# Patient Record
Sex: Female | Born: 1955 | Race: Black or African American | Hispanic: No | Marital: Single | Smoking: Former smoker
Health system: Southern US, Community
[De-identification: ages and names within clinical notes are randomized; demographics above are authoritative.]

## PROBLEM LIST (undated history)

## (undated) DIAGNOSIS — H269 Unspecified cataract: Secondary | ICD-10-CM

## (undated) DIAGNOSIS — D649 Anemia, unspecified: Secondary | ICD-10-CM

## (undated) DIAGNOSIS — T7840XA Allergy, unspecified, initial encounter: Secondary | ICD-10-CM

## (undated) DIAGNOSIS — J302 Other seasonal allergic rhinitis: Secondary | ICD-10-CM

## (undated) DIAGNOSIS — E039 Hypothyroidism, unspecified: Secondary | ICD-10-CM

## (undated) DIAGNOSIS — G709 Myoneural disorder, unspecified: Secondary | ICD-10-CM

## (undated) DIAGNOSIS — R011 Cardiac murmur, unspecified: Secondary | ICD-10-CM

## (undated) HISTORY — DX: Hypothyroidism, unspecified: E03.9

## (undated) HISTORY — PX: BREAST BIOPSY: SHX20

## (undated) HISTORY — DX: Allergy, unspecified, initial encounter: T78.40XA

## (undated) HISTORY — PX: BREAST EXCISIONAL BIOPSY: SUR124

## (undated) HISTORY — DX: Anemia, unspecified: D64.9

## (undated) HISTORY — DX: Myoneural disorder, unspecified: G70.9

## (undated) HISTORY — PX: BREAST LUMPECTOMY: SHX2

## (undated) HISTORY — PX: TONSILLECTOMY: SUR1361

## (undated) HISTORY — DX: Unspecified cataract: H26.9

## (undated) HISTORY — DX: Cardiac murmur, unspecified: R01.1

---

## 1997-10-26 ENCOUNTER — Emergency Department (HOSPITAL_COMMUNITY): Admission: EM | Admit: 1997-10-26 | Discharge: 1997-10-26 | Payer: Self-pay | Admitting: Emergency Medicine

## 1998-10-22 ENCOUNTER — Other Ambulatory Visit: Admission: RE | Admit: 1998-10-22 | Discharge: 1998-10-22 | Payer: Self-pay | Admitting: *Deleted

## 1999-01-06 ENCOUNTER — Other Ambulatory Visit: Admission: RE | Admit: 1999-01-06 | Discharge: 1999-01-06 | Payer: Self-pay | Admitting: *Deleted

## 1999-03-05 ENCOUNTER — Encounter: Payer: Self-pay | Admitting: Family Medicine

## 1999-03-05 ENCOUNTER — Encounter: Admission: RE | Admit: 1999-03-05 | Discharge: 1999-03-05 | Payer: Self-pay | Admitting: Family Medicine

## 1999-03-19 ENCOUNTER — Encounter: Payer: Self-pay | Admitting: Internal Medicine

## 1999-03-19 ENCOUNTER — Encounter: Admission: RE | Admit: 1999-03-19 | Discharge: 1999-03-19 | Payer: Self-pay | Admitting: Internal Medicine

## 1999-04-13 ENCOUNTER — Other Ambulatory Visit: Admission: RE | Admit: 1999-04-13 | Discharge: 1999-04-13 | Payer: Self-pay | Admitting: *Deleted

## 1999-11-01 ENCOUNTER — Emergency Department (HOSPITAL_COMMUNITY): Admission: EM | Admit: 1999-11-01 | Discharge: 1999-11-01 | Payer: Self-pay | Admitting: Emergency Medicine

## 2000-03-09 ENCOUNTER — Encounter: Payer: Self-pay | Admitting: *Deleted

## 2000-03-09 ENCOUNTER — Encounter: Admission: RE | Admit: 2000-03-09 | Discharge: 2000-03-09 | Payer: Self-pay | Admitting: *Deleted

## 2000-03-31 ENCOUNTER — Other Ambulatory Visit: Admission: RE | Admit: 2000-03-31 | Discharge: 2000-03-31 | Payer: Self-pay | Admitting: *Deleted

## 2001-03-20 ENCOUNTER — Encounter: Admission: RE | Admit: 2001-03-20 | Discharge: 2001-03-20 | Payer: Self-pay | Admitting: Internal Medicine

## 2001-03-20 ENCOUNTER — Encounter: Payer: Self-pay | Admitting: Internal Medicine

## 2001-03-29 ENCOUNTER — Encounter: Admission: RE | Admit: 2001-03-29 | Discharge: 2001-03-29 | Payer: Self-pay | Admitting: Internal Medicine

## 2001-03-29 ENCOUNTER — Encounter: Payer: Self-pay | Admitting: Internal Medicine

## 2001-03-31 ENCOUNTER — Encounter: Payer: Self-pay | Admitting: Internal Medicine

## 2001-03-31 ENCOUNTER — Encounter: Admission: RE | Admit: 2001-03-31 | Discharge: 2001-03-31 | Payer: Self-pay | Admitting: Internal Medicine

## 2001-03-31 ENCOUNTER — Encounter (INDEPENDENT_AMBULATORY_CARE_PROVIDER_SITE_OTHER): Payer: Self-pay | Admitting: *Deleted

## 2001-04-06 ENCOUNTER — Other Ambulatory Visit: Admission: RE | Admit: 2001-04-06 | Discharge: 2001-04-06 | Payer: Self-pay | Admitting: *Deleted

## 2001-07-29 ENCOUNTER — Emergency Department (HOSPITAL_COMMUNITY): Admission: EM | Admit: 2001-07-29 | Discharge: 2001-07-29 | Payer: Self-pay | Admitting: Emergency Medicine

## 2002-03-21 ENCOUNTER — Encounter: Admission: RE | Admit: 2002-03-21 | Discharge: 2002-03-21 | Payer: Self-pay | Admitting: Internal Medicine

## 2002-03-21 ENCOUNTER — Encounter: Payer: Self-pay | Admitting: Internal Medicine

## 2002-03-22 ENCOUNTER — Encounter: Admission: RE | Admit: 2002-03-22 | Discharge: 2002-03-22 | Payer: Self-pay | Admitting: Internal Medicine

## 2002-03-22 ENCOUNTER — Encounter: Payer: Self-pay | Admitting: Internal Medicine

## 2003-01-08 ENCOUNTER — Emergency Department (HOSPITAL_COMMUNITY): Admission: EM | Admit: 2003-01-08 | Discharge: 2003-01-08 | Payer: Self-pay

## 2003-03-25 ENCOUNTER — Encounter: Admission: RE | Admit: 2003-03-25 | Discharge: 2003-03-25 | Payer: Self-pay | Admitting: Internal Medicine

## 2004-03-31 ENCOUNTER — Encounter: Admission: RE | Admit: 2004-03-31 | Discharge: 2004-03-31 | Payer: Self-pay | Admitting: Internal Medicine

## 2004-10-14 ENCOUNTER — Encounter: Admission: RE | Admit: 2004-10-14 | Discharge: 2004-10-14 | Payer: Self-pay | Admitting: Orthopedic Surgery

## 2005-04-02 ENCOUNTER — Encounter: Admission: RE | Admit: 2005-04-02 | Discharge: 2005-04-02 | Payer: Self-pay | Admitting: Internal Medicine

## 2005-04-21 ENCOUNTER — Encounter: Admission: RE | Admit: 2005-04-21 | Discharge: 2005-04-21 | Payer: Self-pay | Admitting: Internal Medicine

## 2005-06-19 ENCOUNTER — Emergency Department (HOSPITAL_COMMUNITY): Admission: EM | Admit: 2005-06-19 | Discharge: 2005-06-19 | Payer: Self-pay | Admitting: Family Medicine

## 2006-04-05 ENCOUNTER — Encounter: Admission: RE | Admit: 2006-04-05 | Discharge: 2006-04-05 | Payer: Self-pay | Admitting: Internal Medicine

## 2006-10-04 ENCOUNTER — Emergency Department (HOSPITAL_COMMUNITY): Admission: EM | Admit: 2006-10-04 | Discharge: 2006-10-04 | Payer: Self-pay | Admitting: Emergency Medicine

## 2007-04-27 ENCOUNTER — Encounter: Admission: RE | Admit: 2007-04-27 | Discharge: 2007-04-27 | Payer: Self-pay | Admitting: Internal Medicine

## 2008-04-30 ENCOUNTER — Encounter: Admission: RE | Admit: 2008-04-30 | Discharge: 2008-04-30 | Payer: Self-pay | Admitting: Obstetrics and Gynecology

## 2009-05-01 ENCOUNTER — Encounter: Admission: RE | Admit: 2009-05-01 | Discharge: 2009-05-01 | Payer: Self-pay | Admitting: Internal Medicine

## 2010-05-05 ENCOUNTER — Encounter
Admission: RE | Admit: 2010-05-05 | Discharge: 2010-05-05 | Payer: Self-pay | Source: Home / Self Care | Attending: Internal Medicine | Admitting: Internal Medicine

## 2010-06-06 ENCOUNTER — Encounter: Payer: Self-pay | Admitting: Internal Medicine

## 2011-01-12 ENCOUNTER — Emergency Department (HOSPITAL_COMMUNITY): Payer: Federal, State, Local not specified - PPO

## 2011-01-12 ENCOUNTER — Emergency Department (HOSPITAL_COMMUNITY)
Admission: EM | Admit: 2011-01-12 | Discharge: 2011-01-12 | Disposition: A | Discharge status: Home / Self Care | Payer: Federal, State, Local not specified - PPO | Attending: Emergency Medicine | Admitting: Emergency Medicine

## 2011-01-12 DIAGNOSIS — Z79899 Other long term (current) drug therapy: Secondary | ICD-10-CM | POA: Insufficient documentation

## 2011-01-12 DIAGNOSIS — E039 Hypothyroidism, unspecified: Secondary | ICD-10-CM | POA: Insufficient documentation

## 2011-01-12 DIAGNOSIS — E785 Hyperlipidemia, unspecified: Secondary | ICD-10-CM | POA: Insufficient documentation

## 2011-01-12 DIAGNOSIS — R51 Headache: Secondary | ICD-10-CM | POA: Insufficient documentation

## 2011-01-12 LAB — POCT I-STAT, CHEM 8
BUN: 17 mg/dL (ref 6–23)
Calcium, Ion: 1.15 mmol/L (ref 1.12–1.32)
Chloride: 108 mEq/L (ref 96–112)
Creatinine, Ser: 0.8 mg/dL (ref 0.50–1.10)
Glucose, Bld: 96 mg/dL (ref 70–99)
HCT: 36 % (ref 36.0–46.0)
Hemoglobin: 12.2 g/dL (ref 12.0–15.0)
Potassium: 4.2 mEq/L (ref 3.5–5.1)
Sodium: 142 mEq/L (ref 135–145)
TCO2: 25 mmol/L (ref 0–100)

## 2011-01-12 LAB — SEDIMENTATION RATE: Sed Rate: 22 mm/hr (ref 0–22)

## 2011-04-13 ENCOUNTER — Other Ambulatory Visit: Payer: Self-pay | Admitting: Obstetrics and Gynecology

## 2011-04-13 DIAGNOSIS — Z1231 Encounter for screening mammogram for malignant neoplasm of breast: Secondary | ICD-10-CM

## 2011-05-07 ENCOUNTER — Ambulatory Visit
Admission: RE | Admit: 2011-05-07 | Discharge: 2011-05-07 | Disposition: A | Discharge status: Home / Self Care | Payer: Federal, State, Local not specified - PPO | Source: Ambulatory Visit | Attending: Obstetrics and Gynecology | Admitting: Obstetrics and Gynecology

## 2011-05-07 DIAGNOSIS — Z1231 Encounter for screening mammogram for malignant neoplasm of breast: Secondary | ICD-10-CM

## 2011-12-29 ENCOUNTER — Encounter (HOSPITAL_COMMUNITY): Payer: Self-pay | Admitting: *Deleted

## 2011-12-29 ENCOUNTER — Emergency Department (HOSPITAL_COMMUNITY)
Admission: EM | Admit: 2011-12-29 | Discharge: 2011-12-29 | Disposition: A | Discharge status: Home / Self Care | Payer: Federal, State, Local not specified - PPO | Attending: Emergency Medicine | Admitting: Emergency Medicine

## 2011-12-29 ENCOUNTER — Emergency Department (HOSPITAL_COMMUNITY): Payer: Federal, State, Local not specified - PPO

## 2011-12-29 DIAGNOSIS — K6389 Other specified diseases of intestine: Secondary | ICD-10-CM | POA: Insufficient documentation

## 2011-12-29 DIAGNOSIS — F17200 Nicotine dependence, unspecified, uncomplicated: Secondary | ICD-10-CM | POA: Insufficient documentation

## 2011-12-29 DIAGNOSIS — E079 Disorder of thyroid, unspecified: Secondary | ICD-10-CM | POA: Insufficient documentation

## 2011-12-29 DIAGNOSIS — K639 Disease of intestine, unspecified: Secondary | ICD-10-CM

## 2011-12-29 LAB — URINALYSIS, ROUTINE W REFLEX MICROSCOPIC
Bilirubin Urine: NEGATIVE
Glucose, UA: NEGATIVE mg/dL
Hgb urine dipstick: NEGATIVE
Ketones, ur: NEGATIVE mg/dL
Leukocytes, UA: NEGATIVE
Nitrite: NEGATIVE
Protein, ur: NEGATIVE mg/dL
Specific Gravity, Urine: 1.008 (ref 1.005–1.030)
Urobilinogen, UA: 1 mg/dL (ref 0.0–1.0)
pH: 6 (ref 5.0–8.0)

## 2011-12-29 LAB — BASIC METABOLIC PANEL
BUN: 12 mg/dL (ref 6–23)
CO2: 28 mEq/L (ref 19–32)
Calcium: 9.2 mg/dL (ref 8.4–10.5)
Chloride: 107 mEq/L (ref 96–112)
Creatinine, Ser: 0.68 mg/dL (ref 0.50–1.10)
GFR calc Af Amer: >90 mL/min (ref >90)
GFR calc non Af Amer: >90 mL/min (ref >90)
Glucose, Bld: 99 mg/dL (ref 70–99)
Potassium: 4.1 mEq/L (ref 3.5–5.1)
Sodium: 143 mEq/L (ref 135–145)

## 2011-12-29 LAB — CBC WITH DIFFERENTIAL/PLATELET
Basophils Absolute: 0 10*3/uL (ref 0.0–0.1)
Basophils Relative: 1 % (ref 0–1)
Eosinophils Absolute: 0.3 10*3/uL (ref 0.0–0.7)
Eosinophils Relative: 5 % (ref 0–5)
HCT: 36.2 % (ref 36.0–46.0)
Hemoglobin: 11.9 g/dL — ABNORMAL LOW (ref 12.0–15.0)
Lymphocytes Relative: 25 % (ref 12–46)
Lymphs Abs: 1.4 10*3/uL (ref 0.7–4.0)
MCH: 29 pg (ref 26.0–34.0)
MCHC: 32.9 g/dL (ref 30.0–36.0)
MCV: 88.3 fL (ref 78.0–100.0)
Monocytes Absolute: 0.3 10*3/uL (ref 0.1–1.0)
Monocytes Relative: 6 % (ref 3–12)
Neutro Abs: 3.5 10*3/uL (ref 1.7–7.7)
Neutrophils Relative %: 64 % (ref 43–77)
Platelets: 207 10*3/uL (ref 150–400)
RBC: 4.1 MIL/uL (ref 3.87–5.11)
RDW: 13.2 % (ref 11.5–15.5)
WBC: 5.5 10*3/uL (ref 4.0–10.5)

## 2011-12-29 MED ORDER — MAGNESIUM CITRATE PO SOLN
1.0000 | Freq: Once | ORAL | Status: DC
  Filled 2011-12-29: qty 296

## 2011-12-29 MED ORDER — POLYETHYLENE GLYCOL 3350 17 GM/SCOOP PO POWD: 17.0000 g | Freq: Two times a day (BID) | ORAL | Status: DC

## 2011-12-29 NOTE — ED Notes (Signed)
OOB to restroom

## 2011-12-29 NOTE — ED Notes (Signed)
Patient has urine sample if needed.

## 2011-12-29 NOTE — ED Provider Notes (Signed)
56-year-old female had onset yesterday of episodic left flank pain. Pain is dull and crampy and only present for a few seconds at a time. There is no associated nausea, vomiting, diarrhea. She states that her bowel movements have been regular. She does notice some relief when she passes some flatus. She denies any urinary difficulty. On exam, she has no CVA tenderness, no abdominal tenderness. CBC and urinalysis are unremarkable. KUB will be obtained to assess gas and stool burden, but pain sounds like a splenic flexure syndrome.  Dione Booze, MD 12/29/11 1011

## 2011-12-29 NOTE — ED Notes (Signed)
Patient was shopping yesterday and felt pain in left flank radiating around to left abdomen.  States "passed gas and felt better".  Pain return this am in left flank and around to left abdomen.  Denies Nausea/Vomiting or other symptoms.

## 2011-12-29 NOTE — ED Notes (Signed)
Pt. Was prescribed Mg Citrate. Pt. Refused medication here and wanted to take it at home. PA was notified.

## 2011-12-29 NOTE — ED Notes (Signed)
Pharmacy called. Sending dose.

## 2011-12-29 NOTE — ED Provider Notes (Signed)
History     CSN: [id number]  Arrival date & time 12/29/11  6440   First MD Initiated Contact with Patient 12/29/11 0845      Chief Complaint  Patient presents with  . Flank Pain    (Consider location/radiation/quality/duration/timing/severity/associated sxs/prior treatment) HPI Comments: Anita Duncan 56 y.o. female   The chief complaint is: Patient presents with:   Flank Pain   The patient has medical history significant for:   Past Medical History:   Thyroid disease                                             Patient presents with left flank pain that began yesterday. Patient describes the pain as colicky and 8/10. She states that pain is better after passing gas. Denies fever or chills. Denies NVD. Denies dysuria, urgency, or frequency. Denies vaginal discharge or odor.      Patient is a 56 y.o. female presenting with flank pain. The history is provided by the patient.  Flank Pain Pertinent negatives include no abdominal pain, chest pain, chills, fever, nausea or vomiting.    Past Medical History  Diagnosis Date  . Thyroid disease     Past Surgical History  Procedure Date  . Cesarean section   . Breast lumpectomy     History reviewed. No pertinent family history.  History  Substance Use Topics  . Smoking status: Current Everyday Smoker -- 0.5 packs/day  . Smokeless tobacco: Not on file  . Alcohol Use: 0.6 oz/week    1 Glasses of wine per week     2 x a year    OB History    Grav Para Term Preterm Abortions TAB SAB Ect Mult Living                  Review of Systems  Constitutional: Negative for fever and chills.  Respiratory: Negative for shortness of breath.   Cardiovascular: Negative for chest pain and palpitations.  Gastrointestinal: Negative for nausea, vomiting, abdominal pain and diarrhea.  Genitourinary: Positive for flank pain. Negative for dysuria, urgency, vaginal discharge and difficulty urinating.  All other systems reviewed and  are negative.    Allergies  Codeine and Sulfa antibiotics  Home Medications   Current Outpatient Rx  Name Route Sig Dispense Refill  . LEVOTHYROXINE SODIUM 137 MCG PO TABS Oral Take 137 mcg by mouth daily.    Marland Kitchen NABUMETONE 750 MG PO TABS Oral Take 750 mg by mouth 2 (two) times daily as needed. For pain      BP 131/82  Pulse 80  Temp 97.1 F (36.2 C) (Oral)  Resp 18  SpO2 97%  Physical Exam  Nursing note and vitals reviewed. Constitutional: She appears well-developed and well-nourished. No distress.  HENT:  Head: Normocephalic and atraumatic.  Mouth/Throat: Oropharynx is clear and moist.  Eyes: EOM are normal. No scleral icterus.  Cardiovascular: Normal rate, regular rhythm and normal heart sounds.   Pulmonary/Chest: Effort normal and breath sounds normal.  Abdominal: Soft. Bowel sounds are normal. There is no tenderness. There is CVA tenderness.  Neurological: She is alert.  Skin: Skin is warm and dry.    ED Course  Procedures (including critical care time)  Labs Reviewed  CBC WITH DIFFERENTIAL - Abnormal; Notable for the following:    Hemoglobin 11.9 (*)     All other components within  normal limits  URINALYSIS, ROUTINE W REFLEX MICROSCOPIC - Abnormal; Notable for the following:    APPearance CLOUDY (*)     All other components within normal limits  BASIC METABOLIC PANEL   Results for orders placed during the hospital encounter of 12/29/11  CBC WITH DIFFERENTIAL      Component Value Range   WBC 5.5  4.0 - 10.5 K/uL   RBC 4.10  3.87 - 5.11 MIL/uL   Hemoglobin 11.9 (*) 12.0 - 15.0 g/dL   HCT 62.1  30.8 - 65.7 %   MCV 88.3  78.0 - 100.0 fL   MCH 29.0  26.0 - 34.0 pg   MCHC 32.9  30.0 - 36.0 g/dL   RDW 84.6  96.2 - 95.2 %   Platelets 207  150 - 400 K/uL   Neutrophils Relative 64  43 - 77 %   Neutro Abs 3.5  1.7 - 7.7 K/uL   Lymphocytes Relative 25  12 - 46 %   Lymphs Abs 1.4  0.7 - 4.0 K/uL   Monocytes Relative 6  3 - 12 %   Monocytes Absolute 0.3  0.1 -  1.0 K/uL   Eosinophils Relative 5  0 - 5 %   Eosinophils Absolute 0.3  0.0 - 0.7 K/uL   Basophils Relative 1  0 - 1 %   Basophils Absolute 0.0  0.0 - 0.1 K/uL  URINALYSIS, ROUTINE W REFLEX MICROSCOPIC      Component Value Range   Color, Urine YELLOW  YELLOW   APPearance CLOUDY (*) CLEAR   Specific Gravity, Urine 1.008  1.005 - 1.030   pH 6.0  5.0 - 8.0   Glucose, UA NEGATIVE  NEGATIVE mg/dL   Hgb urine dipstick NEGATIVE  NEGATIVE   Bilirubin Urine NEGATIVE  NEGATIVE   Ketones, ur NEGATIVE  NEGATIVE mg/dL   Protein, ur NEGATIVE  NEGATIVE mg/dL   Urobilinogen, UA 1.0  0.0 - 1.0 mg/dL   Nitrite NEGATIVE  NEGATIVE   Leukocytes, UA NEGATIVE  NEGATIVE  BASIC METABOLIC PANEL      Component Value Range   Sodium 143  135 - 145 mEq/L   Potassium 4.1  3.5 - 5.1 mEq/L   Chloride 107  96 - 112 mEq/L   CO2 28  19 - 32 mEq/L   Glucose, Bld 99  70 - 99 mg/dL   BUN 12  6 - 23 mg/dL   Creatinine, Ser 8.41  0.50 - 1.10 mg/dL   Calcium 9.2  8.4 - 32.4 mg/dL   GFR calc non Af Amer >90  >90 mL/min   GFR calc Af Amer >90  >90 mL/min     Dg Abd Acute W/chest  12/29/2011  *RADIOLOGY REPORT*  Clinical Data: Flank pain  ACUTE ABDOMEN SERIES (ABDOMEN 2 VIEW & CHEST 1 VIEW)  Comparison: 10/04/2006  Findings: Cardiomediastinal silhouette is stable.  No acute infiltrate or pleural effusion.  No pulmonary edema.  There is nonspecific nonobstructive bowel gas pattern.  Mild lumbar dextroscoliosis.  Some colonic stool noted in the transverse and descending colon.  No free abdominal air.  No pathologic calcifications are noted.  IMPRESSION: No acute disease.  Mild lumbar dextroscoliosis.  Nonspecific nonobstructive bowel gas pattern.  No free abdominal air.  Original Report Authenticated By: Natasha Mead, M.D.     1. Splenic flexure syndrome       MDM  Patient presented with left sided flank pain that began today. UA, CBC, BMP: unremarkable. Acute abdomen series remarkable for  large amounts of stool and  air.Patient will be given magnesium citrate and informed that it can cause diarrhea. Patient will be given recommendations on increasing fiber to diet. No red flags for pyelonephritis, nephrolithiasis, or other more serious abdominal etiology. Patient given return precautions verbally and in discharge instructions.         Pixie Casino, PA-C 12/29/11 1551

## 2011-12-30 NOTE — ED Provider Notes (Signed)
Medical screening examination/treatment/procedure(s) were conducted as a shared visit with non-physician practitioner(s) and myself.  I personally evaluated the patient during the encounter   Dione Booze, MD 12/30/11 [phone]

## 2011-12-31 ENCOUNTER — Emergency Department (HOSPITAL_COMMUNITY)
Admission: EM | Admit: 2011-12-31 | Discharge: 2011-12-31 | Disposition: A | Discharge status: Home / Self Care | Payer: Federal, State, Local not specified - PPO | Attending: Emergency Medicine | Admitting: Emergency Medicine

## 2011-12-31 ENCOUNTER — Encounter (HOSPITAL_COMMUNITY): Payer: Self-pay | Admitting: Family Medicine

## 2011-12-31 DIAGNOSIS — F17200 Nicotine dependence, unspecified, uncomplicated: Secondary | ICD-10-CM | POA: Insufficient documentation

## 2011-12-31 DIAGNOSIS — R109 Unspecified abdominal pain: Secondary | ICD-10-CM | POA: Insufficient documentation

## 2011-12-31 DIAGNOSIS — Z885 Allergy status to narcotic agent status: Secondary | ICD-10-CM | POA: Insufficient documentation

## 2011-12-31 DIAGNOSIS — K5900 Constipation, unspecified: Secondary | ICD-10-CM

## 2011-12-31 DIAGNOSIS — E039 Hypothyroidism, unspecified: Secondary | ICD-10-CM | POA: Insufficient documentation

## 2011-12-31 DIAGNOSIS — Z882 Allergy status to sulfonamides status: Secondary | ICD-10-CM | POA: Insufficient documentation

## 2011-12-31 LAB — CBC WITH DIFFERENTIAL/PLATELET
Basophils Absolute: 0 10*3/uL (ref 0.0–0.1)
Basophils Relative: 0 % (ref 0–1)
Eosinophils Absolute: 0.2 10*3/uL (ref 0.0–0.7)
Eosinophils Relative: 2 % (ref 0–5)
HCT: 38.8 % (ref 36.0–46.0)
Hemoglobin: 12.9 g/dL (ref 12.0–15.0)
Lymphocytes Relative: 27 % (ref 12–46)
Lymphs Abs: 2.2 10*3/uL (ref 0.7–4.0)
MCH: 29.5 pg (ref 26.0–34.0)
MCHC: 33.2 g/dL (ref 30.0–36.0)
MCV: 88.8 fL (ref 78.0–100.0)
Monocytes Absolute: 0.4 10*3/uL (ref 0.1–1.0)
Monocytes Relative: 4 % (ref 3–12)
Neutro Abs: 5.5 10*3/uL (ref 1.7–7.7)
Neutrophils Relative %: 67 % (ref 43–77)
Platelets: 237 10*3/uL (ref 150–400)
RBC: 4.37 MIL/uL (ref 3.87–5.11)
RDW: 13 % (ref 11.5–15.5)
WBC: 8.2 10*3/uL (ref 4.0–10.5)

## 2011-12-31 LAB — COMPREHENSIVE METABOLIC PANEL
ALT: 9 U/L (ref 0–35)
AST: 15 U/L (ref 0–37)
Albumin: 4.1 g/dL (ref 3.5–5.2)
Alkaline Phosphatase: 92 U/L (ref 39–117)
BUN: 10 mg/dL (ref 6–23)
CO2: 28 mEq/L (ref 19–32)
Calcium: 9.5 mg/dL (ref 8.4–10.5)
Chloride: 99 mEq/L (ref 96–112)
Creatinine, Ser: 0.69 mg/dL (ref 0.50–1.10)
GFR calc Af Amer: >90 mL/min (ref >90)
GFR calc non Af Amer: >90 mL/min (ref >90)
Glucose, Bld: 88 mg/dL (ref 70–99)
Potassium: 4.1 mEq/L (ref 3.5–5.1)
Sodium: 137 mEq/L (ref 135–145)
Total Bilirubin: 0.4 mg/dL (ref 0.3–1.2)
Total Protein: 7.7 g/dL (ref 6.0–8.3)

## 2011-12-31 LAB — URINALYSIS, ROUTINE W REFLEX MICROSCOPIC
Bilirubin Urine: NEGATIVE
Glucose, UA: NEGATIVE mg/dL
Hgb urine dipstick: NEGATIVE
Leukocytes, UA: NEGATIVE
Nitrite: NEGATIVE
Protein, ur: NEGATIVE mg/dL
Specific Gravity, Urine: 1.018 (ref 1.005–1.030)
Urobilinogen, UA: 0.2 mg/dL (ref 0.0–1.0)
pH: 6.5 (ref 5.0–8.0)

## 2011-12-31 LAB — OCCULT BLOOD, POC DEVICE: Fecal Occult Bld: NEGATIVE

## 2011-12-31 LAB — LIPASE, BLOOD: Lipase: 24 U/L (ref 11–59)

## 2011-12-31 MED ORDER — SODIUM CHLORIDE 0.9 % IV BOLUS (SEPSIS)
1000.0000 mL | Freq: Once | INTRAVENOUS | Status: AC
  Administered 2011-12-31: 1000 mL via INTRAVENOUS

## 2011-12-31 MED ORDER — METOCLOPRAMIDE HCL 5 MG/ML IJ SOLN
10.0000 mg | Freq: Once | INTRAMUSCULAR | Status: AC
  Administered 2011-12-31: 10 mg via INTRAVENOUS
  Filled 2011-12-31: qty 2

## 2011-12-31 NOTE — ED Notes (Signed)
Pt was seen by [facility] on 8/14 and diagnosed with a form of IBS and abdominal spasms. Pt reports spasms have been going on all day, but are intermittent. Pt has tried magnesium citrate on 8/14 and miralax without relief.  Pt reports she is able to pass gas, but can not have a BM. Pt also tried a suppository and fleets enema without relief.   Pt reports last BM was Tuesday evening.  Pt reports she is typically regular with a BM everyday.  Pt reports she needs pain management for spasms and assistance with having a BM.  Pt denies N/V.    Pt denies changes in medications or activity recently.

## 2011-12-31 NOTE — ED Notes (Signed)
Patient changing into a gown. Pt given a warm blanket

## 2011-12-31 NOTE — ED Provider Notes (Signed)
Medical screening examination/treatment/procedure(s) were performed by non-physician practitioner and as supervising physician I was immediately available for consultation/collaboration.  Doug Sou, MD 12/31/11 2320

## 2011-12-31 NOTE — ED Provider Notes (Signed)
History     CSN: [id number]  Arrival date & time 12/31/11  1651   First MD Initiated Contact with Patient 12/31/11 2007      Chief Complaint  Patient presents with  . Constipation  . Flank Pain   HPI  History provided by the patient. Patient is a 56 year old female with history of hypothyroidism, cesarean section who presents with complaints of constipation with intermittent left side and abdominal pain and cramping. Patient reports being constipated for the past 3-4 days. She was seen for similar symptoms 2 days ago in the emergency room. She had unremarkable lab testing and a plain film x-ray showing stool throughout the ascending and transverse colon. She states she's been using several stool softeners, suppositories and fleets enema without any improvement. She denies any rectal pain or fullness or any blood in the stool. Pain is intermittent and feels like cramping. She has not taken any other medications for symptoms. She denies any fever, chills, sweats, abdominal distention, nausea or vomiting. Patient had a colonoscopy several years ago with Dr. Elnoria Howard and reports having no polyps or diverticula.     Past Medical History  Diagnosis Date  . Thyroid disease     Past Surgical History  Procedure Date  . Cesarean section   . Breast lumpectomy     History reviewed. No pertinent family history.  History  Substance Use Topics  . Smoking status: Current Everyday Smoker -- 0.5 packs/day  . Smokeless tobacco: Not on file  . Alcohol Use: 0.6 oz/week    1 Glasses of wine per week     2 x a year    OB History    Grav Para Term Preterm Abortions TAB SAB Ect Mult Living                  Review of Systems  Constitutional: Negative for fever, chills and appetite change.  Respiratory: Negative for cough and shortness of breath.   Cardiovascular: Negative for chest pain and palpitations.  Gastrointestinal: Positive for abdominal pain and constipation. Negative for nausea,  vomiting, diarrhea and blood in stool.  Genitourinary: Positive for flank pain. Negative for dysuria, frequency, hematuria, vaginal bleeding and vaginal discharge.    Allergies  Codeine and Sulfa antibiotics  Home Medications   Current Outpatient Rx  Name Route Sig Dispense Refill  . RISAQUAD PO CAPS Oral Take 1 capsule by mouth once.    Marland Kitchen BISACODYL 10 MG RE SUPP Rectal Place 10 mg rectally once.    Marland Kitchen LEVOTHYROXINE SODIUM 137 MCG PO TABS Oral Take 137 mcg by mouth daily.    Marland Kitchen MAGNESIUM CITRATE PO SOLN Oral Take 1 Bottle by mouth once.    Marland Kitchen NABUMETONE 750 MG PO TABS Oral Take 750 mg by mouth 2 (two) times daily as needed. For pain    . POLYETHYLENE GLYCOL 3350 PO POWD Oral Take 17 g by mouth 2 (two) times daily. Until daily soft stools  OTC    . DISPOSABLE ENEMA 19-7 GM/118ML RE ENEM Rectal Place 1 enema rectally once. follow package directions      BP 155/90  Pulse 81  Temp 98.6 F (37 C) (Oral)  Resp 20  Wt 170 lb (77.111 kg)  SpO2 100%  Physical Exam  Nursing note and vitals reviewed. Constitutional: She is oriented to person, place, and time. She appears well-developed and well-nourished. No distress.  HENT:  Head: Normocephalic.  Cardiovascular: Normal rate and regular rhythm.   No murmur heard. Pulmonary/Chest: Effort  normal and breath sounds normal. No respiratory distress. She has no wheezes. She has no rales.  Abdominal: Soft. There is tenderness in the left lower quadrant. There is no rebound, no guarding and no CVA tenderness.       Very mild left lower quadrant tenderness. No masses.  Genitourinary:       Chaperone was present. No stool in rectal vault or fecal impaction. No gross blood. No tenderness.  Neurological: She is alert and oriented to person, place, and time.  Skin: Skin is warm and dry.  Psychiatric: She has a normal mood and affect. Her behavior is normal.    ED Course  Procedures   Results for orders placed during the hospital encounter of  12/31/11  URINALYSIS, ROUTINE W REFLEX MICROSCOPIC      Component Value Range   Color, Urine YELLOW  YELLOW   APPearance CLEAR  CLEAR   Specific Gravity, Urine 1.018  1.005 - 1.030   pH 6.5  5.0 - 8.0   Glucose, UA NEGATIVE  NEGATIVE mg/dL   Hgb urine dipstick NEGATIVE  NEGATIVE   Bilirubin Urine NEGATIVE  NEGATIVE   Ketones, ur TRACE (*) NEGATIVE mg/dL   Protein, ur NEGATIVE  NEGATIVE mg/dL   Urobilinogen, UA 0.2  0.0 - 1.0 mg/dL   Nitrite NEGATIVE  NEGATIVE   Leukocytes, UA NEGATIVE  NEGATIVE  CBC WITH DIFFERENTIAL      Component Value Range   WBC 8.2  4.0 - 10.5 K/uL   RBC 4.37  3.87 - 5.11 MIL/uL   Hemoglobin 12.9  12.0 - 15.0 g/dL   HCT 04.5  40.9 - 81.1 %   MCV 88.8  78.0 - 100.0 fL   MCH 29.5  26.0 - 34.0 pg   MCHC 33.2  30.0 - 36.0 g/dL   RDW 91.4  78.2 - 95.6 %   Platelets 237  150 - 400 K/uL   Neutrophils Relative 67  43 - 77 %   Neutro Abs 5.5  1.7 - 7.7 K/uL   Lymphocytes Relative 27  12 - 46 %   Lymphs Abs 2.2  0.7 - 4.0 K/uL   Monocytes Relative 4  3 - 12 %   Monocytes Absolute 0.4  0.1 - 1.0 K/uL   Eosinophils Relative 2  0 - 5 %   Eosinophils Absolute 0.2  0.0 - 0.7 K/uL   Basophils Relative 0  0 - 1 %   Basophils Absolute 0.0  0.0 - 0.1 K/uL  COMPREHENSIVE METABOLIC PANEL      Component Value Range   Sodium 137  135 - 145 mEq/L   Potassium 4.1  3.5 - 5.1 mEq/L   Chloride 99  96 - 112 mEq/L   CO2 28  19 - 32 mEq/L   Glucose, Bld 88  70 - 99 mg/dL   BUN 10  6 - 23 mg/dL   Creatinine, Ser 2.13  0.50 - 1.10 mg/dL   Calcium 9.5  8.4 - 08.6 mg/dL   Total Protein 7.7  6.0 - 8.3 g/dL   Albumin 4.1  3.5 - 5.2 g/dL   AST 15  0 - 37 U/L   ALT 9  0 - 35 U/L   Alkaline Phosphatase 92  39 - 117 U/L   Total Bilirubin 0.4  0.3 - 1.2 mg/dL   GFR calc non Af Amer >90  >90 mL/min   GFR calc Af Amer >90  >90 mL/min  LIPASE, BLOOD      Component  Value Range   Lipase 24  11 - 59 U/L  OCCULT BLOOD, POC DEVICE      Component Value Range   Fecal Occult Bld NEGATIVE          1. Constipation       MDM  8:10PM patient seen and evaluated. Patient resting comfortably in no acute distress. No tenderness at this time.  No blood in the urine. No signs of a UTI or concerns for pyelonephritis at this time. No CVA tenderness. Doubt kidney stone. Rectal exam unremarkable. No fecal impaction. Hemoccult is negative.  Patient continues to feel well without any symptoms of pain. She has been advised to continue MiraLAX and followup with PCP and GI specialist. She agrees with this plan.     Angus Seller, [place] 12/31/11 2153

## 2011-12-31 NOTE — ED Notes (Signed)
Pt reports lbm x4 days ago. States constipation and left flank pain causing her to be unable to sleep due to discomfort.

## 2012-03-30 ENCOUNTER — Other Ambulatory Visit: Payer: Self-pay | Admitting: Obstetrics and Gynecology

## 2012-03-30 DIAGNOSIS — Z1231 Encounter for screening mammogram for malignant neoplasm of breast: Secondary | ICD-10-CM

## 2012-05-15 ENCOUNTER — Ambulatory Visit
Admission: RE | Admit: 2012-05-15 | Discharge: 2012-05-15 | Disposition: A | Discharge status: Home / Self Care | Payer: Federal, State, Local not specified - PPO | Source: Ambulatory Visit | Attending: Obstetrics and Gynecology | Admitting: Obstetrics and Gynecology

## 2012-05-15 DIAGNOSIS — Z1231 Encounter for screening mammogram for malignant neoplasm of breast: Secondary | ICD-10-CM

## 2013-04-18 ENCOUNTER — Other Ambulatory Visit: Payer: Self-pay

## 2013-04-18 DIAGNOSIS — Z1231 Encounter for screening mammogram for malignant neoplasm of breast: Secondary | ICD-10-CM

## 2013-05-28 ENCOUNTER — Ambulatory Visit
Admission: RE | Admit: 2013-05-28 | Discharge: 2013-05-28 | Disposition: A | Discharge status: Home / Self Care | Payer: Federal, State, Local not specified - PPO | Source: Ambulatory Visit

## 2013-05-28 DIAGNOSIS — Z1231 Encounter for screening mammogram for malignant neoplasm of breast: Secondary | ICD-10-CM

## 2014-04-22 ENCOUNTER — Other Ambulatory Visit: Payer: Self-pay

## 2014-04-22 DIAGNOSIS — Z1231 Encounter for screening mammogram for malignant neoplasm of breast: Secondary | ICD-10-CM

## 2014-05-29 ENCOUNTER — Ambulatory Visit
Admission: RE | Admit: 2014-05-29 | Discharge: 2014-05-29 | Disposition: A | Discharge status: Home / Self Care | Payer: Federal, State, Local not specified - PPO | Source: Ambulatory Visit

## 2014-05-29 DIAGNOSIS — Z1231 Encounter for screening mammogram for malignant neoplasm of breast: Secondary | ICD-10-CM

## 2015-04-22 ENCOUNTER — Other Ambulatory Visit: Payer: Self-pay

## 2015-04-22 DIAGNOSIS — Z1231 Encounter for screening mammogram for malignant neoplasm of breast: Secondary | ICD-10-CM

## 2015-06-03 ENCOUNTER — Ambulatory Visit
Admission: RE | Admit: 2015-06-03 | Discharge: 2015-06-03 | Disposition: A | Discharge status: Home / Self Care | Payer: Federal, State, Local not specified - PPO | Source: Ambulatory Visit

## 2015-06-03 DIAGNOSIS — Z1231 Encounter for screening mammogram for malignant neoplasm of breast: Secondary | ICD-10-CM

## 2015-08-21 DIAGNOSIS — Z1211 Encounter for screening for malignant neoplasm of colon: Secondary | ICD-10-CM | POA: Diagnosis not present

## 2015-11-06 DIAGNOSIS — Z0000 Encounter for general adult medical examination without abnormal findings: Secondary | ICD-10-CM | POA: Diagnosis not present

## 2015-11-06 DIAGNOSIS — E039 Hypothyroidism, unspecified: Secondary | ICD-10-CM | POA: Diagnosis not present

## 2015-11-06 DIAGNOSIS — Z6833 Body mass index (BMI) 33.0-33.9, adult: Secondary | ICD-10-CM | POA: Diagnosis not present

## 2015-11-06 DIAGNOSIS — J301 Allergic rhinitis due to pollen: Secondary | ICD-10-CM | POA: Diagnosis not present

## 2015-11-07 DIAGNOSIS — Z6832 Body mass index (BMI) 32.0-32.9, adult: Secondary | ICD-10-CM | POA: Diagnosis not present

## 2015-11-07 DIAGNOSIS — Z01419 Encounter for gynecological examination (general) (routine) without abnormal findings: Secondary | ICD-10-CM | POA: Diagnosis not present

## 2015-12-17 DIAGNOSIS — J019 Acute sinusitis, unspecified: Secondary | ICD-10-CM | POA: Diagnosis not present

## 2015-12-17 DIAGNOSIS — Z720 Tobacco use: Secondary | ICD-10-CM | POA: Diagnosis not present

## 2015-12-17 DIAGNOSIS — R05 Cough: Secondary | ICD-10-CM | POA: Diagnosis not present

## 2015-12-24 ENCOUNTER — Ambulatory Visit (HOSPITAL_COMMUNITY)
Admission: EM | Admit: 2015-12-24 | Discharge: 2015-12-24 | Disposition: A | Discharge status: Home / Self Care | Payer: Federal, State, Local not specified - PPO | Attending: Family Medicine | Admitting: Family Medicine

## 2015-12-24 ENCOUNTER — Encounter (HOSPITAL_COMMUNITY): Payer: Self-pay | Admitting: Emergency Medicine

## 2015-12-24 DIAGNOSIS — J300 Vasomotor rhinitis: Secondary | ICD-10-CM

## 2015-12-24 MED ORDER — BENZONATATE 200 MG PO CAPS: 200.0000 mg | ORAL_CAPSULE | Freq: Three times a day (TID) | ORAL | 0 refills | Status: DC

## 2015-12-24 MED ORDER — IPRATROPIUM BROMIDE 0.06 % NA SOLN: 2.0000 | Freq: Four times a day (QID) | NASAL | 1 refills | Status: DC

## 2015-12-24 NOTE — ED Triage Notes (Signed)
Coughing "attacks", fever and chills.  Onset last week.  Patient did see pcp 8/2.  No cigarettes since 7/31.

## 2015-12-24 NOTE — ED Provider Notes (Signed)
[facility]    CSN: [id number] Arrival date & time: 12/24/15  [id number]  First Provider Contact:  First MD Initiated Contact with Patient 12/24/15 2056        History   Chief Complaint No chief complaint on file.   HPI Anita Duncan is a 60 y.o. female.    Cough  Cough characteristics:  Dry, non-productive and hacking Severity:  Mild Onset quality:  Gradual Duration:  3 days Progression:  Unchanged Chronicity:  New Smoker: no   Context: upper respiratory infection   Context comment:  Seen by lmd yest and given z-pack, concern for poss cap. pt reassured. Associated symptoms: rhinorrhea   Associated symptoms: no fever, no shortness of breath and no wheezing     Past Medical History:  Diagnosis Date  . Thyroid disease     There are no active problems to display for this patient.   Past Surgical History:  Procedure Laterality Date  . BREAST LUMPECTOMY    . CESAREAN SECTION      OB History    No data available       Home Medications    Prior to Admission medications   Medication Sig Start Date End Date Taking? Authorizing Provider  acidophilus (RISAQUAD) CAPS Take 1 capsule by mouth once.    Historical Provider, MD  bisacodyl (DULCOLAX) 10 MG suppository Place 10 mg rectally once.    Historical Provider, MD  levothyroxine (SYNTHROID, LEVOTHROID) 137 MCG tablet Take 137 mcg by mouth daily.    Historical Provider, MD  magnesium citrate SOLN Take 1 Bottle by mouth once.    Historical Provider, MD  nabumetone (RELAFEN) 750 MG tablet Take 750 mg by mouth 2 (two) times daily as needed. For pain    Historical Provider, MD  sodium phosphate (FLEET) enema Place 1 enema rectally once. follow package directions    Historical Provider, MD    Family History No family history on file.  Social History Social History  Substance Use Topics  . Smoking status: Current Every Day Smoker    Packs/day: 0.50  . Smokeless tobacco: Not on file  . Alcohol use 0.6  oz/week    1 Glasses of wine per week     Comment: 2 x a year     Allergies   Codeine and Sulfa antibiotics   Review of Systems Review of Systems  Constitutional: Negative.  Negative for fever.  HENT: Positive for congestion, postnasal drip and rhinorrhea.   Respiratory: Positive for cough. Negative for shortness of breath and wheezing.   Cardiovascular: Negative.   All other systems reviewed and are negative.    Physical Exam Triage Vital Signs ED Triage Vitals  Enc Vitals Group     BP      Pulse      Resp      Temp      Temp src      SpO2      Weight      Height      Head Circumference      Peak Flow      Pain Score      Pain Loc      Pain Edu?      Excl. in GC?    No data found.   Updated Vital Signs There were no vitals taken for this visit.  Visual Acuity Right Eye Distance:   Left Eye Distance:   Bilateral Distance:    Right Eye Near:   Left Eye  Near:    Bilateral Near:     Physical Exam  Constitutional: She appears well-developed and well-nourished. No distress.  HENT:  Right Ear: External ear normal.  Left Ear: External ear normal.  Mouth/Throat: Oropharynx is clear and moist.  Eyes: Conjunctivae and EOM are normal. Pupils are equal, round, and reactive to light.  Neck: Normal range of motion. Neck supple.  Cardiovascular: Normal rate, regular rhythm, normal heart sounds and intact distal pulses.   Pulmonary/Chest: Effort normal and breath sounds normal. No respiratory distress. She has no wheezes.  Skin: Skin is warm and dry.  Nursing note and vitals reviewed.    UC Treatments / Results  Labs (all labs ordered are listed, but only abnormal results are displayed) Labs Reviewed - No data to display  EKG  EKG Interpretation None       Radiology No results found.  Procedures Procedures (including critical care time)  Medications Ordered in UC Medications - No data to display   Initial Impression / Assessment and Plan / UC  Course  I have reviewed the triage vital signs and the nursing notes.  Pertinent labs & imaging results that were available during my care of the patient were reviewed by me and considered in my medical decision making (see chart for details).  Clinical Course      Final Clinical Impressions(s) / UC Diagnoses   Final diagnoses:  None    New Prescriptions New Prescriptions   No medications on file     Linna Hoff, MD 12/24/15 2112

## 2016-01-05 DIAGNOSIS — E039 Hypothyroidism, unspecified: Secondary | ICD-10-CM | POA: Diagnosis not present

## 2016-02-13 DIAGNOSIS — H401131 Primary open-angle glaucoma, bilateral, mild stage: Secondary | ICD-10-CM | POA: Diagnosis not present

## 2016-04-26 ENCOUNTER — Other Ambulatory Visit: Payer: Self-pay | Admitting: Obstetrics and Gynecology

## 2016-04-26 DIAGNOSIS — Z1231 Encounter for screening mammogram for malignant neoplasm of breast: Secondary | ICD-10-CM

## 2016-05-03 DIAGNOSIS — E039 Hypothyroidism, unspecified: Secondary | ICD-10-CM | POA: Diagnosis not present

## 2016-05-03 DIAGNOSIS — F17200 Nicotine dependence, unspecified, uncomplicated: Secondary | ICD-10-CM | POA: Diagnosis not present

## 2016-05-03 DIAGNOSIS — R7309 Other abnormal glucose: Secondary | ICD-10-CM | POA: Diagnosis not present

## 2016-05-03 DIAGNOSIS — Z6833 Body mass index (BMI) 33.0-33.9, adult: Secondary | ICD-10-CM | POA: Diagnosis not present

## 2016-05-03 DIAGNOSIS — Z1389 Encounter for screening for other disorder: Secondary | ICD-10-CM | POA: Diagnosis not present

## 2016-06-07 ENCOUNTER — Ambulatory Visit
Admission: RE | Admit: 2016-06-07 | Discharge: 2016-06-07 | Disposition: A | Discharge status: Home / Self Care | Payer: Federal, State, Local not specified - PPO | Source: Ambulatory Visit | Attending: Obstetrics and Gynecology | Admitting: Obstetrics and Gynecology

## 2016-06-07 ENCOUNTER — Telehealth (INDEPENDENT_AMBULATORY_CARE_PROVIDER_SITE_OTHER): Payer: Self-pay | Admitting: Orthopedic Surgery

## 2016-06-07 DIAGNOSIS — Z1231 Encounter for screening mammogram for malignant neoplasm of breast: Secondary | ICD-10-CM

## 2016-06-07 DIAGNOSIS — H40003 Preglaucoma, unspecified, bilateral: Secondary | ICD-10-CM | POA: Diagnosis not present

## 2016-06-07 MED ORDER — NABUMETONE 750 MG PO TABS: 750.0000 mg | ORAL_TABLET | Freq: Two times a day (BID) | ORAL | 0 refills | Status: DC | PRN

## 2016-06-07 NOTE — Telephone Encounter (Signed)
Rx refill Nabumetone, [facility] on [address]

## 2016-06-07 NOTE — Telephone Encounter (Signed)
Ok refill? 

## 2016-06-09 NOTE — Telephone Encounter (Signed)
Rx sent to patient pharmacy.

## 2016-06-21 ENCOUNTER — Ambulatory Visit (INDEPENDENT_AMBULATORY_CARE_PROVIDER_SITE_OTHER): Admitting: Orthopedic Surgery

## 2016-06-21 DIAGNOSIS — M7541 Impingement syndrome of right shoulder: Secondary | ICD-10-CM | POA: Diagnosis not present

## 2016-06-21 NOTE — Progress Notes (Signed)
   Office Visit Note   Patient: Anita PottersBrenda A Davisson           Date of Birth: 06/06/1955           MRN: [id number] Visit Date: 06/21/2016              Requested by: Dorothyann Pengobyn Sanders, MD [address] [place], [place] [id number] PCP: Gwynneth Alimentobyn N Sanders, MD  No chief complaint on file.   HPI: Patient last in office 12/22/15 for bilateral shoulder pain and needing to have her work restrictions updated for her work note with the [employer].  She states that she is still having pain that travels down her biceps tendon. Patient states that if she is able to have her paperwork updated then she will not have to work on certain machines at work that are repetitive in movement. Rodena MedinAutumn L Forrest, RMA    Assessment & Plan: Visit Diagnoses:  1. 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 She is to do no overhead work no pulling or pushing no repetitive work with her arms.  Follow-Up Instructions: Return if symptoms worsen or fail to improve.   Ortho Exam On examination patient is alert oriented no adenopathy well-dressed normal affect normal respiratory effort she has a normal gait. She has decreased range of motion of both shoulders she has extreme pain to palpation over the biceps tendon on the right she has pain with Neer and Hawkins impingement test.  Imaging: No results found.  Orders:  No orders of the defined types were placed in this encounter.  No orders of the defined types were placed in this encounter.    Procedures: No procedures performed  Clinical Data: No additional findings.  Subjective: Review of Systems  Objective: Vital Signs: There were no vitals taken for this visit.  Specialty Comments:  No specialty comments available.  PMFS History: Patient Active Problem List   Diagnosis Date Noted  . Impingement syndrome of right shoulder 06/21/2016   Past Medical History:  Diagnosis Date  . Thyroid  disease     No family history on file.  Past Surgical History:  Procedure Laterality Date  . BREAST LUMPECTOMY    . CESAREAN SECTION     Social History   Occupational History  . Not on file.   Social History Main Topics  . Smoking status: Current Every Day Smoker    Packs/day: 0.50  . Smokeless tobacco: Not on file  . Alcohol use 0.6 oz/week    1 Glasses of wine per week     Comment: 2 x a year  . Drug use: No  . Sexual activity: Yes

## 2016-07-06 ENCOUNTER — Other Ambulatory Visit (INDEPENDENT_AMBULATORY_CARE_PROVIDER_SITE_OTHER): Payer: Self-pay | Admitting: Orthopedic Surgery

## 2016-09-16 DIAGNOSIS — H40003 Preglaucoma, unspecified, bilateral: Secondary | ICD-10-CM | POA: Diagnosis not present

## 2016-10-04 ENCOUNTER — Encounter (INDEPENDENT_AMBULATORY_CARE_PROVIDER_SITE_OTHER): Payer: Self-pay

## 2016-10-04 ENCOUNTER — Ambulatory Visit (INDEPENDENT_AMBULATORY_CARE_PROVIDER_SITE_OTHER): Admitting: Orthopedic Surgery

## 2016-10-04 ENCOUNTER — Encounter (INDEPENDENT_AMBULATORY_CARE_PROVIDER_SITE_OTHER): Payer: Self-pay | Admitting: Orthopedic Surgery

## 2016-10-04 VITALS — Wt 170.0 lb

## 2016-10-04 DIAGNOSIS — M7541 Impingement syndrome of right shoulder: Secondary | ICD-10-CM

## 2016-10-04 NOTE — Progress Notes (Signed)
   Office Visit Note   Patient: Anita Duncan           Date of Birth: 05/15/1956           MRN: [id number] Visit Date: 10/04/2016              Requested by: Anita Duncan, Robyn, MD [address] [place], [place] [id number] PCP: Anita Duncan, Robyn, MD  No chief complaint on file.     HPI:  patient presents for follow-up for impingement syndrome of her right shoulder she states that the work restrictions are helping and she has not had flareups recently with her current work restrictions. Patient states that she is not diabetic but that she is prediabetic. She states she occasionally has some lower back pain which radiates down the right buttocks down the lateral aspect of the right leg she states this has been relieved by taking one Relafen a day.  Assessment & Plan: Visit Diagnoses:  1. Impingement syndrome of right shoulder     Plan:  Patient's CA 17 was completed she is to continue her work restrictions of maximum lifting 5 pounds no pulling out fishing no climbing and overhead activities. Patient may rest as needed.  Follow-Up Instructions: Return if symptoms worsen or fail to improve.   Ortho Exam  Patient is alert, oriented, no adenopathy, well-dressed, normal affect, normal respiratory effort.  examination patient has full active and passive range of motion of both shoulders she is asymptomatic motion. Patient has no motor weakness in either lower extremity no radicular pain at this time.  Imaging: No results found.  Labs: Lab Results  Component Value Date   ESRSEDRATE 22 01/12/2011    Orders:  No orders of the defined types were placed in this encounter.  No orders of the defined types were placed in this encounter.    Procedures: No procedures performed  Clinical Data: No additional findings.  ROS:  All other systems negative, except as noted in the HPI. Review of Systems  Objective: Vital Signs: There were no vitals taken for this  visit.  Specialty Comments:  No specialty comments available.  PMFS History: Patient Active Problem List   Diagnosis Date Noted  . Impingement syndrome of right shoulder 06/21/2016   Past Medical History:  Diagnosis Date  . Thyroid disease     No family history on file.  Past Surgical History:  Procedure Laterality Date  . BREAST LUMPECTOMY    . CESAREAN SECTION     Social History   Occupational History  . Not on file.   Social History Main Topics  . Smoking status: Current Every Day Smoker    Packs/day: 0.50  . Smokeless tobacco: Not on file  . Alcohol use 0.6 oz/week    1 Glasses of wine per week     Comment: 2 x a year  . Drug use: No  . Sexual activity: Yes

## 2016-10-05 ENCOUNTER — Telehealth (INDEPENDENT_AMBULATORY_CARE_PROVIDER_SITE_OTHER): Payer: Self-pay | Admitting: Orthopedic Surgery

## 2016-10-05 NOTE — Telephone Encounter (Signed)
Copy of all records mailed to patient

## 2016-11-08 DIAGNOSIS — Z6830 Body mass index (BMI) 30.0-30.9, adult: Secondary | ICD-10-CM | POA: Diagnosis not present

## 2016-11-08 DIAGNOSIS — Z01419 Encounter for gynecological examination (general) (routine) without abnormal findings: Secondary | ICD-10-CM | POA: Diagnosis not present

## 2016-12-27 DIAGNOSIS — Z0000 Encounter for general adult medical examination without abnormal findings: Secondary | ICD-10-CM | POA: Diagnosis not present

## 2017-01-24 DIAGNOSIS — H1013 Acute atopic conjunctivitis, bilateral: Secondary | ICD-10-CM | POA: Diagnosis not present

## 2017-02-21 DIAGNOSIS — E039 Hypothyroidism, unspecified: Secondary | ICD-10-CM | POA: Diagnosis not present

## 2017-03-22 ENCOUNTER — Encounter (INDEPENDENT_AMBULATORY_CARE_PROVIDER_SITE_OTHER): Payer: Self-pay | Admitting: Orthopedic Surgery

## 2017-03-22 ENCOUNTER — Ambulatory Visit (INDEPENDENT_AMBULATORY_CARE_PROVIDER_SITE_OTHER): Admitting: Orthopedic Surgery

## 2017-03-22 DIAGNOSIS — M7541 Impingement syndrome of right shoulder: Secondary | ICD-10-CM

## 2017-03-22 NOTE — Progress Notes (Signed)
Office Visit Note   Patient: Anita Duncan           Date of Birth: 08/17/1955           MRN: [id number] Visit Date: 03/22/2017              Requested by: Dorothyann PengSanders, Robyn, MD [address] [place], [place] [id number] PCP: Dorothyann PengSanders, Robyn, MD  Chief Complaint  Patient presents with  . Right Shoulder - Pain  . Right Leg - Pain      HPI: Patient presents in follow-up for impingement of her right shoulder.  Patient states that she has been having episodes of right-sided lower back pain as well as right groin pain and right knee pain.  She states she feels like this is due to walking on concrete floors for 25 years.  She states that she has done physical therapy for strengthening of the right shoulder and this is the best treatment she has had to resolve her shoulder symptoms.  Patient complains of start up stiffness involving the right groin area right knee this resolves after activities.  Assessment & Plan: Visit Diagnoses:  1. Impingement syndrome of right shoulder     Plan: [facility]  Her work note was completed stating that her work is essentially unchanged she may rest as needed no lifting greater than 5 pounds no pulling or pushing climbing no overhead activities above her shoulder.  Follow-Up Instructions: Return if symptoms worsen or fail to improve.   Ortho Exam  Patient is alert, oriented, no adenopathy, well-dressed, normal affect, normal respiratory effort. Examination patient has full range of motion of the right shoulder.  She has pain with internal and external rotation.  Right hip has no pain with internal or external rotation the right knee has no pain with flexion or extension there is no effusion of the right knee the patellofemoral joint as well as the medial and lateral joint lines are nontender to palpation there is no effusion.  She has a negative straight leg raise on the right with  no focal motor weakness.  Imaging: No results found. No images are attached to the encounter.  Labs: Lab Results  Component Value Date   ESRSEDRATE 22 01/12/2011    Orders:  No orders of the defined types were placed in this encounter.  No orders of the defined types were placed in this encounter.    Procedures: No procedures performed  Clinical Data: No additional findings.  ROS:  All other systems negative, except as noted in the HPI. Review of Systems  Objective: Vital Signs: There were no vitals taken for this visit.  Specialty Comments:  No specialty comments available.  PMFS History: Patient Active Problem List   Diagnosis Date Noted  . Impingement syndrome of right shoulder 06/21/2016   Past Medical History:  Diagnosis Date  . Thyroid disease     History reviewed. No pertinent family history.  Past Surgical History:  Procedure Laterality Date  . BREAST LUMPECTOMY    . CESAREAN SECTION     Social History   Occupational History  . Not on file  Tobacco Use  . Smoking status: Current Every Day Smoker    Packs/day: 0.50  . Smokeless tobacco: Former Engineer, waterUser  Substance and Sexual Activity  . Alcohol use: Yes    Alcohol/week: 0.6 oz    Types: 1 Glasses of wine per week  Comment: 2 x a year  . Drug use: No  . Sexual activity: Yes

## 2017-03-28 ENCOUNTER — Telehealth (INDEPENDENT_AMBULATORY_CARE_PROVIDER_SITE_OTHER): Payer: Self-pay

## 2017-03-28 NOTE — Telephone Encounter (Signed)
Pt needs dx code and notes for physical therapy order.

## 2017-03-29 NOTE — Telephone Encounter (Signed)
done

## 2017-04-11 DIAGNOSIS — E039 Hypothyroidism, unspecified: Secondary | ICD-10-CM | POA: Diagnosis not present

## 2017-04-11 DIAGNOSIS — E559 Vitamin D deficiency, unspecified: Secondary | ICD-10-CM | POA: Diagnosis not present

## 2017-05-03 ENCOUNTER — Other Ambulatory Visit: Payer: Self-pay | Admitting: Obstetrics and Gynecology

## 2017-05-03 DIAGNOSIS — Z1231 Encounter for screening mammogram for malignant neoplasm of breast: Secondary | ICD-10-CM

## 2017-06-01 ENCOUNTER — Telehealth (INDEPENDENT_AMBULATORY_CARE_PROVIDER_SITE_OTHER): Payer: Self-pay | Admitting: Orthopedic Surgery

## 2017-06-01 NOTE — Telephone Encounter (Signed)
Tonnette called [facility] asking for a new RX for PT to be faxed over for patient. Fax # [phone] attn: Tonnette

## 2017-06-03 NOTE — Telephone Encounter (Signed)
[facility] [phone]

## 2017-06-20 ENCOUNTER — Ambulatory Visit
Admission: RE | Admit: 2017-06-20 | Discharge: 2017-06-20 | Disposition: A | Discharge status: Home / Self Care | Payer: Federal, State, Local not specified - PPO | Source: Ambulatory Visit | Attending: Obstetrics and Gynecology | Admitting: Obstetrics and Gynecology

## 2017-06-20 DIAGNOSIS — Z1231 Encounter for screening mammogram for malignant neoplasm of breast: Secondary | ICD-10-CM

## 2017-06-21 ENCOUNTER — Other Ambulatory Visit: Payer: Self-pay | Admitting: Obstetrics and Gynecology

## 2017-06-21 DIAGNOSIS — R928 Other abnormal and inconclusive findings on diagnostic imaging of breast: Secondary | ICD-10-CM

## 2017-06-27 ENCOUNTER — Ambulatory Visit
Admission: RE | Admit: 2017-06-27 | Discharge: 2017-06-27 | Disposition: A | Discharge status: Home / Self Care | Payer: Federal, State, Local not specified - PPO | Source: Ambulatory Visit | Attending: Obstetrics and Gynecology | Admitting: Obstetrics and Gynecology

## 2017-06-27 ENCOUNTER — Other Ambulatory Visit: Payer: Self-pay | Admitting: Obstetrics and Gynecology

## 2017-06-27 DIAGNOSIS — N651 Disproportion of reconstructed breast: Secondary | ICD-10-CM | POA: Diagnosis not present

## 2017-06-27 DIAGNOSIS — N641 Fat necrosis of breast: Secondary | ICD-10-CM

## 2017-06-27 DIAGNOSIS — R7309 Other abnormal glucose: Secondary | ICD-10-CM | POA: Diagnosis not present

## 2017-06-27 DIAGNOSIS — J309 Allergic rhinitis, unspecified: Secondary | ICD-10-CM | POA: Diagnosis not present

## 2017-06-27 DIAGNOSIS — Z6831 Body mass index (BMI) 31.0-31.9, adult: Secondary | ICD-10-CM | POA: Diagnosis not present

## 2017-06-27 DIAGNOSIS — R928 Other abnormal and inconclusive findings on diagnostic imaging of breast: Secondary | ICD-10-CM | POA: Diagnosis not present

## 2017-06-27 DIAGNOSIS — E039 Hypothyroidism, unspecified: Secondary | ICD-10-CM | POA: Diagnosis not present

## 2017-09-26 ENCOUNTER — Other Ambulatory Visit: Payer: Federal, State, Local not specified - PPO

## 2017-09-27 ENCOUNTER — Encounter (INDEPENDENT_AMBULATORY_CARE_PROVIDER_SITE_OTHER): Payer: Self-pay | Admitting: Orthopedic Surgery

## 2017-09-27 ENCOUNTER — Ambulatory Visit (INDEPENDENT_AMBULATORY_CARE_PROVIDER_SITE_OTHER): Admitting: Orthopedic Surgery

## 2017-09-27 DIAGNOSIS — M7541 Impingement syndrome of right shoulder: Secondary | ICD-10-CM

## 2017-09-27 NOTE — Progress Notes (Signed)
   Office Visit Note   Patient: Anita Duncan           Date of Birth: [birth year]           MRN: [id number] Visit Date: 09/27/2017              Requested by: Dorothyann Peng, MD [address] [place], [place] [address] PCP: Dorothyann Peng, MD  Chief Complaint  Patient presents with  . Right Elbow - Follow-up, Pain      HPI: Patient is a 62 year old woman who has been seen for impingement syndrome of her right shoulder she has been doing physical therapy at [facility].  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  Patient is currently on Relafen she states she has sharp pain at night pain in the subscapularis.  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  Assessment & Plan: Visit Diagnoses:  1. Impingement syndrome of right shoulder     Plan: Feel patient has reached her maximal medical improvement have recommended she continue with deep tissue massage involving the right shoulder monthly.  Her paperwork was completed she is to abstain from overhead work repetitive work she is not to sit on the rest bar she states that this aggravates her subscapularis.  No pulling or pushing.  Patient may rest as needed.  Follow-Up Instructions: Return if symptoms worsen or fail to improve.   Ortho Exam  Patient is alert, oriented, no adenopathy, well-dressed, normal affect, normal respiratory effort. Examination patient has good range of motion of the right shoulder she does have symptoms with Neer and Hawkins impingement test as well as drop arm test.  Imaging: No results found. No images are attached to the encounter.  Labs: Lab Results  Component Value Date   ESRSEDRATE 22 01/12/2011     Lab Results  Component Value Date   ALBUMIN 4.1 12/31/2011    There is no height or weight on file to calculate BMI.  Orders:  No orders of the defined types were placed in this  encounter.  No orders of the defined types were placed in this encounter.    Procedures: No procedures performed  Clinical Data: No additional findings.  ROS:  All other systems negative, except as noted in the HPI. Review of Systems  Objective: Vital Signs: There were no vitals taken for this visit.  Specialty Comments:  No specialty comments available.  PMFS History: Patient Active Problem List   Diagnosis Date Noted  . Impingement syndrome of right shoulder 06/21/2016   Past Medical History:  Diagnosis Date  . Thyroid disease     History reviewed. No pertinent family history.  Past Surgical History:  Procedure Laterality Date  . BREAST BIOPSY Right   . BREAST BIOPSY Right   . BREAST LUMPECTOMY    . CESAREAN SECTION     Social History   Occupational History  . Not on file  Tobacco Use  . Smoking status: Current Every Day Smoker    Packs/day: 0.50  . Smokeless tobacco: Former Engineer, water and Sexual Activity  . Alcohol use: Yes    Alcohol/week: 0.6 oz    Types: 1 Glasses of wine per week    Comment: 2 x a year  . Drug use: No  . Sexual activity: Yes

## 2017-10-03 ENCOUNTER — Other Ambulatory Visit: Payer: Federal, State, Local not specified - PPO

## 2017-10-03 ENCOUNTER — Ambulatory Visit (INDEPENDENT_AMBULATORY_CARE_PROVIDER_SITE_OTHER): Payer: Self-pay | Admitting: Orthopedic Surgery

## 2017-10-03 ENCOUNTER — Ambulatory Visit
Admission: RE | Admit: 2017-10-03 | Discharge: 2017-10-03 | Disposition: A | Discharge status: Home / Self Care | Payer: Federal, State, Local not specified - PPO | Source: Ambulatory Visit | Attending: Obstetrics and Gynecology | Admitting: Obstetrics and Gynecology

## 2017-10-03 DIAGNOSIS — N641 Fat necrosis of breast: Secondary | ICD-10-CM

## 2017-10-03 DIAGNOSIS — N6310 Unspecified lump in the right breast, unspecified quadrant: Secondary | ICD-10-CM | POA: Diagnosis not present

## 2017-10-03 DIAGNOSIS — R928 Other abnormal and inconclusive findings on diagnostic imaging of breast: Secondary | ICD-10-CM | POA: Diagnosis not present

## 2017-10-19 DIAGNOSIS — H0013 Chalazion right eye, unspecified eyelid: Secondary | ICD-10-CM | POA: Diagnosis not present

## 2017-11-09 DIAGNOSIS — F4320 Adjustment disorder, unspecified: Secondary | ICD-10-CM | POA: Diagnosis not present

## 2017-11-09 DIAGNOSIS — Z720 Tobacco use: Secondary | ICD-10-CM | POA: Diagnosis not present

## 2017-11-09 DIAGNOSIS — G4700 Insomnia, unspecified: Secondary | ICD-10-CM | POA: Diagnosis not present

## 2017-11-09 DIAGNOSIS — R51 Headache: Secondary | ICD-10-CM | POA: Diagnosis not present

## 2017-11-14 DIAGNOSIS — Z01419 Encounter for gynecological examination (general) (routine) without abnormal findings: Secondary | ICD-10-CM | POA: Diagnosis not present

## 2017-11-14 DIAGNOSIS — Z1151 Encounter for screening for human papillomavirus (HPV): Secondary | ICD-10-CM | POA: Diagnosis not present

## 2017-11-14 DIAGNOSIS — Z6832 Body mass index (BMI) 32.0-32.9, adult: Secondary | ICD-10-CM | POA: Diagnosis not present

## 2017-12-12 ENCOUNTER — Telehealth (INDEPENDENT_AMBULATORY_CARE_PROVIDER_SITE_OTHER): Payer: Self-pay | Admitting: Orthopedic Surgery

## 2017-12-12 NOTE — Telephone Encounter (Signed)
Patient came into the clinic and wanted to know if the Deep Tissue Massages have been approved.  WN#[id number]#[phone].  Thank you.

## 2017-12-12 NOTE — Telephone Encounter (Signed)
Okay request deep tissue massage from [employer].

## 2017-12-12 NOTE — Telephone Encounter (Signed)
Pt last in the office 09/2017 right shoulder impingement work comp. Did you want to order this for her?

## 2017-12-13 ENCOUNTER — Other Ambulatory Visit (INDEPENDENT_AMBULATORY_CARE_PROVIDER_SITE_OTHER): Payer: Self-pay | Admitting: Orthopedic Surgery

## 2017-12-13 NOTE — Telephone Encounter (Signed)
Pt called left VM returning phone call spoke with Autumn    Pt concern  Left side of neck and shoulder pain

## 2017-12-23 NOTE — Telephone Encounter (Signed)
Called and lm on vm to advise pt that if she is having pain or new problems or flare up of old issues she will need to come into the office for an eval with Dr. Lajoyce Cornersuda whenever she would like to come in we are happy to see her.

## 2017-12-26 ENCOUNTER — Other Ambulatory Visit (INDEPENDENT_AMBULATORY_CARE_PROVIDER_SITE_OTHER): Payer: Self-pay | Admitting: Orthopedic Surgery

## 2017-12-28 ENCOUNTER — Other Ambulatory Visit (INDEPENDENT_AMBULATORY_CARE_PROVIDER_SITE_OTHER): Payer: Self-pay | Admitting: Orthopedic Surgery

## 2018-01-02 ENCOUNTER — Ambulatory Visit (INDEPENDENT_AMBULATORY_CARE_PROVIDER_SITE_OTHER): Admitting: Orthopedic Surgery

## 2018-01-02 ENCOUNTER — Encounter (INDEPENDENT_AMBULATORY_CARE_PROVIDER_SITE_OTHER): Payer: Self-pay | Admitting: Orthopedic Surgery

## 2018-01-02 DIAGNOSIS — M7541 Impingement syndrome of right shoulder: Secondary | ICD-10-CM

## 2018-01-02 NOTE — Progress Notes (Signed)
Office Visit Note   Patient: Anita PottersBrenda A Allende           Date of Birth: 02/13/1956           MRN: [id number] Visit Date: 01/02/2018              Requested by: Dorothyann PengSanders, Robyn, MD [address] [place], [place] [id number] PCP: Dorothyann PengSanders, Robyn, MD  Chief Complaint  Patient presents with  . Right Shoulder - Pain  . Left Shoulder - Pain  . Neck - Pain      HPI: Patient is a 62 year old woman who presents in follow-up for pain in the right shoulder.  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  Patient states is been having increasing pain in the right shoulder she states that she has had no new injuries he states she does use the Relafen patient states that her shoulder has swollen up to times in the past 2 weeks and that she is taken pictures of it she states that her coworkers have noticed that she has had swelling in her shoulder.  Patient currently works seated light duty work nonrepetitive work.  Assessment & Plan: Visit Diagnoses:  1. Impingement syndrome of right shoulder     Plan: Patient will continue her seated light duty work no repetitive work rest as needed no use of machine wounds.  Follow-Up Instructions: Return if symptoms worsen or fail to improve.   Ortho Exam  Patient is alert, oriented, no adenopathy, well-dressed, normal affect, normal respiratory effort. Examination patient has a normal gait.  She has full range of motion of the right shoulder there is no crepitation with range of motion there is no palpable swelling there is no redness no cellulitis the biceps tendon is not tender to palpation the [facility] joint is nontender to palpation Neer and Hawkins impingement test are not tender.  Imaging: No results found. No images are attached to the encounter.  Labs: Lab Results  Component Value Date   ESRSEDRATE 22 01/12/2011     Lab Results  Component Value Date   ALBUMIN 4.1 12/31/2011    There is no height or weight on file to calculate  BMI.  Orders:  No orders of the defined types were placed in this encounter.  No orders of the defined types were placed in this encounter.    Procedures: No procedures performed  Clinical Data: No additional findings.  ROS:  All other systems negative, except as noted in the HPI. Review of Systems  Objective: Vital Signs: There were no vitals taken for this visit.  Specialty Comments:  No specialty comments available.  PMFS History: Patient Active Problem List   Diagnosis Date Noted  . Impingement syndrome of right shoulder 06/21/2016   Past Medical History:  Diagnosis Date  . Thyroid disease     Family History  Problem Relation Age of Onset  . Breast cancer Cousin 31    Past Surgical History:  Procedure Laterality Date  . BREAST BIOPSY Right   . BREAST BIOPSY Right   . BREAST EXCISIONAL BIOPSY Right   . BREAST LUMPECTOMY    . CESAREAN SECTION     Social History   Occupational History  . Not on file  Tobacco Use  . Smoking status: Current Every Day Smoker    Packs/day: 0.50  . Smokeless tobacco: Former Engineer, waterUser  Substance and Sexual Activity  . Alcohol use: Yes    Alcohol/week: 1.0 standard drinks    Types:  1 Glasses of wine per week    Comment: 2 x a year  . Drug use: No  . Sexual activity: Yes

## 2018-01-27 ENCOUNTER — Other Ambulatory Visit (INDEPENDENT_AMBULATORY_CARE_PROVIDER_SITE_OTHER): Payer: Self-pay | Admitting: Family

## 2018-01-27 NOTE — Telephone Encounter (Signed)
Ok for refill? 

## 2018-02-13 ENCOUNTER — Other Ambulatory Visit: Payer: Self-pay | Admitting: Internal Medicine

## 2018-02-13 DIAGNOSIS — Z0000 Encounter for general adult medical examination without abnormal findings: Secondary | ICD-10-CM | POA: Diagnosis not present

## 2018-02-13 DIAGNOSIS — R030 Elevated blood-pressure reading, without diagnosis of hypertension: Secondary | ICD-10-CM | POA: Diagnosis not present

## 2018-02-14 LAB — COMPREHENSIVE METABOLIC PANEL
ALT: 9 IU/L (ref 0–32)
AST: 14 IU/L (ref 0–40)
Albumin/Globulin Ratio: 1.9 (ref 1.2–2.2)
Albumin: 4.4 g/dL (ref 3.6–4.8)
Alkaline Phosphatase: 80 IU/L (ref 39–117)
BUN/Creatinine Ratio: 15 (ref 12–28)
BUN: 13 mg/dL (ref 8–27)
Bilirubin Total: <0.2 mg/dL (ref 0.0–1.2)
CO2: 24 mmol/L (ref 20–29)
Calcium: 8.8 mg/dL (ref 8.7–10.3)
Chloride: 105 mmol/L (ref 96–106)
Creatinine, Ser: 0.84 mg/dL (ref 0.57–1.00)
GFR calc Af Amer: 87 mL/min/{1.73_m2} (ref >59)
GFR calc non Af Amer: 75 mL/min/{1.73_m2} (ref >59)
Globulin, Total: 2.3 g/dL (ref 1.5–4.5)
Glucose: 90 mg/dL (ref 65–99)
Potassium: 4.4 mmol/L (ref 3.5–5.2)
Sodium: 144 mmol/L (ref 134–144)
Total Protein: 6.7 g/dL (ref 6.0–8.5)

## 2018-02-14 LAB — CBC
Hematocrit: 37.8 % (ref 34.0–46.6)
Hemoglobin: 12.3 g/dL (ref 11.1–15.9)
MCH: 28.3 pg (ref 26.6–33.0)
MCHC: 32.5 g/dL (ref 31.5–35.7)
MCV: 87 fL (ref 79–97)
Platelets: 272 10*3/uL (ref 150–450)
RBC: 4.34 x10E6/uL (ref 3.77–5.28)
RDW: 13.1 % (ref 12.3–15.4)
WBC: 6.5 10*3/uL (ref 3.4–10.8)

## 2018-02-14 LAB — LIPID PANEL WITH LDL/HDL RATIO
Cholesterol, Total: 219 mg/dL — ABNORMAL HIGH (ref 100–199)
HDL: 67 mg/dL (ref >39)
LDL Calculated: 141 mg/dL — ABNORMAL HIGH (ref 0–99)
LDl/HDL Ratio: 2.1 ratio (ref 0.0–3.2)
Triglycerides: 57 mg/dL (ref 0–149)
VLDL Cholesterol Cal: 11 mg/dL (ref 5–40)

## 2018-02-14 LAB — T3, FREE: T3, Free: 2.2 pg/mL (ref 2.0–4.4)

## 2018-02-14 LAB — HGB A1C W/O EAG: Hgb A1c MFr Bld: 5.9 % — ABNORMAL HIGH (ref 4.8–5.6)

## 2018-02-14 LAB — TSH: TSH: 1.21 u[IU]/mL (ref 0.450–4.500)

## 2018-02-14 LAB — T4, FREE: Free T4: 1.29 ng/dL (ref 0.82–1.77)

## 2018-02-19 NOTE — Progress Notes (Signed)
Pt given results in [facility].

## 2018-02-21 ENCOUNTER — Telehealth (INDEPENDENT_AMBULATORY_CARE_PROVIDER_SITE_OTHER): Payer: Self-pay | Admitting: Orthopedic Surgery

## 2018-02-21 NOTE — Telephone Encounter (Signed)
Patient called stated she has a W/C comp case and any medication is to go through [facility] not [facility].  She was told to call and notify her doctors.

## 2018-02-27 ENCOUNTER — Telehealth (INDEPENDENT_AMBULATORY_CARE_PROVIDER_SITE_OTHER): Payer: Self-pay | Admitting: Orthopedic Surgery

## 2018-02-27 NOTE — Telephone Encounter (Signed)
Mary at [facility] sent over a progress note for Dr. Lajoyce Corners to sign on 10/9. She said patient has been waiting for a couple of weeks since she is a workers comp patient and she has ran out of authorized visits. She is requesting this be faxed back as soon as possible and meanwhile she will also fax another progress note over in case we did not receive first one. Thanks!

## 2018-02-28 NOTE — Telephone Encounter (Signed)
This was signed and returned to medical records today.

## 2018-02-28 NOTE — Telephone Encounter (Signed)
See previous note

## 2018-02-28 NOTE — Telephone Encounter (Signed)
I do not see anything for SOS and just went through his to sign stack there is nothing pending signature. Have you seen anything?

## 2018-02-28 NOTE — Telephone Encounter (Signed)
The new one received. I will bring to you.

## 2018-04-11 ENCOUNTER — Ambulatory Visit (INDEPENDENT_AMBULATORY_CARE_PROVIDER_SITE_OTHER): Admitting: Orthopedic Surgery

## 2018-04-11 ENCOUNTER — Encounter (INDEPENDENT_AMBULATORY_CARE_PROVIDER_SITE_OTHER): Payer: Self-pay | Admitting: Orthopedic Surgery

## 2018-04-11 VITALS — Wt 170.0 lb

## 2018-04-11 DIAGNOSIS — M7541 Impingement syndrome of right shoulder: Secondary | ICD-10-CM

## 2018-04-11 DIAGNOSIS — M79604 Pain in right leg: Secondary | ICD-10-CM

## 2018-04-11 DIAGNOSIS — M79601 Pain in right arm: Secondary | ICD-10-CM | POA: Diagnosis not present

## 2018-04-11 MED ORDER — CYCLOBENZAPRINE HCL 10 MG PO TABS: 10.0000 mg | ORAL_TABLET | Freq: Three times a day (TID) | ORAL | 0 refills | Status: DC | PRN

## 2018-04-17 ENCOUNTER — Encounter (INDEPENDENT_AMBULATORY_CARE_PROVIDER_SITE_OTHER): Payer: Self-pay | Admitting: Orthopedic Surgery

## 2018-04-17 NOTE — Progress Notes (Signed)
Office Visit Note   Patient: Anita Duncan           Date of Birth: November 21, 1955           MRN: [id number] Visit Date: 04/11/2018              Requested by: Dorothyann Peng, MD [address] [place], [place] [address] PCP: Dorothyann Peng, MD  Chief Complaint  Patient presents with  . Right Shoulder - Pain      HPI: Patient is a 62 year old woman who is seen in follow-up for right shoulder pain.  Patient states that her right shoulder pain now radiates to her right hip.  She states she has some right arm numbness with tingling in the fingers states she has some sharp pain in her lower back and her lower back pain radiates to her toes.  Patient states that physical therapy never called her and she wants therapy for her entire right side.  Assessment & Plan: Visit Diagnoses:  1. Arm pain, right   2. Pain in right leg   3. Impingement syndrome of right shoulder     Plan: Patient is given a prescription for physical therapy.  Prescription for Flexeril was called and she states that this has helped her in the past.  Patient requested a handicap parking and discussed that there is not an indication for handicap parking at this time.  Follow-Up Instructions: Return if symptoms worsen or fail to improve.   Ortho Exam  Patient is alert, oriented, no adenopathy, well-dressed, normal affect, normal respiratory effort. Examination patient has full range of motion of both shoulders.  No pain with Neer or Hawkins impingement test.  Right upper extremity is neurovascular intact with good strength no focal weakness.  Patient has a negative straight leg raise no sciatic tension signs no lower extremity weakness.  Imaging: No results found. No images are attached to the encounter.  Labs: Lab Results  Component Value Date   HGBA1C 5.9 (H) 02/13/2018   ESRSEDRATE 22 01/12/2011     Lab Results  Component Value Date   ALBUMIN 4.4 02/13/2018   ALBUMIN 4.1 12/31/2011    There  is no height or weight on file to calculate BMI.  Orders:  No orders of the defined types were placed in this encounter.  Meds ordered this encounter  Medications  . cyclobenzaprine (FLEXERIL) 10 MG tablet    Sig: Take 1 tablet (10 mg total) by mouth 3 (three) times daily as needed for muscle spasms.    Dispense:  30 tablet    Refill:  0     Procedures: No procedures performed  Clinical Data: No additional findings.  ROS:  All other systems negative, except as noted in the HPI. Review of Systems  Objective: Vital Signs: Wt 170 lb (77.1 kg)   Specialty Comments:  No specialty comments available.  PMFS History: Patient Active Problem List   Diagnosis Date Noted  . Impingement syndrome of right shoulder 06/21/2016   Past Medical History:  Diagnosis Date  . Thyroid disease     Family History  Problem Relation Age of Onset  . Breast cancer Cousin 31    Past Surgical History:  Procedure Laterality Date  . BREAST BIOPSY Right   . BREAST BIOPSY Right   . BREAST EXCISIONAL BIOPSY Right   . BREAST LUMPECTOMY    . CESAREAN SECTION     Social History   Occupational History  . Not on file  Tobacco Use  . Smoking status: Current Every Day Smoker    Packs/day: 0.50  . Smokeless tobacco: Former Engineer, waterUser  Substance and Sexual Activity  . Alcohol use: Yes    Alcohol/week: 1.0 standard drinks    Types: 1 Glasses of wine per week    Comment: 2 x a year  . Drug use: No  . Sexual activity: Yes

## 2018-05-22 ENCOUNTER — Other Ambulatory Visit: Payer: Self-pay

## 2018-06-29 ENCOUNTER — Other Ambulatory Visit: Payer: Self-pay | Admitting: Obstetrics and Gynecology

## 2018-06-29 DIAGNOSIS — Z1231 Encounter for screening mammogram for malignant neoplasm of breast: Secondary | ICD-10-CM

## 2018-07-24 ENCOUNTER — Encounter (INDEPENDENT_AMBULATORY_CARE_PROVIDER_SITE_OTHER): Payer: Self-pay | Admitting: Orthopedic Surgery

## 2018-07-24 ENCOUNTER — Ambulatory Visit (INDEPENDENT_AMBULATORY_CARE_PROVIDER_SITE_OTHER): Admitting: Orthopedic Surgery

## 2018-07-24 VITALS — Wt 170.0 lb

## 2018-07-24 DIAGNOSIS — M542 Cervicalgia: Secondary | ICD-10-CM

## 2018-07-24 DIAGNOSIS — M79601 Pain in right arm: Secondary | ICD-10-CM

## 2018-07-24 DIAGNOSIS — G8929 Other chronic pain: Secondary | ICD-10-CM

## 2018-07-24 DIAGNOSIS — M7541 Impingement syndrome of right shoulder: Secondary | ICD-10-CM

## 2018-07-24 MED ORDER — NABUMETONE 750 MG PO TABS: 750.0000 mg | ORAL_TABLET | Freq: Two times a day (BID) | ORAL | 3 refills | Status: AC | PRN

## 2018-07-24 NOTE — Progress Notes (Signed)
Office Visit Note   Patient: Anita Duncan           Date of Birth: 30-Aug-1955           MRN: [id number] Visit Date: 07/24/2018              Requested by: Dorothyann Peng, MD [address] [place], [place] [address] PCP: Dorothyann Peng, MD  Chief Complaint  Patient presents with  . Right Shoulder - Pain      HPI: Patient is a 63 year old woman who presents complaining of chronic right shoulder pain she states she is now having some numbness and tingling down to her thumb index and long finger in the right hand she states she has pain in the paraspinous muscles of both shoulders.  Patient has had an MRI scan of her cervical spine in 2006 and is worried that most of her problems are coming from her neck.  Patient states that her symptoms are relieved with the Relafen.  Patient states that the tingling in the thumb index and long finger in the right hand are worse in the evening.  Assessment & Plan: Visit Diagnoses:  1. Arm pain, right   2. Impingement syndrome of right shoulder   3. Neck pain, chronic   4. Cervicalgia     Plan: MRI scan was ordered of her cervical spine a refill prescription was [facility] to [facility] to refill her Relafen.  Patient's medical form was completed saying that she is not to use a wrist bar no machines she may rest as needed maximum lifting 10 pounds with no overhead activity.  Follow-Up Instructions: Return if symptoms worsen or fail to improve.   Ortho Exam  Patient is alert, oriented, no adenopathy, well-dressed, normal affect, normal respiratory effort. Examination patient's thoracic outlet is nontender to palpation bilaterally she does have tenderness to palpation in the paraspinous muscles bilaterally of the cervical spine.  No pain with range of motion of her neck.  There is no pain with range of motion of the right shoulder with internal and external rotation.  She has symmetric motor strength in both upper extremities grip  strength is symmetric in both upper extremities she has a negative Phalen's negative Tinel sign of both wrists.  Imaging: No results found. No images are attached to the encounter.  Labs: Lab Results  Component Value Date   HGBA1C 5.9 (H) 02/13/2018   ESRSEDRATE 22 01/12/2011     Lab Results  Component Value Date   ALBUMIN 4.4 02/13/2018   ALBUMIN 4.1 12/31/2011    There is no height or weight on file to calculate BMI.  Orders:  No orders of the defined types were placed in this encounter.  No orders of the defined types were placed in this encounter.    Procedures: No procedures performed  Clinical Data: No additional findings.  ROS:  All other systems negative, except as noted in the HPI. Review of Systems  Objective: Vital Signs: Wt 170 lb (77.1 kg)   Specialty Comments:  No specialty comments available.  PMFS History: Patient Active Problem List   Diagnosis Date Noted  . Impingement syndrome of right shoulder 06/21/2016   Past Medical History:  Diagnosis Date  . Thyroid disease     Family History  Problem Relation Age of Onset  . Breast cancer Cousin 31    Past Surgical History:  Procedure Laterality Date  . BREAST BIOPSY Right   . BREAST BIOPSY Right   .  BREAST EXCISIONAL BIOPSY Right   . BREAST LUMPECTOMY    . CESAREAN SECTION     Social History   Occupational History  . Not on file  Tobacco Use  . Smoking status: Current Every Day Smoker    Packs/day: 0.50  . Smokeless tobacco: Former Engineer, water and Sexual Activity  . Alcohol use: Yes    Alcohol/week: 1.0 standard drinks    Types: 1 Glasses of wine per week    Comment: 2 x a year  . Drug use: No  . Sexual activity: Yes

## 2018-07-27 ENCOUNTER — Other Ambulatory Visit: Payer: Self-pay

## 2018-07-27 ENCOUNTER — Ambulatory Visit: Payer: Federal, State, Local not specified - PPO | Admitting: Internal Medicine

## 2018-07-27 ENCOUNTER — Encounter: Payer: Self-pay | Admitting: Internal Medicine

## 2018-07-27 VITALS — BP 132/70 | HR 86 | Temp 97.6°F | Ht 62.8 in | Wt 178.4 lb

## 2018-07-27 DIAGNOSIS — Z720 Tobacco use: Secondary | ICD-10-CM | POA: Diagnosis not present

## 2018-07-27 DIAGNOSIS — J3089 Other allergic rhinitis: Secondary | ICD-10-CM

## 2018-07-27 MED ORDER — TRIAMCINOLONE ACETONIDE 40 MG/ML IJ SUSP
40.0000 mg | Freq: Once | INTRAMUSCULAR | Status: AC
  Administered 2018-07-27: 40 mg via INTRAMUSCULAR

## 2018-07-27 MED ORDER — TRIAMCINOLONE ACETONIDE 40 MG/ML IJ SUSP: Freq: Once | INTRAMUSCULAR | Status: DC

## 2018-07-27 NOTE — Progress Notes (Signed)
Subjective:     Patient ID: Anita Duncan , female    DOB: 1956-05-04 , 63 y.o.   MRN: [id number]   Chief Complaint  Patient presents with  . Allergies    HPI  Onset of HA last week, and this week she developed sneezing,  rhinitis, itchy eyes and ears and yesterday  had swelling of her eye lids. Has been taking her Xyzol but did not take it yesterday and took norrel AD instead which helped. Has been chilling but denies body aches or fever. Had some dampness on lower back when she got up this am. Has not smoked all week. She also noticed slight yellow color mucous this am.   Past Medical History:  Diagnosis Date  . Thyroid disease      Family History  Problem Relation Age of Onset  . Breast cancer Cousin 31     Current Outpatient Medications:  .  cyclobenzaprine (FLEXERIL) 10 MG tablet, Take 1 tablet (10 mg total) by mouth 3 (three) times daily as needed for muscle spasms., Disp: 30 tablet, Rfl: 0 .  levocetirizine (XYZAL) 5 MG tablet, Take 5 mg by mouth every evening., Disp: , Rfl:  .  levothyroxine (SYNTHROID, LEVOTHROID) 137 MCG tablet, Take 88 mcg by mouth daily. , Disp: , Rfl:  .  acidophilus (RISAQUAD) CAPS, Take 1 capsule by mouth once., Disp: , Rfl:  .  benzonatate (TESSALON) 200 MG capsule, Take 1 capsule (200 mg total) by mouth 3 (three) times daily. For cough (Patient not taking: Reported on 07/27/2018), Disp: 30 capsule, Rfl: 0 .  bisacodyl (DULCOLAX) 10 MG suppository, Place 10 mg rectally once., Disp: , Rfl:  .  ipratropium (ATROVENT) 0.06 % nasal spray, Place 2 sprays into both nostrils 4 (four) times daily. (Patient not taking: Reported on 07/27/2018), Disp: 15 mL, Rfl: 1 .  magnesium citrate SOLN, Take 1 Bottle by mouth once., Disp: , Rfl:  .  nabumetone (RELAFEN) 750 MG tablet, TAKE 1 TABLET (750 MG TOTAL) BY MOUTH 2 (TWO) TIMES DAILY AS NEEDED. FOR PAIN (Patient not taking: Reported on 07/27/2018), Disp: 60 tablet, Rfl: 0 .  nabumetone (RELAFEN) 750 MG tablet, TAKE  1 TABLET (750 MG TOTAL) BY MOUTH 2 (TWO) TIMES DAILY AS NEEDED. FOR PAIN (Patient not taking: Reported on 07/27/2018), Disp: 60 tablet, Rfl: 0 .  nabumetone (RELAFEN) 750 MG tablet, TAKE 1 TABLET (750 MG TOTAL) BY MOUTH 2 (TWO) TIMES DAILY AS NEEDED. FOR PAIN (Patient not taking: Reported on 07/27/2018), Disp: 60 tablet, Rfl: 0 .  nabumetone (RELAFEN) 750 MG tablet, Take 1 tablet (750 mg total) by mouth 2 (two) times daily as needed for up to 30 days for mild pain or moderate pain. with food (Patient not taking: Reported on 07/27/2018), Disp: 60 tablet, Rfl: 3 .  sodium phosphate (FLEET) enema, Place 1 enema rectally once. follow package directions, Disp: , Rfl:    Allergies  Allergen Reactions  . Codeine Nausea Only  . Sulfa Antibiotics Nausea And Vomiting     Review of Systems  Constitutional: Positive for chills. Negative for appetite change, diaphoresis, fatigue and fever.  HENT: Positive for congestion, postnasal drip and rhinorrhea.   Eyes: Positive for itching. Negative for discharge and redness.  Respiratory: Positive for cough. Negative for shortness of breath and wheezing.   Cardiovascular: Negative for chest pain, palpitations and leg swelling.  Gastrointestinal: Negative for diarrhea, nausea and vomiting.  Musculoskeletal: Negative for arthralgias, gait problem, myalgias, neck pain and neck stiffness.  Skin: Negative for rash.  Allergic/Immunologic: Positive for environmental allergies.  Neurological: Positive for headaches. Negative for dizziness and weakness.  Hematological: Negative for adenopathy.     Today's Vitals   07/27/18 1120  BP: 132/70  Pulse: 86  Temp: 97.6 F (36.4 C)  TempSrc: Oral  SpO2: 95%  Weight: 178 lb 6.4 oz (80.9 kg)  Height: 5' 2.8" (1.595 m)   Body mass index is 31.8 kg/m.   Objective:  Physical Exam Vitals signs and nursing note reviewed.  Constitutional:      General: She is not in acute distress.    Appearance: Normal appearance. She is  not toxic-appearing.  HENT:     Right Ear: Tympanic membrane, ear canal and external ear normal.     Left Ear: Tympanic membrane, ear canal and external ear normal.     Nose: Congestion and rhinorrhea present.     Mouth/Throat:     Mouth: Mucous membranes are moist.  Eyes:     General: No scleral icterus.    Conjunctiva/sclera: Conjunctivae normal.  Neck:     Musculoskeletal: Neck supple. No neck rigidity.  Cardiovascular:     Rate and Rhythm: Normal rate and regular rhythm.  Pulmonary:     Effort: Pulmonary effort is normal.     Breath sounds: Normal breath sounds. No wheezing, rhonchi or rales.  Musculoskeletal: Normal range of motion.  Lymphadenopathy:     Cervical: No cervical adenopathy.  Skin:    General: Skin is warm and dry.  Neurological:     Mental Status: She is alert and oriented to person, place, and time.     Gait: Gait normal.  Psychiatric:        Mood and Affect: Mood normal.        Behavior: Behavior normal.        Thought Content: Thought content normal.        Judgment: Judgment normal.     Assessment And Plan:   1. Non-seasonal allergic rhinitis, unspecified trigger-acute. She may   continue Norel AD. Needs to monitor her temp and if she gets one of 100.5 or more she needs to be examined again to make sure she is not getting a secondary infection.  - triamcinolone acetonide (KENALOG-40) injection 40 mg     Anita Housley RODRIGUEZ-SOUTHWORTH, PA-C

## 2018-07-27 NOTE — Patient Instructions (Signed)
Continue taking the Norel AD.    Allergic Rhinitis, Adult Allergic rhinitis is a reaction to allergens in the air. Allergens are tiny specks (particles) in the air that cause your body to have an allergic reaction. This condition cannot be passed from person to person (is not contagious). Allergic rhinitis cannot be cured, but it can be controlled. There are two types of allergic rhinitis:  Seasonal. This type is also called hay fever. It happens only during certain times of the year.  Perennial. This type can happen at any time of the year. What are the causes? This condition may be caused by:  Pollen from grasses, trees, and weeds.  House dust mites.  Pet dander.  Mold. What are the signs or symptoms? Symptoms of this condition include:  Sneezing.  Runny or stuffy nose (nasal congestion).  A lot of mucus in the back of the throat (postnasal drip).  Itchy nose.  Tearing of the eyes.  Trouble sleeping.  Being sleepy during day. How is this treated? There is no cure for this condition. You should avoid things that trigger your symptoms (allergens). Treatment can help to relieve symptoms. This may include:  Medicines that block allergy symptoms, such as antihistamines. These may be given as a shot, nasal spray, or pill.  Shots that are given until your body becomes less sensitive to the allergen (desensitization).  Stronger medicines, if all other treatments have not worked. Follow these instructions at home: Avoiding allergens   Find out what you are allergic to. Common allergens include smoke, dust, and pollen.  Avoid them if you can. These are some of the things that you can do to avoid allergens: ? Replace carpet with wood, tile, or vinyl flooring. Carpet can trap dander and dust. ? Clean any mold found in the home. ? Do not smoke. Do not allow smoking in your home. ? Change your heating and air conditioning filter at least once a month. ? During allergy  season:  Keep windows closed as much as you can. If possible, use air conditioning when there is a lot of pollen in the air.  Use a special filter for allergies with your furnace and air conditioner.  Plan outdoor activities when pollen counts are lowest. This is usually during the early morning or evening hours.  If you do go outdoors when pollen count is high, wear a special mask for people with allergies.  When you come indoors, take a shower and change your clothes before sitting on furniture or bedding. General instructions  Do not use fans in your home.  Do not hang clothes outside to dry.  Wear sunglasses to keep pollen out of your eyes.  Wash your hands right away after you touch household pets.  Take over-the-counter and prescription medicines only as told by your doctor.  Keep all follow-up visits as told by your doctor. This is important. Contact a doctor if:  You have a fever.  You have a cough that does not go away (is persistent).  You start to make whistling sounds when you breathe (wheeze).  Your symptoms do not get better with treatment.  You have thick fluid coming from your nose.  You start to have nosebleeds. Get help right away if:  Your tongue or your lips are swollen.  You have trouble breathing.  You feel dizzy or you feel like you are going to pass out (faint).  You have cold sweats. Summary  Allergic rhinitis is a reaction to allergens  in the air.  This condition may be caused by allergens. These include pollen, dust mites, pet dander, and mold.  Symptoms include a runny, itchy nose, sneezing, or tearing eyes. You may also have trouble sleeping or feel sleepy during the day.  Treatment includes taking medicines and avoiding allergens. You may also get shots or take stronger medicines.  Get help if you have a fever or a cough that does not stop. Get help right away if you are short of breath. This information is not intended to replace  advice given to you by your health care provider. Make sure you discuss any questions you have with your health care provider. Document Released: 09/02/2010 Document Revised: 11/22/2017 Document Reviewed: 11/22/2017 Elsevier Interactive Patient Education  2019 [employer].

## 2018-07-31 ENCOUNTER — Other Ambulatory Visit: Payer: Self-pay

## 2018-07-31 ENCOUNTER — Ambulatory Visit
Admission: RE | Admit: 2018-07-31 | Discharge: 2018-07-31 | Disposition: A | Discharge status: Home / Self Care | Payer: Federal, State, Local not specified - PPO | Source: Ambulatory Visit | Attending: Obstetrics and Gynecology | Admitting: Obstetrics and Gynecology

## 2018-07-31 DIAGNOSIS — E039 Hypothyroidism, unspecified: Secondary | ICD-10-CM

## 2018-07-31 DIAGNOSIS — Z1231 Encounter for screening mammogram for malignant neoplasm of breast: Secondary | ICD-10-CM

## 2018-07-31 DIAGNOSIS — J3089 Other allergic rhinitis: Secondary | ICD-10-CM

## 2018-07-31 MED ORDER — CHLORPHEN-PE-ACETAMINOPHEN 4-10-325 MG PO TABS: ORAL_TABLET | ORAL | 0 refills | Status: DC

## 2018-07-31 MED ORDER — LEVOTHYROXINE SODIUM 137 MCG PO TABS: 88.0000 ug | ORAL_TABLET | Freq: Every day | ORAL | 1 refills | Status: DC

## 2018-08-08 ENCOUNTER — Other Ambulatory Visit: Payer: Self-pay

## 2018-08-14 ENCOUNTER — Encounter: Payer: Self-pay | Admitting: Internal Medicine

## 2018-08-14 ENCOUNTER — Ambulatory Visit: Payer: Federal, State, Local not specified - PPO | Admitting: Internal Medicine

## 2018-08-14 ENCOUNTER — Other Ambulatory Visit: Payer: Self-pay

## 2018-08-14 VITALS — BP 112/68 | HR 74 | Temp 97.7°F | Ht 62.8 in | Wt 178.4 lb

## 2018-08-14 DIAGNOSIS — R7309 Other abnormal glucose: Secondary | ICD-10-CM | POA: Diagnosis not present

## 2018-08-14 DIAGNOSIS — Z6831 Body mass index (BMI) 31.0-31.9, adult: Secondary | ICD-10-CM

## 2018-08-14 DIAGNOSIS — J301 Allergic rhinitis due to pollen: Secondary | ICD-10-CM

## 2018-08-14 DIAGNOSIS — E6609 Other obesity due to excess calories: Secondary | ICD-10-CM

## 2018-08-14 DIAGNOSIS — E039 Hypothyroidism, unspecified: Secondary | ICD-10-CM | POA: Diagnosis not present

## 2018-08-14 NOTE — Progress Notes (Signed)
Subjective:     Patient ID: Anita Duncan , female    DOB: 19-Nov-1955 , 63 y.o.   MRN: [id number]   Chief Complaint  Patient presents with  . Hypothyroidism    HPI  Thyroid Problem  Presents for follow-up visit. Patient reports no cold intolerance, constipation or depressed mood. The symptoms have been stable.     Past Medical History:  Diagnosis Date  . Hypothyroidism      Family History  Problem Relation Age of Onset  . Breast cancer Cousin 31     Current Outpatient Medications:  .  Chlorphen-PE-Acetaminophen (NOREL AD) 4-10-325 MG TABS, TAKE ONE TABLET BY MOUTH TWICE A DAY, Disp: 20 tablet, Rfl: 0 .  cyclobenzaprine (FLEXERIL) 10 MG tablet, Take 1 tablet (10 mg total) by mouth 3 (three) times daily as needed for muscle spasms., Disp: 30 tablet, Rfl: 0 .  levocetirizine (XYZAL) 5 MG tablet, Take 5 mg by mouth every evening., Disp: , Rfl:  .  levothyroxine (SYNTHROID) 100 MCG tablet, Take 100 mcg by mouth daily before breakfast. 1 tablet by mouth Saturday - Sunday, Disp: , Rfl:  .  levothyroxine (SYNTHROID) 88 MCG tablet, Take 88 mcg by mouth daily before breakfast. 1 tablet daily Monday - Friday, Disp: , Rfl:  .  nabumetone (RELAFEN) 750 MG tablet, Take 1 tablet (750 mg total) by mouth 2 (two) times daily as needed for up to 30 days for mild pain or moderate pain. with food, Disp: 60 tablet, Rfl: 3   Allergies  Allergen Reactions  . Codeine Nausea Only  . Sulfa Antibiotics Nausea And Vomiting     Review of Systems  Constitutional: Negative.   Respiratory: Negative.   Cardiovascular: Negative.   Gastrointestinal: Negative.  Negative for constipation.  Endocrine: Negative for cold intolerance.  Neurological: Negative.   Psychiatric/Behavioral: Negative.      Today's Vitals   08/14/18 0928  BP: 112/68  Pulse: 74  Temp: 97.7 F (36.5 C)  TempSrc: Oral  Weight: 178 lb 6.4 oz (80.9 kg)  Height: 5' 2.8" (1.595 m)   Body mass index is 31.8 kg/m.    Objective:  Physical Exam Vitals signs and nursing note reviewed.  Constitutional:      Appearance: Normal appearance.  HENT:     Head: Normocephalic and atraumatic.  Cardiovascular:     Rate and Rhythm: Normal rate and regular rhythm.     Heart sounds: Normal heart sounds.  Pulmonary:     Effort: Pulmonary effort is normal.     Breath sounds: Normal breath sounds.  Skin:    General: Skin is warm.  Neurological:     General: No focal deficit present.     Mental Status: She is alert.  Psychiatric:        Mood and Affect: Mood normal.        Behavior: Behavior normal.         Assessment And Plan:     1. Primary hypothyroidism  I will check thyroid panel and adjust meds as needed.  - TSH - T4, Free  2. Seasonal allergic rhinitis due to pollen  She is advised to increase her flonase to one spray each nostril twice daily.   3. Other abnormal glucose HER A1C HAS BEEN ELEVATED IN THE PAST. I WILL CHECK AN A1C, BMET TODAY. SHE WAS ENCOURAGED TO AVOID SUGARY BEVERAGES AND PROCESSED FOODS INCLUDNG BREADS, RICE AND PASTA. - Hemoglobin A1c - BMP8+EGFR  4. Class 1 obesity due to excess  calories without serious comorbidity with body mass index (BMI) of 31.0 to 31.9 in adult  Importance of achieving optimal weight to decrease risk of cardiovascular disease and cancers was discussed with the patient in full detail. She is encouraged to start slowly - start with 10 minutes twice daily at least three to four days per week and to gradually build to 30 minutes five days weekly. She was given tips to incorporate more activity into her daily routine - take stairs when possible, park farther away from her job, grocery stores, etc. Greater than 50% of time was spent in counseling and coordination of care.          Maximino Greenland, MD

## 2018-08-15 LAB — BMP8+EGFR
BUN/Creatinine Ratio: 19 (ref 12–28)
BUN: 16 mg/dL (ref 8–27)
CO2: 24 mmol/L (ref 20–29)
Calcium: 9.1 mg/dL (ref 8.7–10.3)
Chloride: 106 mmol/L (ref 96–106)
Creatinine, Ser: 0.83 mg/dL (ref 0.57–1.00)
GFR calc Af Amer: 87 mL/min/{1.73_m2} (ref >59)
GFR calc non Af Amer: 76 mL/min/{1.73_m2} (ref >59)
Glucose: 96 mg/dL (ref 65–99)
Potassium: 4.1 mmol/L (ref 3.5–5.2)
Sodium: 143 mmol/L (ref 134–144)

## 2018-08-15 LAB — T4, FREE: Free T4: 1.03 ng/dL (ref 0.82–1.77)

## 2018-08-15 LAB — TSH: TSH: 6.38 u[IU]/mL — ABNORMAL HIGH (ref 0.450–4.500)

## 2018-08-15 LAB — HEMOGLOBIN A1C
Est. average glucose Bld gHb Est-mCnc: 123 mg/dL
Hgb A1c MFr Bld: 5.9 % — ABNORMAL HIGH (ref 4.8–5.6)

## 2018-08-21 ENCOUNTER — Other Ambulatory Visit: Payer: Self-pay | Admitting: Internal Medicine

## 2018-08-22 ENCOUNTER — Other Ambulatory Visit: Payer: Self-pay

## 2018-09-15 ENCOUNTER — Other Ambulatory Visit: Payer: Self-pay | Admitting: Internal Medicine

## 2018-09-18 ENCOUNTER — Other Ambulatory Visit: Payer: Self-pay

## 2018-09-18 MED ORDER — LEVOCETIRIZINE DIHYDROCHLORIDE 5 MG PO TABS: 5.0000 mg | ORAL_TABLET | Freq: Every evening | ORAL | 1 refills | Status: DC

## 2018-09-19 ENCOUNTER — Ambulatory Visit: Payer: Federal, State, Local not specified - PPO

## 2018-09-19 ENCOUNTER — Other Ambulatory Visit: Payer: Self-pay

## 2018-09-19 ENCOUNTER — Ambulatory Visit
Admission: RE | Admit: 2018-09-19 | Discharge: 2018-09-19 | Disposition: A | Discharge status: Home / Self Care | Source: Ambulatory Visit | Attending: Orthopedic Surgery | Admitting: Orthopedic Surgery

## 2018-09-19 DIAGNOSIS — M542 Cervicalgia: Secondary | ICD-10-CM | POA: Diagnosis present

## 2018-09-20 ENCOUNTER — Other Ambulatory Visit: Payer: Self-pay

## 2018-09-20 DIAGNOSIS — G8929 Other chronic pain: Secondary | ICD-10-CM

## 2018-09-20 DIAGNOSIS — M542 Cervicalgia: Principal | ICD-10-CM

## 2018-11-21 ENCOUNTER — Other Ambulatory Visit: Payer: Self-pay | Admitting: Internal Medicine

## 2018-11-27 DIAGNOSIS — Z01419 Encounter for gynecological examination (general) (routine) without abnormal findings: Secondary | ICD-10-CM | POA: Diagnosis not present

## 2018-11-27 DIAGNOSIS — Z6831 Body mass index (BMI) 31.0-31.9, adult: Secondary | ICD-10-CM | POA: Diagnosis not present

## 2018-12-29 IMAGING — US ULTRASOUND RIGHT BREAST LIMITED
1 series · 9 of 9 positions shown · non-contrast
Comparison: Previous exam(s).

ACR Breast Density Category a: The breast tissue is almost entirely
fatty.

ADDENDUM:
Update to the exam: ULTRASOUND OF THE RIGHT BREAST was also
performed.
CLINICAL DATA: 61-year-old female presenting for screening recall
of an asymmetry in the right breast.

EXAM:
2D DIGITAL DIAGNOSTIC UNILATERAL RIGHT MAMMOGRAM WITH CAD AND
ADJUNCT TOMO

[Series 1: ultrasound right breast limited · 0.06mm/px · 9 of 9 slices shown]
[im 1/9]
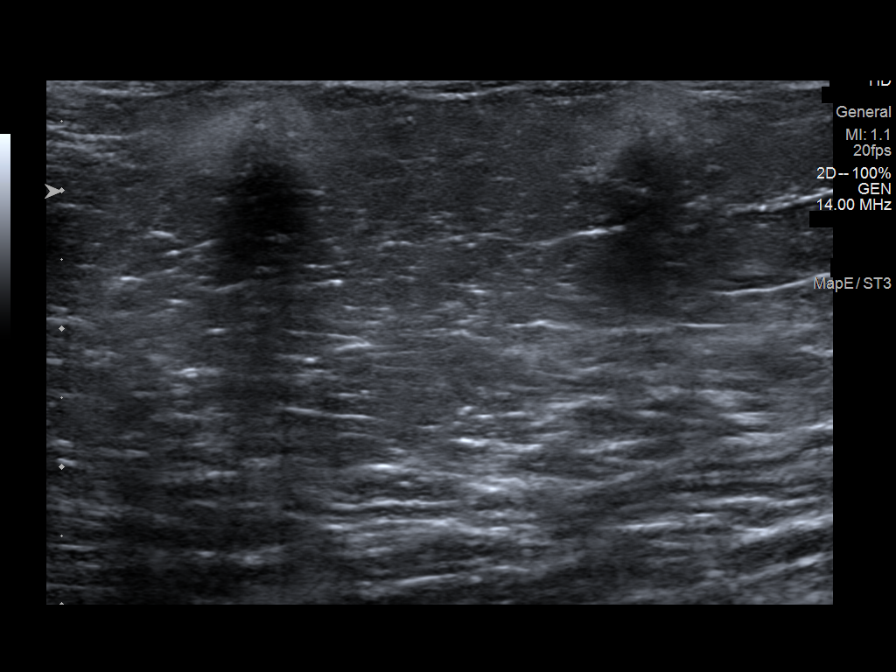
[im 2/9]
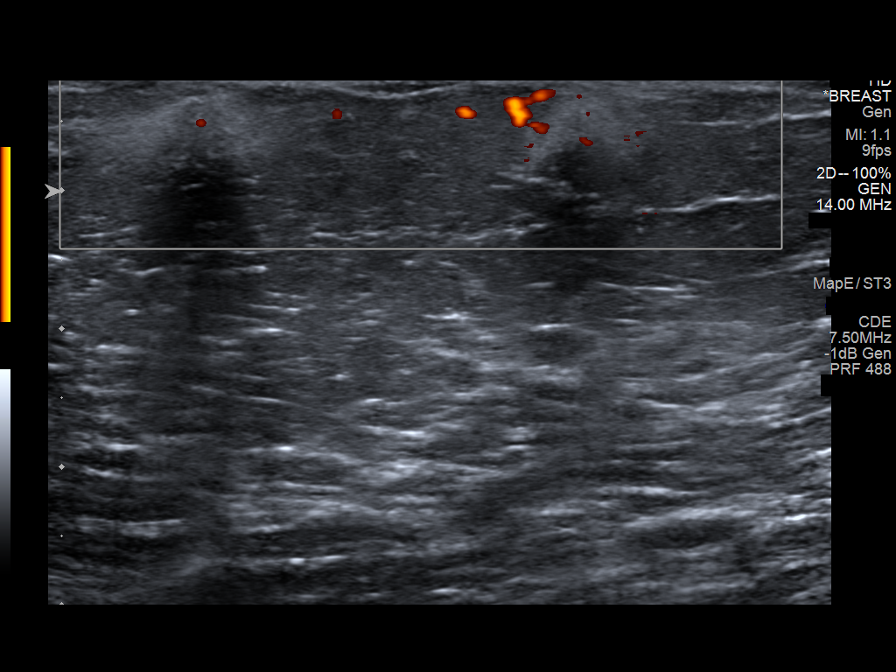
[im 3/9]
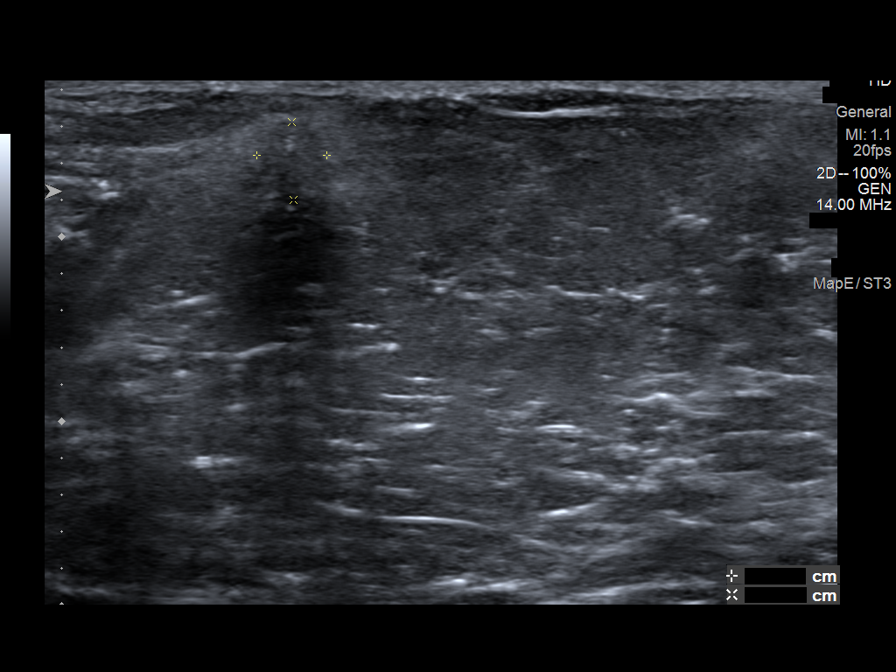
[im 4/9]
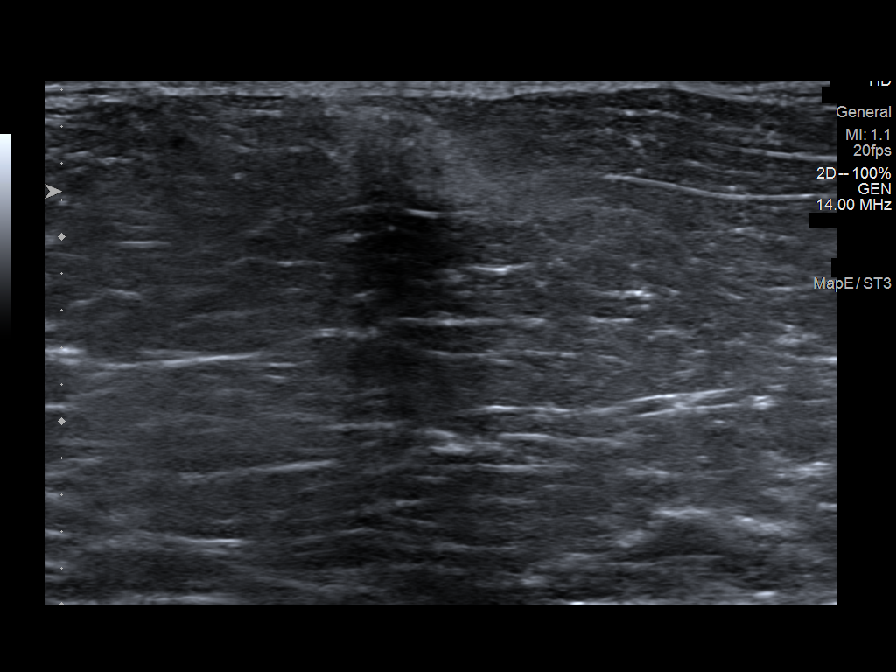
[im 5/9]
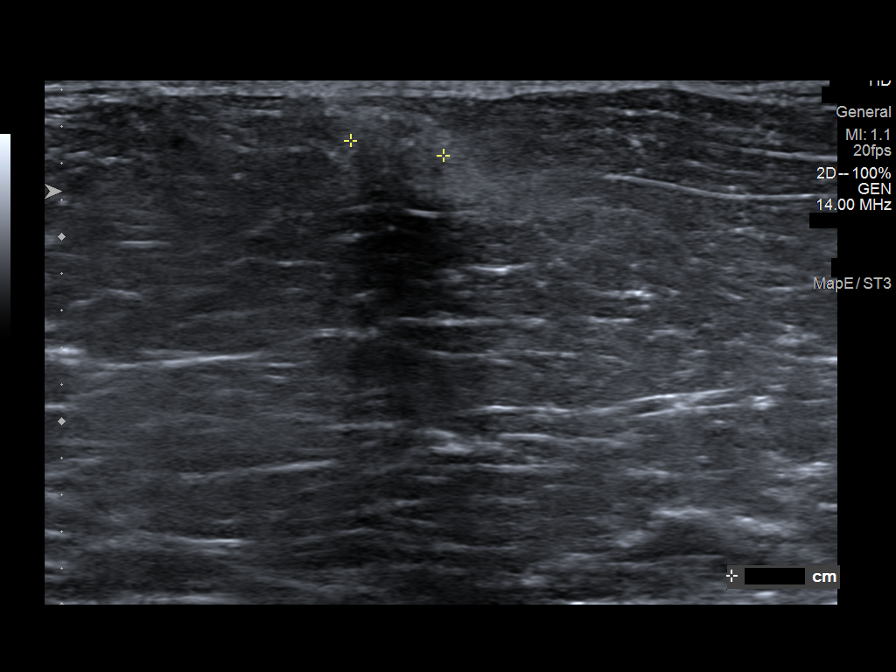
[im 6/9]
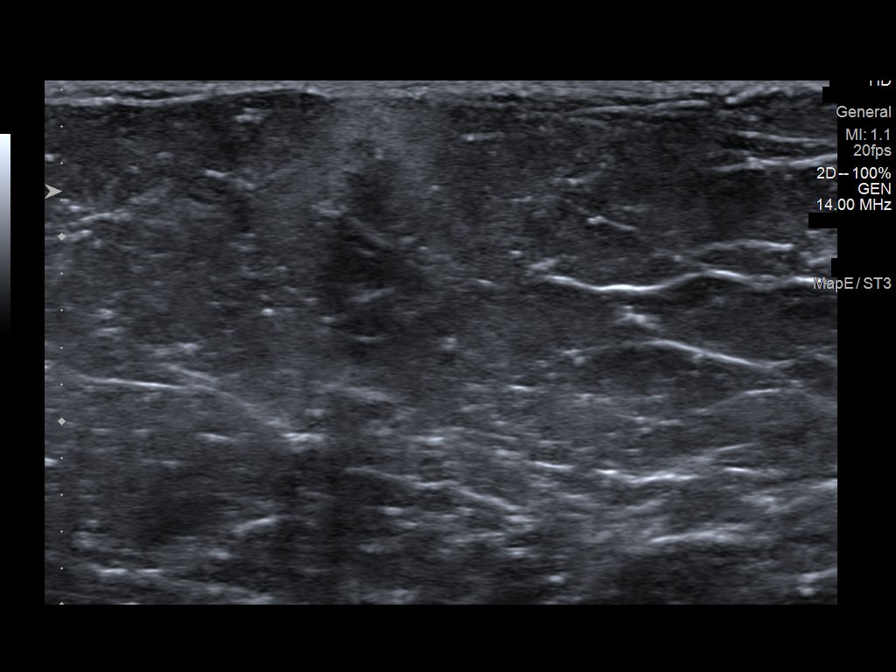
[im 7/9]
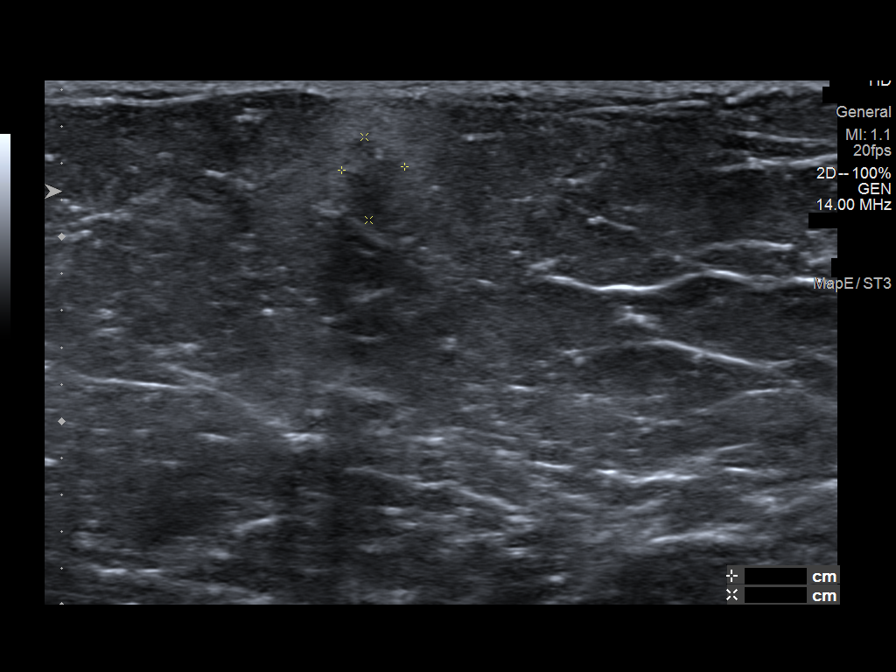
[im 8/9]
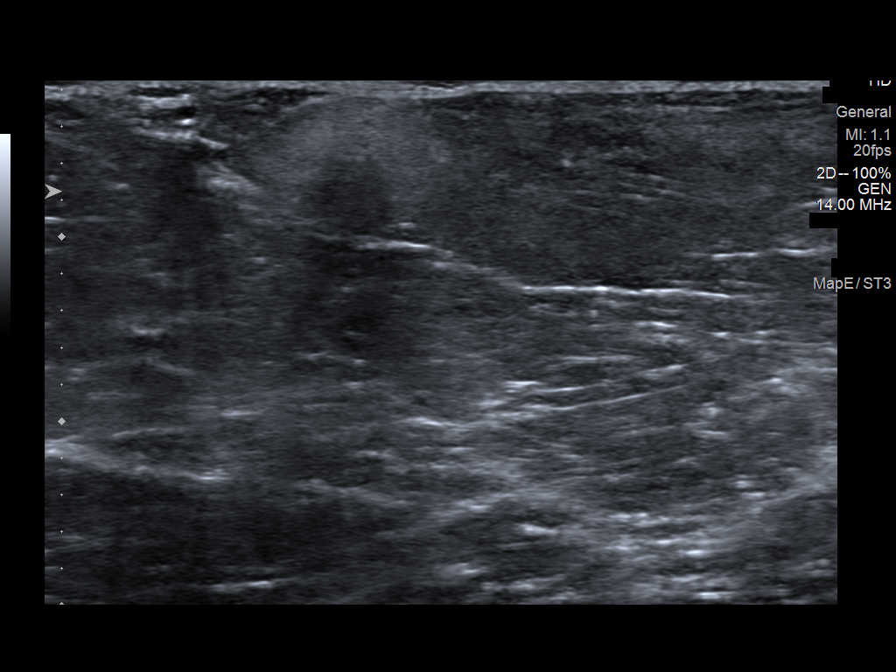
[im 9/9]
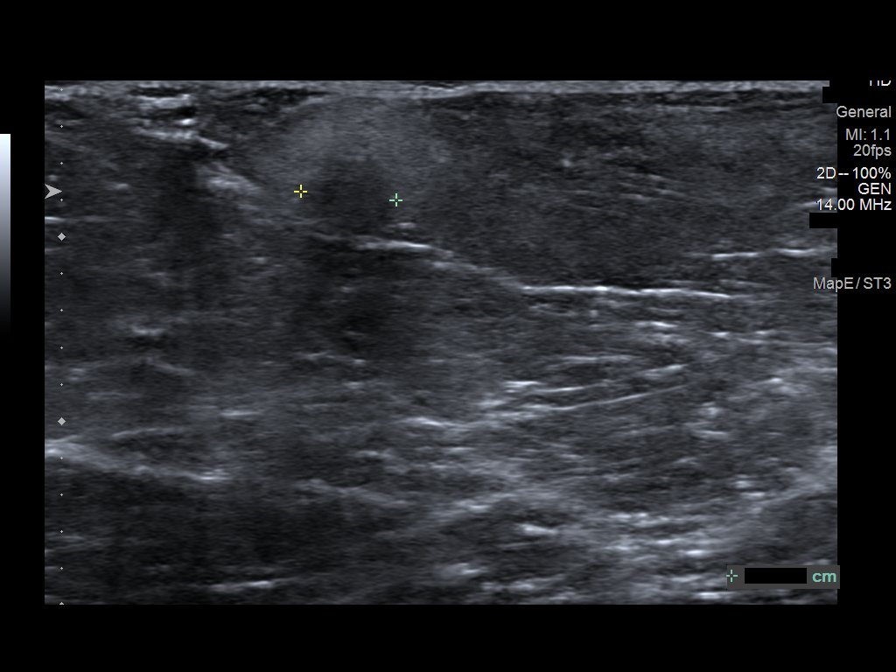

[9 of 9 positions shown; findings below may reference images not displayed]

FINDINGS: In the superior right breast, middle depth there is an irregular
asymmetry with a few internal coarse calcifications measuring
approximately 4 mm.

Mammographic images were processed with CAD.

Ultrasound targeted to the right breast at [DATE], 8 cm from the
nipple demonstrates 2 adjacent superficial indistinct areas of
shadowing, corresponding in size and location with the mass
identified mammographically. One of these measures 5 x 4 x 4 mm
(labeled #1), and the other measures 5 x 3 x 5 mm (labeled #2).
IMPRESSION: 1. There are 2 indistinct masses in the right breast at [DATE], 8 cm
from the nipple possibly representing fat necrosis.

RECOMMENDATION:
Three-month follow-up right breast diagnostic mammogram and
ultrasound.

I have discussed the findings and recommendations with the patient.
Results were also provided in writing at the conclusion of the
visit. If applicable, a reminder letter will be sent to the patient
regarding the next appointment.

BI-RADS CATEGORY  3: Probably benign.

## 2019-01-12 ENCOUNTER — Telehealth: Payer: Self-pay | Admitting: Orthopedic Surgery

## 2019-01-12 NOTE — Telephone Encounter (Signed)
Holding for Autumn 

## 2019-01-12 NOTE — Telephone Encounter (Signed)
Pt called in said she would like a call back from autumn about her referral to [facility] said she was waiting to hear back from autumn?   [phone]

## 2019-01-15 NOTE — Telephone Encounter (Signed)
Looks like this was a workers comp referral for [employer] for ESI injections. I do not know what the end result was for this pt. This referral was made in may. Do I need to do something with this?

## 2019-01-23 NOTE — Telephone Encounter (Signed)
Still not showing on file per dept of labor automated system. Refaxed auth letter with notes to [phone]

## 2019-02-13 ENCOUNTER — Encounter: Payer: Self-pay | Admitting: Family

## 2019-02-13 ENCOUNTER — Ambulatory Visit (INDEPENDENT_AMBULATORY_CARE_PROVIDER_SITE_OTHER): Admitting: Family

## 2019-02-13 ENCOUNTER — Other Ambulatory Visit: Payer: Self-pay

## 2019-02-13 VITALS — Ht 62.0 in | Wt 178.0 lb

## 2019-02-13 DIAGNOSIS — M7541 Impingement syndrome of right shoulder: Secondary | ICD-10-CM

## 2019-02-13 DIAGNOSIS — M79601 Pain in right arm: Secondary | ICD-10-CM

## 2019-02-13 NOTE — Progress Notes (Signed)
Office Visit Note   Patient: Anita Duncan           Date of Birth: [birth year]           MRN: [id number] Visit Date: 02/13/2019              Requested by: Dorothyann Peng, MD [address] [place],  [place] [address] PCP: Dorothyann Peng, MD  Chief Complaint  Patient presents with  . Right Shoulder - Pain      HPI: Patient is a 63 year old woman who presents today for evaluation of right shoulder pain.  She is complaining of pain to her anterior shoulder as well as her posterior shoulder.  Some what she calls subscapular pain as well as some pain that radiates down the outside of her arm.  This is most noticed when she does pushing pulling or above head work.  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  She states that she occasionally needs to be off work several days so that she can get some good rest she does have Relafen that she takes that improves her pain.  In the past has had issues with numbness and tingling however she is not complaining of this today.    MRI scan of her cervical spine in 2006 and is worried that most of her problems are coming from her neck.    Assessment & Plan: Visit Diagnoses:  1. Arm pain, right   2. Impingement syndrome of right shoulder     Plan:  Patient's medical form was completed saying that she is not to use a wrist bar no machines she may rest as needed maximum lifting 10 pounds with no overhead activity.  Follow-Up Instructions: Return if symptoms worsen or fail to improve.   Ortho Exam  Patient is alert, oriented, no adenopathy, well-dressed, normal affect, normal respiratory effort. Examination patient's thoracic outlet is nontender to palpation bilaterally she does have tenderness to palpation in the paraspinous muscles bilaterally of the cervical spine.  No pain with range of motion of her neck.  There is no pain with range of motion of the right shoulder with  internal and external rotation.  She has symmetric motor strength in both upper extremities grip strength is symmetric in both upper extremities she has a negative Phalen's negative Tinel sign of both wrists.  Imaging: No results found. No images are attached to the encounter.  Labs: Lab Results  Component Value Date   HGBA1C 5.9 (H) 08/14/2018   HGBA1C 5.9 (H) 02/13/2018   ESRSEDRATE 22 01/12/2011     Lab Results  Component Value Date   ALBUMIN 4.4 02/13/2018   ALBUMIN 4.1 12/31/2011    Body mass index is 32.56 kg/m.  Orders:  No orders of the defined types were placed in this encounter.  No orders of the defined types were placed in this encounter.    Procedures: No procedures performed  Clinical Data: No additional findings.  ROS:  All other systems negative, except as noted in the HPI. Review of Systems  Constitutional: Negative for chills and fever.  Musculoskeletal: Positive for arthralgias and myalgias. Negative for neck pain.    Objective: Vital Signs: Ht 5\' 2"  (1.575 m)   Wt 178 lb (80.7 kg)   BMI 32.56 kg/m   Specialty Comments:  No specialty comments available.  PMFS History: Patient Active Problem List   Diagnosis Date Noted  .  Impingement syndrome of right shoulder 06/21/2016   Past Medical History:  Diagnosis Date  . Hypothyroidism     Family History  Problem Relation Age of Onset  . Breast cancer Cousin 31    Past Surgical History:  Procedure Laterality Date  . BREAST BIOPSY Right   . BREAST BIOPSY Right   . BREAST EXCISIONAL BIOPSY Right   . BREAST LUMPECTOMY    . CESAREAN SECTION     Social History   Occupational History  . Not on file  Tobacco Use  . Smoking status: Current Every Day Smoker    Packs/day: 0.50    Years: 44.00    Pack years: 22.00    Types: Cigarettes  . Smokeless tobacco: Former Network engineer and Sexual Activity  . Alcohol use: Yes    Alcohol/week: 1.0 standard drinks    Types: 1 Glasses of  wine per week    Comment: 2 x a year  . Drug use: No  . Sexual activity: Not Currently

## 2019-02-19 ENCOUNTER — Other Ambulatory Visit: Payer: Self-pay | Admitting: Internal Medicine

## 2019-02-20 ENCOUNTER — Ambulatory Visit: Payer: Federal, State, Local not specified - PPO | Admitting: Internal Medicine

## 2019-02-20 ENCOUNTER — Other Ambulatory Visit: Payer: Self-pay

## 2019-02-20 ENCOUNTER — Encounter: Payer: Self-pay | Admitting: Internal Medicine

## 2019-02-20 VITALS — BP 128/68 | HR 85 | Temp 98.3°F | Ht 61.6 in | Wt 176.0 lb

## 2019-02-20 DIAGNOSIS — Z23 Encounter for immunization: Secondary | ICD-10-CM

## 2019-02-20 DIAGNOSIS — Z6832 Body mass index (BMI) 32.0-32.9, adult: Secondary | ICD-10-CM

## 2019-02-20 DIAGNOSIS — Z0000 Encounter for general adult medical examination without abnormal findings: Secondary | ICD-10-CM

## 2019-02-20 DIAGNOSIS — Z716 Tobacco abuse counseling: Secondary | ICD-10-CM | POA: Diagnosis not present

## 2019-02-20 DIAGNOSIS — E6609 Other obesity due to excess calories: Secondary | ICD-10-CM | POA: Diagnosis not present

## 2019-02-20 LAB — POCT URINALYSIS DIPSTICK
Glucose, UA: NEGATIVE
Ketones, UA: NEGATIVE
Leukocytes, UA: NEGATIVE
Nitrite, UA: NEGATIVE
Protein, UA: POSITIVE — AB
Spec Grav, UA: 1.025 (ref 1.010–1.025)
Urobilinogen, UA: 1 E.U./dL
pH, UA: 6 (ref 5.0–8.0)

## 2019-02-20 NOTE — Patient Instructions (Signed)
Health Maintenance, Female Adopting a healthy lifestyle and getting preventive care are important in promoting health and wellness. Ask your health care provider about:  The right schedule for you to have regular tests and exams.  Things you can do on your own to prevent diseases and keep yourself healthy. What should I know about diet, weight, and exercise? Eat a healthy diet   Eat a diet that includes plenty of vegetables, fruits, low-fat dairy products, and lean protein.  Do not eat a lot of foods that are high in solid fats, added sugars, or sodium. Maintain a healthy weight Body mass index (BMI) is used to identify weight problems. It estimates body fat based on height and weight. Your health care provider can help determine your BMI and help you achieve or maintain a healthy weight. Get regular exercise Get regular exercise. This is one of the most important things you can do for your health. Most adults should:  Exercise for at least 150 minutes each week. The exercise should increase your heart rate and make you sweat (moderate-intensity exercise).  Do strengthening exercises at least twice a week. This is in addition to the moderate-intensity exercise.  Spend less time sitting. Even light physical activity can be beneficial. Watch cholesterol and blood lipids Have your blood tested for lipids and cholesterol at 63 years of age, then have this test every 5 years. Have your cholesterol levels checked more often if:  Your lipid or cholesterol levels are high.  You are older than 63 years of age.  You are at high risk for heart disease. What should I know about cancer screening? Depending on your health history and family history, you may need to have cancer screening at various ages. This may include screening for:  Breast cancer.  Cervical cancer.  Colorectal cancer.  Skin cancer.  Lung cancer. What should I know about heart disease, diabetes, and high blood  pressure? Blood pressure and heart disease  High blood pressure causes heart disease and increases the risk of stroke. This is more likely to develop in people who have high blood pressure readings, are of African descent, or are overweight.  Have your blood pressure checked: ? Every 3-5 years if you are 18-39 years of age. ? Every year if you are 40 years old or older. Diabetes Have regular diabetes screenings. This checks your fasting blood sugar level. Have the screening done:  Once every three years after age 40 if you are at a normal weight and have a low risk for diabetes.  More often and at a younger age if you are overweight or have a high risk for diabetes. What should I know about preventing infection? Hepatitis B If you have a higher risk for hepatitis B, you should be screened for this virus. Talk with your health care provider to find out if you are at risk for hepatitis B infection. Hepatitis C Testing is recommended for:  Everyone born from 1945 through 1965.  Anyone with known risk factors for hepatitis C. Sexually transmitted infections (STIs)  Get screened for STIs, including gonorrhea and chlamydia, if: ? You are sexually active and are younger than 63 years of age. ? You are older than 63 years of age and your health care provider tells you that you are at risk for this type of infection. ? Your sexual activity has changed since you were last screened, and you are at increased risk for chlamydia or gonorrhea. Ask your health care provider if   you are at risk.  Ask your health care provider about whether you are at high risk for HIV. Your health care provider may recommend a prescription medicine to help prevent HIV infection. If you choose to take medicine to prevent HIV, you should first get tested for HIV. You should then be tested every 3 months for as long as you are taking the medicine. Pregnancy  If you are about to stop having your period (premenopausal) and  you may become pregnant, seek counseling before you get pregnant.  Take 400 to 800 micrograms (mcg) of folic acid every day if you become pregnant.  Ask for birth control (contraception) if you want to prevent pregnancy. Osteoporosis and menopause Osteoporosis is a disease in which the bones lose minerals and strength with aging. This can result in bone fractures. If you are 65 years old or older, or if you are at risk for osteoporosis and fractures, ask your health care provider if you should:  Be screened for bone loss.  Take a calcium or vitamin D supplement to lower your risk of fractures.  Be given hormone replacement therapy (HRT) to treat symptoms of menopause. Follow these instructions at home: Lifestyle  Do not use any products that contain nicotine or tobacco, such as cigarettes, e-cigarettes, and chewing tobacco. If you need help quitting, ask your health care provider.  Do not use street drugs.  Do not share needles.  Ask your health care provider for help if you need support or information about quitting drugs. Alcohol use  Do not drink alcohol if: ? Your health care provider tells you not to drink. ? You are pregnant, may be pregnant, or are planning to become pregnant.  If you drink alcohol: ? Limit how much you use to 0-1 drink a day. ? Limit intake if you are breastfeeding.  Be aware of how much alcohol is in your drink. In the [place] one drink equals one 12 oz bottle of beer (355 mL), one 5 oz glass of wine (148 mL), or one 1 oz glass of hard liquor (44 mL). General instructions  Schedule regular health, dental, and eye exams.  Stay current with your vaccines.  Tell your health care provider if: ? You often feel depressed. ? You have ever been abused or do not feel safe at home. Summary  Adopting a healthy lifestyle and getting preventive care are important in promoting health and wellness.  Follow your health care provider's instructions about healthy  diet, exercising, and getting tested or screened for diseases.  Follow your health care provider's instructions on monitoring your cholesterol and blood pressure. This information is not intended to replace advice given to you by your health care provider. Make sure you discuss any questions you have with your health care provider. Document Released: 11/16/2010 Document Revised: 04/26/2018 Document Reviewed: 04/26/2018 Elsevier Patient Education  2020 Elsevier Inc.  

## 2019-02-20 NOTE — Progress Notes (Signed)
Subjective:     Patient ID: Anita Duncan , female    DOB: 05-Dec-1955 , 63 y.o.   MRN: [id number]   Chief Complaint  Patient presents with  . Annual Exam    HPI  She is here today for a full physical examination. She is followed by Dr. Ronita Hipps for her GYN exams. She reports she is up to date with her GYN exams.     Past Medical History:  Diagnosis Date  . Hypothyroidism      Family History  Problem Relation Age of Onset  . Breast cancer Cousin 31     Current Outpatient Medications:  .  Chlorphen-PE-Acetaminophen (NOREL AD) 4-10-325 MG TABS, TAKE ONE TABLET BY MOUTH TWICE A DAY, Disp: 20 tablet, Rfl: 0 .  cyclobenzaprine (FLEXERIL) 10 MG tablet, Take 1 tablet (10 mg total) by mouth 3 (three) times daily as needed for muscle spasms., Disp: 30 tablet, Rfl: 0 .  fluticasone (FLONASE) 50 MCG/ACT nasal spray, Place into both nostrils daily., Disp: , Rfl:  .  levocetirizine (XYZAL) 5 MG tablet, TAKE 1 TABLET BY MOUTH EVERY DAY IN THE EVENING, Disp: 90 tablet, Rfl: 1 .  SYNTHROID 100 MCG tablet, TAKE 1 TABLET FRIDAY       THROUGH SUNDAY, Disp: 39 tablet, Rfl: 1 .  SYNTHROID 88 MCG tablet, TAKE 1 TABLET MONDAY       THROUGH THURSDAY, Disp: 52 tablet, Rfl: 0   Allergies  Allergen Reactions  . Codeine Nausea Only  . Sulfa Antibiotics Nausea And Vomiting     Review of Systems  Constitutional: Negative.   HENT: Negative.   Eyes: Negative.   Respiratory: Negative.   Cardiovascular: Negative.   Endocrine: Negative.   Genitourinary: Negative.   Musculoskeletal: Negative.   Skin: Negative.   Allergic/Immunologic: Negative.   Neurological: Negative.   Hematological: Negative.   Psychiatric/Behavioral: Negative.      Today's Vitals   02/20/19 1433  BP: 128/68  Pulse: 85  Temp: 98.3 F (36.8 C)  TempSrc: Oral  SpO2: 97%  Weight: 176 lb (79.8 kg)  Height: 5' 1.6" (1.565 m)   Body mass index is 32.61 kg/m.   Objective:  Physical Exam Vitals signs and nursing note  reviewed.  Constitutional:      Appearance: Normal appearance. She is obese.  HENT:     Head: Normocephalic and atraumatic.     Right Ear: Tympanic membrane, ear canal and external ear normal.     Left Ear: Tympanic membrane, ear canal and external ear normal.     Nose: Nose normal.     Mouth/Throat:     Mouth: Mucous membranes are moist.     Pharynx: Oropharynx is clear.  Eyes:     Extraocular Movements: Extraocular movements intact.     Conjunctiva/sclera: Conjunctivae normal.     Pupils: Pupils are equal, round, and reactive to light.  Neck:     Musculoskeletal: Normal range of motion and neck supple.  Cardiovascular:     Rate and Rhythm: Normal rate and regular rhythm.     Pulses: Normal pulses.     Heart sounds: Normal heart sounds.  Pulmonary:     Effort: Pulmonary effort is normal.     Breath sounds: Normal breath sounds.  Chest:     Breasts: Tanner Score is 5.     Comments: Deferred, she kept her bra on.  Abdominal:     General: Abdomen is flat. Bowel sounds are normal.     Palpations:  Abdomen is soft.  Genitourinary:    Comments: deferred Musculoskeletal: Normal range of motion.  Skin:    General: Skin is warm and dry.  Neurological:     General: No focal deficit present.     Mental Status: She is alert and oriented to person, place, and time.  Psychiatric:        Mood and Affect: Mood normal.        Behavior: Behavior normal.         Assessment And Plan:     1. Encounter for annual physical exam  A full exam was performed.  Importance of monthly self breast exams was discussed with the patient.  She kept her bra on for the exam; therefore, breast exam not performed. PATIENT HAS BEEN ADVISED TO GET 30-45 MINUTES REGULAR EXERCISE NO LESS THAN FOUR TO FIVE DAYS PER WEEK - BOTH WEIGHTBEARING EXERCISES AND AEROBIC ARE RECOMMENDED.  SHE WAS ADVISED TO FOLLOW A HEALTHY DIET WITH AT LEAST SIX FRUITS/VEGGIES PER DAY, DECREASE INTAKE OF RED MEAT, AND TO INCREASE FISH  INTAKE TO TWO DAYS PER WEEK.  MEATS/FISH SHOULD NOT BE FRIED, BAKED OR BROILED IS PREFERABLE.  I SUGGEST WEARING SPF 50 SUNSCREEN ON EXPOSED PARTS AND ESPECIALLY WHEN IN THE DIRECT SUNLIGHT FOR AN EXTENDED PERIOD OF TIME.  PLEASE AVOID FAST FOOD RESTAURANTS AND INCREASE YOUR WATER INTAKE.   - POCT Urinalysis Dipstick (81002) - CMP14+EGFR - CBC - Lipid panel - Hemoglobin A1c - TSH - T4, Free  2. Flu vaccine need  She was given flu vaccine to update her immunization history.   3. Tobacco abuse counseling  Smoking cessation instruction/counseling given:  counseled patient on the dangers of tobacco use, advised patient to stop smoking, and reviewed strategies to maximize success  4. Class 1 obesity due to excess calories with serious comorbidity and body mass index (BMI) of 32.0 to 32.9 in adult  Importance of achieving optimal weight to decrease risk of cardiovascular disease and cancers was discussed with the patient in full detail. Importance of regular exercise was discussed with the patient.  She is encouraged to start slowly - start with 10 minutes twice daily at least three to four days per week and to gradually build to 30 minutes five days weekly. She was given tips to incorporate more activity into her daily routine - take stairs when possible, park farther away from her job, grocery stores, etc.     Maximino Greenland, MD    THE PATIENT IS ENCOURAGED TO PRACTICE SOCIAL DISTANCING DUE TO THE COVID-19 PANDEMIC.

## 2019-02-21 LAB — CBC
Hematocrit: 35.3 % (ref 34.0–46.6)
Hemoglobin: 11.8 g/dL (ref 11.1–15.9)
MCH: 28.7 pg (ref 26.6–33.0)
MCHC: 33.4 g/dL (ref 31.5–35.7)
MCV: 86 fL (ref 79–97)
Platelets: 238 10*3/uL (ref 150–450)
RBC: 4.11 x10E6/uL (ref 3.77–5.28)
RDW: 13 % (ref 11.7–15.4)
WBC: 6.7 10*3/uL (ref 3.4–10.8)

## 2019-02-21 LAB — CMP14+EGFR
ALT: 9 IU/L (ref 0–32)
AST: 17 IU/L (ref 0–40)
Albumin/Globulin Ratio: 1.7 (ref 1.2–2.2)
Albumin: 4.3 g/dL (ref 3.8–4.8)
Alkaline Phosphatase: 91 IU/L (ref 39–117)
BUN/Creatinine Ratio: 17 (ref 12–28)
BUN: 14 mg/dL (ref 8–27)
Bilirubin Total: 0.4 mg/dL (ref 0.0–1.2)
CO2: 25 mmol/L (ref 20–29)
Calcium: 9.1 mg/dL (ref 8.7–10.3)
Chloride: 105 mmol/L (ref 96–106)
Creatinine, Ser: 0.82 mg/dL (ref 0.57–1.00)
GFR calc Af Amer: 89 mL/min/{1.73_m2} (ref >59)
GFR calc non Af Amer: 77 mL/min/{1.73_m2} (ref >59)
Globulin, Total: 2.6 g/dL (ref 1.5–4.5)
Glucose: 94 mg/dL (ref 65–99)
Potassium: 4.3 mmol/L (ref 3.5–5.2)
Sodium: 142 mmol/L (ref 134–144)
Total Protein: 6.9 g/dL (ref 6.0–8.5)

## 2019-02-21 LAB — LIPID PANEL
Chol/HDL Ratio: 3.1 ratio (ref 0.0–4.4)
Cholesterol, Total: 205 mg/dL — ABNORMAL HIGH (ref 100–199)
HDL: 66 mg/dL (ref >39)
LDL Chol Calc (NIH): 131 mg/dL — ABNORMAL HIGH (ref 0–99)
Triglycerides: 41 mg/dL (ref 0–149)
VLDL Cholesterol Cal: 8 mg/dL (ref 5–40)

## 2019-02-21 LAB — HEMOGLOBIN A1C
Est. average glucose Bld gHb Est-mCnc: 123 mg/dL
Hgb A1c MFr Bld: 5.9 % — ABNORMAL HIGH (ref 4.8–5.6)

## 2019-02-21 LAB — T4, FREE: Free T4: 1.6 ng/dL (ref 0.82–1.77)

## 2019-02-21 LAB — TSH: TSH: 0.404 u[IU]/mL — ABNORMAL LOW (ref 0.450–4.500)

## 2019-02-24 ENCOUNTER — Other Ambulatory Visit: Payer: Self-pay | Admitting: Internal Medicine

## 2019-02-24 DIAGNOSIS — J3089 Other allergic rhinitis: Secondary | ICD-10-CM

## 2019-02-26 MED ORDER — NOREL AD 4-10-325 MG PO TABS: ORAL_TABLET | ORAL | 0 refills | Status: DC

## 2019-03-30 ENCOUNTER — Other Ambulatory Visit: Payer: Self-pay | Admitting: Internal Medicine

## 2019-06-18 ENCOUNTER — Encounter: Payer: Self-pay | Admitting: Orthopedic Surgery

## 2019-06-20 ENCOUNTER — Other Ambulatory Visit: Payer: Self-pay | Admitting: Obstetrics and Gynecology

## 2019-06-20 DIAGNOSIS — Z1231 Encounter for screening mammogram for malignant neoplasm of breast: Secondary | ICD-10-CM

## 2019-06-25 ENCOUNTER — Ambulatory Visit (INDEPENDENT_AMBULATORY_CARE_PROVIDER_SITE_OTHER): Admitting: Orthopedic Surgery

## 2019-06-25 ENCOUNTER — Other Ambulatory Visit: Payer: Self-pay

## 2019-06-25 ENCOUNTER — Ambulatory Visit: Payer: Federal, State, Local not specified - PPO | Admitting: Orthopedic Surgery

## 2019-06-25 ENCOUNTER — Encounter: Payer: Self-pay | Admitting: Orthopedic Surgery

## 2019-06-25 VITALS — Ht 61.0 in | Wt 176.0 lb

## 2019-06-25 DIAGNOSIS — M7541 Impingement syndrome of right shoulder: Secondary | ICD-10-CM

## 2019-06-25 NOTE — Progress Notes (Signed)
Office Visit Note   Patient: Anita Duncan           Date of Birth: 06-15-1955           MRN: [id number] Visit Date: 06/25/2019              Requested by: Dorothyann Peng, MD [address] [place],  [place] [address] PCP: Dorothyann Peng, MD  Chief Complaint  Patient presents with  . Right Shoulder - Pain      HPI: Patient is a 64 year old woman who presents for 2 separate issues.  #1 she states she has been having increasing pain in the right shoulder she states that she feels like it is more painful due to the cold.  She states the Relafen does work well.  She denies any GI symptoms.  Patient also states she occasionally has some muscle spasms in her right thumb where the thumb will spasm and abduct into her palm.  She states this takes time to resolve.  Assessment & Plan: Visit Diagnoses:  1. Impingement syndrome of right shoulder     Plan: Recommended patient use electrolyte replacement such as coconut water to help with the spasm in the right thumb.  Continue with the Relafen for the right shoulder.  Her CA 17 was completed with no overhead work maximum lifting 8 pounds continuous lifting 5 pounds she should take rests as needed.  Follow-Up Instructions: Return if symptoms worsen or fail to improve.   Ortho Exam  Patient is alert, oriented, no adenopathy, well-dressed, normal affect, normal respiratory effort. On examination patient has abduction of flexion of the right shoulder to 110 she has pain with Neer and Hawkins impingement test.  Patient has a normal physical exam of the right hand scaphoid scapholunate and TFCC are nontender to palpation the first dorsal extensor compartment is nontender to palpation.  Patient has active flexion and extension of the thumb.  There is no triggering.  Imaging: No results found. No images are attached to the encounter.  Labs: Lab Results  Component Value Date   HGBA1C 5.9 (H) 02/20/2019   HGBA1C 5.9 (H) 08/14/2018   HGBA1C 5.9 (H) 02/13/2018   ESRSEDRATE 22 01/12/2011     Lab Results  Component Value Date   ALBUMIN 4.3 02/20/2019   ALBUMIN 4.4 02/13/2018   ALBUMIN 4.1 12/31/2011    No results found for: MG No results found for: VD25OH  No results found for: PREALBUMIN CBC EXTENDED Latest Ref Rng & Units 02/20/2019 02/13/2018 12/31/2011  WBC 3.4 - 10.8 x10E3/uL 6.7 6.5 8.2  RBC 3.77 - 5.28 x10E6/uL 4.11 4.34 4.37  HGB 11.1 - 15.9 g/dL 09.3 23.5 57.3  HCT 22.0 - 46.6 % 35.3 37.8 38.8  PLT 150 - 450 x10E3/uL 238 272 237  NEUTROABS 1.7 - 7.7 K/uL - - 5.5  LYMPHSABS 0.7 - 4.0 K/uL - - 2.2     Body mass index is 33.25 kg/m.  Orders:  No orders of the defined types were placed in this encounter.  No orders of the defined types were placed in this encounter.    Procedures: No procedures performed  Clinical Data: No additional findings.  ROS:  All other systems negative, except as noted in the HPI. Review of Systems  Objective: Vital Signs: Ht 5\' 1"  (1.549 m)   Wt 176 lb (79.8 kg)   BMI 33.25 kg/m   Specialty Comments:  No specialty comments available.  PMFS History: Patient Active Problem List  Diagnosis Date Noted  . Impingement syndrome of right shoulder 06/21/2016   Past Medical History:  Diagnosis Date  . Hypothyroidism     Family History  Problem Relation Age of Onset  . Breast cancer Cousin 31    Past Surgical History:  Procedure Laterality Date  . BREAST BIOPSY Right   . BREAST BIOPSY Right   . BREAST EXCISIONAL BIOPSY Right   . BREAST LUMPECTOMY    . CESAREAN SECTION     Social History   Occupational History  . Not on file  Tobacco Use  . Smoking status: Current Every Day Smoker    Packs/day: 0.50    Years: 44.00    Pack years: 22.00    Types: Cigarettes  . Smokeless tobacco: Former Systems developer  . Tobacco comment: Encouraged to cut back on number of cigs smoked  Substance and Sexual Activity  . Alcohol use: Yes    Alcohol/week: 1.0 standard drinks     Types: 1 Glasses of wine per week    Comment: 2 x a year  . Drug use: No  . Sexual activity: Not Currently

## 2019-08-02 ENCOUNTER — Encounter: Payer: Self-pay | Admitting: Orthopedic Surgery

## 2019-08-03 ENCOUNTER — Telehealth: Payer: Self-pay | Admitting: Orthopedic Surgery

## 2019-08-03 NOTE — Telephone Encounter (Signed)
error 

## 2019-08-06 ENCOUNTER — Ambulatory Visit
Admission: RE | Admit: 2019-08-06 | Discharge: 2019-08-06 | Disposition: A | Discharge status: Home / Self Care | Payer: Federal, State, Local not specified - PPO | Source: Ambulatory Visit | Attending: Obstetrics and Gynecology | Admitting: Obstetrics and Gynecology

## 2019-08-06 ENCOUNTER — Other Ambulatory Visit: Payer: Self-pay

## 2019-08-06 DIAGNOSIS — Z1231 Encounter for screening mammogram for malignant neoplasm of breast: Secondary | ICD-10-CM | POA: Diagnosis not present

## 2019-08-07 ENCOUNTER — Encounter: Payer: Self-pay | Admitting: Physician Assistant

## 2019-08-07 ENCOUNTER — Ambulatory Visit (INDEPENDENT_AMBULATORY_CARE_PROVIDER_SITE_OTHER): Admitting: Orthopedic Surgery

## 2019-08-07 VITALS — Ht 61.0 in | Wt 176.0 lb

## 2019-08-07 DIAGNOSIS — M79601 Pain in right arm: Secondary | ICD-10-CM | POA: Diagnosis not present

## 2019-08-08 ENCOUNTER — Encounter: Payer: Self-pay | Admitting: Orthopedic Surgery

## 2019-08-08 NOTE — Progress Notes (Signed)
Office Visit Note   Patient: Anita Duncan           Date of Birth: 21-Mar-1956           MRN: [id number] Visit Date: 08/07/2019              Requested by: Glendale Chard, [address] [place],  [place] [address] PCP: Glendale Chard, MD  Chief Complaint  Patient presents with  . Right Shoulder - Pain      HPI: Patient is seen for evaluation for her right shoulder with increased pain.  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  Patient states that she is getting better with physical therapy she states that she does take Relafen to help.  Assessment & Plan: Visit Diagnoses:  1. Arm pain, right     Plan: Patient is given a new note for physical therapy at [facility].  She is given a note to be out of work and return to work on 08/13/2019.  Patient's CA 17 was completed no change in her work restrictions when she returns to work rest as needed no use of machines no overhead work.  Follow-Up Instructions: Return if symptoms worsen or fail to improve.   Ortho Exam  Patient is alert, oriented, no adenopathy, well-dressed, normal affect, normal respiratory effort. Examination patient has subjective swelling of her right shoulder.  She has pain with range of motion.  No ecchymosis no bruising.  Imaging: No results found. No images are attached to the encounter.  Labs: Lab Results  Component Value Date   HGBA1C 5.9 (H) 02/20/2019   HGBA1C 5.9 (H) 08/14/2018   HGBA1C 5.9 (H) 02/13/2018   ESRSEDRATE 22 01/12/2011     Lab Results  Component Value Date   ALBUMIN 4.3 02/20/2019   ALBUMIN 4.4 02/13/2018   ALBUMIN 4.1 12/31/2011    No results found for: MG No results found for: VD25OH  No results found for: PREALBUMIN CBC EXTENDED Latest Ref Rng & Units 02/20/2019 02/13/2018 12/31/2011  WBC 3.4 - 10.8 x10E3/uL 6.7 6.5 8.2  RBC 3.77 - 5.28 x10E6/uL  4.11 4.34 4.37  HGB 11.1 - 15.9 g/dL 11.8 12.3 12.9  HCT 34.0 - 46.6 % 35.3 37.8 38.8  PLT 150 - 450 x10E3/uL 238 272 237  NEUTROABS 1.7 - 7.7 K/uL - - 5.5  LYMPHSABS 0.7 - 4.0 K/uL - - 2.2     Body mass index is 33.25 kg/m.  Orders:  No orders of the defined types were placed in this encounter.  No orders of the defined types were placed in this encounter.    Procedures: No procedures performed  Clinical Data: No additional findings.  ROS:  All other systems negative, except as noted in the HPI. Review of Systems  Objective: Vital Signs: Ht 5\' 1"  (1.549 m)   Wt 176 lb (79.8 kg)   BMI 33.25 kg/m   Specialty Comments:  No specialty comments available.  PMFS History: Patient Active Problem List   Diagnosis Date Noted  . Impingement syndrome of right shoulder 06/21/2016   Past Medical History:  Diagnosis Date  . Hypothyroidism     Family History  Problem Relation Age of Onset  . Breast cancer Cousin 31    Past Surgical History:  Procedure Laterality Date  . BREAST BIOPSY Right   . BREAST BIOPSY Right   .  BREAST EXCISIONAL BIOPSY Right   . BREAST LUMPECTOMY    . CESAREAN SECTION     Social History   Occupational History  . Not on file  Tobacco Use  . Smoking status: Current Every Day Smoker    Packs/day: 0.50    Years: 44.00    Pack years: 22.00    Types: Cigarettes  . Smokeless tobacco: Former Neurosurgeon  . Tobacco comment: Encouraged to cut back on number of cigs smoked  Substance and Sexual Activity  . Alcohol use: Yes    Alcohol/week: 1.0 standard drinks    Types: 1 Glasses of wine per week    Comment: 2 x a year  . Drug use: No  . Sexual activity: Not Currently

## 2019-08-20 ENCOUNTER — Other Ambulatory Visit: Payer: Self-pay | Admitting: Internal Medicine

## 2019-08-21 ENCOUNTER — Encounter: Payer: Self-pay | Admitting: Internal Medicine

## 2019-08-21 ENCOUNTER — Ambulatory Visit: Payer: Federal, State, Local not specified - PPO | Admitting: Internal Medicine

## 2019-08-21 ENCOUNTER — Other Ambulatory Visit: Payer: Self-pay

## 2019-08-21 VITALS — BP 126/88 | HR 83 | Temp 97.5°F | Ht 61.0 in | Wt 174.0 lb

## 2019-08-21 DIAGNOSIS — E039 Hypothyroidism, unspecified: Secondary | ICD-10-CM

## 2019-08-21 DIAGNOSIS — R7309 Other abnormal glucose: Secondary | ICD-10-CM

## 2019-08-21 DIAGNOSIS — Z6832 Body mass index (BMI) 32.0-32.9, adult: Secondary | ICD-10-CM

## 2019-08-21 DIAGNOSIS — F17200 Nicotine dependence, unspecified, uncomplicated: Secondary | ICD-10-CM

## 2019-08-21 DIAGNOSIS — E6609 Other obesity due to excess calories: Secondary | ICD-10-CM

## 2019-08-21 NOTE — Patient Instructions (Signed)
Exercising to Stay Healthy To become healthy and stay healthy, it is recommended that you do moderate-intensity and vigorous-intensity exercise. You can tell that you are exercising at a moderate intensity if your heart starts beating faster and you start breathing faster but can still hold a conversation. You can tell that you are exercising at a vigorous intensity if you are breathing much harder and faster and cannot hold a conversation while exercising. Exercising regularly is important. It has many health benefits, such as:  Improving overall fitness, flexibility, and endurance.  Increasing bone density.  Helping with weight control.  Decreasing body fat.  Increasing muscle strength.  Reducing stress and tension.  Improving overall health. How often should I exercise? Choose an activity that you enjoy, and set realistic goals. Your health care provider can help you make an activity plan that works for you. Exercise regularly as told by your health care provider. This may include:  Doing strength training two times a week, such as: ? Lifting weights. ? Using resistance bands. ? Push-ups. ? Sit-ups. ? Yoga.  Doing a certain intensity of exercise for a given amount of time. Choose from these options: ? A total of 150 minutes of moderate-intensity exercise every week. ? A total of 75 minutes of vigorous-intensity exercise every week. ? A mix of moderate-intensity and vigorous-intensity exercise every week. Children, pregnant women, people who have not exercised regularly, people who are overweight, and older adults may need to talk with a health care provider about what activities are safe to do. If you have a medical condition, be sure to talk with your health care provider before you start a new exercise program. What are some exercise ideas? Moderate-intensity exercise ideas include:  Walking 1 mile (1.6 km) in about 15  minutes.  Biking.  Hiking.  Golfing.  Dancing.  Water aerobics. Vigorous-intensity exercise ideas include:  Walking 4.5 miles (7.2 km) or more in about 1 hour.  Jogging or running 5 miles (8 km) in about 1 hour.  Biking 10 miles (16.1 km) or more in about 1 hour.  Lap swimming.  Roller-skating or in-line skating.  Cross-country skiing.  Vigorous competitive sports, such as football, basketball, and soccer.  Jumping rope.  Aerobic dancing. What are some everyday activities that can help me to get exercise?  Yard work, such as: ? Pushing a lawn mower. ? Raking and bagging leaves.  Washing your car.  Pushing a stroller.  Shoveling snow.  Gardening.  Washing windows or floors. How can I be more active in my day-to-day activities?  Use stairs instead of an elevator.  Take a walk during your lunch break.  If you drive, park your car farther away from your work or school.  If you take public transportation, get off one stop early and walk the rest of the way.  Stand up or walk around during all of your indoor phone calls.  Get up, stretch, and walk around every 30 minutes throughout the day.  Enjoy exercise with a friend. Support to continue exercising will help you keep a regular routine of activity. What guidelines can I follow while exercising?  Before you start a new exercise program, talk with your health care provider.  Do not exercise so much that you hurt yourself, feel dizzy, or get very short of breath.  Wear comfortable clothes and wear shoes with good support.  Drink plenty of water while you exercise to prevent dehydration or heat stroke.  Work out until your breathing   and your heartbeat get faster. Where to find more information  [place] Department of Health and Human Services: www.hhs.gov  Centers for Disease Control and Prevention (CDC): www.cdc.gov Summary  Exercising regularly is important. It will improve your overall fitness,  flexibility, and endurance.  Regular exercise also will improve your overall health. It can help you control your weight, reduce stress, and improve your bone density.  Do not exercise so much that you hurt yourself, feel dizzy, or get very short of breath.  Before you start a new exercise program, talk with your health care provider. This information is not intended to replace advice given to you by your health care provider. Make sure you discuss any questions you have with your health care provider. Document Revised: 04/15/2017 Document Reviewed: 03/24/2017 Elsevier Patient Education  2020 Elsevier Inc.  

## 2019-08-21 NOTE — Progress Notes (Signed)
This visit occurred during the SARS-CoV-2 public health emergency.  Safety protocols were in place, including screening questions prior to the visit, additional usage of staff PPE, and extensive cleaning of exam room while observing appropriate contact time as indicated for disinfecting solutions.  Subjective:     Patient ID: Anita Duncan , female    DOB: [birth year] , 64 y.o.   MRN: [id number]   Chief Complaint  Patient presents with  . Hypothyroidism    HPI  She presents today for thyroid check. She reports compliance with meds. She has had both COVID vaccines. She had fever/chills with her second vaccine.   Thyroid Problem Presents for follow-up visit. Patient reports no cold intolerance or constipation. The symptoms have been stable.     Past Medical History:  Diagnosis Date  . Hypothyroidism      Family History  Problem Relation Age of Onset  . Breast cancer Cousin 31     Current Outpatient Medications:  .  Chlorphen-PE-Acetaminophen (NOREL AD) 4-10-325 MG TABS, TAKE ONE TABLET BY MOUTH TWICE A DAY, Disp: 20 tablet, Rfl: 0 .  levocetirizine (XYZAL) 5 MG tablet, TAKE 1 TABLET BY MOUTH EVERY DAY IN THE EVENING, Disp: 90 tablet, Rfl: 1 .  SYNTHROID 100 MCG tablet, TAKE 1 TABLET FRIDAY       THROUGH SUNDAY, Disp: 39 tablet, Rfl: 1 .  SYNTHROID 88 MCG tablet, TAKE 1 TABLET MONDAY       THROUGH THURSDAY, Disp: 52 tablet, Rfl: 0   Allergies  Allergen Reactions  . Codeine Nausea Only  . Sulfa Antibiotics Nausea And Vomiting     Review of Systems  Constitutional: Negative.   Respiratory: Negative.   Cardiovascular: Negative.   Gastrointestinal: Negative.  Negative for constipation.  Endocrine: Negative for cold intolerance.  Neurological: Negative.   Psychiatric/Behavioral: Negative.      Today's Vitals   08/21/19 1446  BP: 126/88  Pulse: 83  Temp: (!) 97.5 F (36.4 C)  TempSrc: Oral  SpO2: 94%  Weight: 174 lb (78.9 kg)  Height: 5' 1"  (1.549 m)   Body  mass index is 32.88 kg/m.   Objective:  Physical Exam Vitals and nursing note reviewed.  Constitutional:      Appearance: Normal appearance. She is obese.  HENT:     Head: Normocephalic and atraumatic.  Cardiovascular:     Rate and Rhythm: Normal rate and regular rhythm.     Heart sounds: Normal heart sounds.  Pulmonary:     Effort: Pulmonary effort is normal.     Breath sounds: Normal breath sounds.  Skin:    General: Skin is warm.  Neurological:     General: No focal deficit present.     Mental Status: She is alert.  Psychiatric:        Mood and Affect: Mood normal.        Behavior: Behavior normal.         Assessment And Plan:     1. Primary hypothyroidism  I will check thyroid panel and adjust meds as needed.  - TSH  2. Other abnormal glucose  HER A1C HAS BEEN ELEVATED IN THE PAST. I WILL CHECK AN A1C, BMET TODAY. SHE WAS ENCOURAGED TO AVOID SUGARY BEVERAGES AND PROCESSED FOODS INCLUDNG BREADS, RICE AND PASTA.  - Hemoglobin A1c - BMP8+EGFR  3. Class 1 obesity due to excess calories with serious comorbidity and body mass index (BMI) of 32.0 to 32.9 in adult  She is encouraged to strive for BMI  less than 30 to decrease cardiac risk. She is advised to exercise at least 30 minutes five days per week.   4. Tobacco use disorder  She has greater than 20 pack-year history of tobacco use.   - CT CHEST LUNG CA SCREEN LOW DOSE W/O CM; Future   Maximino Greenland, MD    THE PATIENT IS ENCOURAGED TO PRACTICE SOCIAL DISTANCING DUE TO THE COVID-19 PANDEMIC.

## 2019-08-22 LAB — BMP8+EGFR
BUN/Creatinine Ratio: 17 (ref 12–28)
BUN: 13 mg/dL (ref 8–27)
CO2: 23 mmol/L (ref 20–29)
Calcium: 9.1 mg/dL (ref 8.7–10.3)
Chloride: 105 mmol/L (ref 96–106)
Creatinine, Ser: 0.78 mg/dL (ref 0.57–1.00)
GFR calc Af Amer: 94 mL/min/{1.73_m2} (ref >59)
GFR calc non Af Amer: 81 mL/min/{1.73_m2} (ref >59)
Glucose: 83 mg/dL (ref 65–99)
Potassium: 4.2 mmol/L (ref 3.5–5.2)
Sodium: 141 mmol/L (ref 134–144)

## 2019-08-22 LAB — HEMOGLOBIN A1C
Est. average glucose Bld gHb Est-mCnc: 123 mg/dL
Hgb A1c MFr Bld: 5.9 % — ABNORMAL HIGH (ref 4.8–5.6)

## 2019-08-22 LAB — TSH: TSH: 2.8 u[IU]/mL (ref 0.450–4.500)

## 2019-09-17 ENCOUNTER — Ambulatory Visit
Admission: RE | Admit: 2019-09-17 | Discharge: 2019-09-17 | Disposition: A | Discharge status: Home / Self Care | Payer: Federal, State, Local not specified - PPO | Source: Ambulatory Visit | Attending: Internal Medicine | Admitting: Internal Medicine

## 2019-09-17 ENCOUNTER — Other Ambulatory Visit: Payer: Self-pay | Admitting: Internal Medicine

## 2019-09-17 ENCOUNTER — Ambulatory Visit: Payer: Federal, State, Local not specified - PPO

## 2019-09-17 DIAGNOSIS — F17200 Nicotine dependence, unspecified, uncomplicated: Secondary | ICD-10-CM

## 2019-09-17 DIAGNOSIS — F17210 Nicotine dependence, cigarettes, uncomplicated: Secondary | ICD-10-CM | POA: Diagnosis not present

## 2019-10-25 ENCOUNTER — Other Ambulatory Visit: Payer: Self-pay

## 2019-10-25 ENCOUNTER — Encounter (HOSPITAL_BASED_OUTPATIENT_CLINIC_OR_DEPARTMENT_OTHER): Payer: Self-pay

## 2019-10-25 ENCOUNTER — Emergency Department (HOSPITAL_BASED_OUTPATIENT_CLINIC_OR_DEPARTMENT_OTHER)
Admission: EM | Admit: 2019-10-25 | Discharge: 2019-10-26 | Disposition: A | Discharge status: Home / Self Care | Payer: Federal, State, Local not specified - PPO | Attending: Emergency Medicine | Admitting: Emergency Medicine

## 2019-10-25 DIAGNOSIS — H6692 Otitis media, unspecified, left ear: Secondary | ICD-10-CM | POA: Diagnosis not present

## 2019-10-25 DIAGNOSIS — F17210 Nicotine dependence, cigarettes, uncomplicated: Secondary | ICD-10-CM | POA: Insufficient documentation

## 2019-10-25 DIAGNOSIS — J302 Other seasonal allergic rhinitis: Secondary | ICD-10-CM | POA: Diagnosis not present

## 2019-10-25 DIAGNOSIS — H6592 Unspecified nonsuppurative otitis media, left ear: Secondary | ICD-10-CM

## 2019-10-25 DIAGNOSIS — H9202 Otalgia, left ear: Secondary | ICD-10-CM | POA: Diagnosis not present

## 2019-10-25 DIAGNOSIS — Z7952 Long term (current) use of systemic steroids: Secondary | ICD-10-CM | POA: Insufficient documentation

## 2019-10-25 MED ORDER — AMOXICILLIN-POT CLAVULANATE 875-125 MG PO TABS: 1.0000 | ORAL_TABLET | Freq: Two times a day (BID) | ORAL | 0 refills | Status: DC

## 2019-10-25 MED ORDER — IBUPROFEN 800 MG PO TABS
800.0000 mg | ORAL_TABLET | Freq: Once | ORAL | Status: DC
  Filled 2019-10-25: qty 1

## 2019-10-25 MED ORDER — AMOXICILLIN-POT CLAVULANATE 875-125 MG PO TABS
1.0000 | ORAL_TABLET | Freq: Once | ORAL | Status: AC
  Administered 2019-10-26: 1 via ORAL
  Filled 2019-10-25: qty 1

## 2019-10-25 NOTE — Discharge Instructions (Addendum)
I recommend continuing your Xyzal as prescribed and Flonase nasal spray twice daily.  You may use Benadryl before going to sleep to help with discomfort and drainage of your effusion (fluid behind your eardrum).    You may alternate Tylenol 1000 mg every 6 hours as needed for pain, fever and Ibuprofen 800 mg every 8 hours as needed for pain, fever.  Please take Ibuprofen with food.  Do not take more than 4000 mg of Tylenol (acetaminophen) in a 24 hour period.

## 2019-10-25 NOTE — ED Triage Notes (Signed)
Pt states she has "sinuses" and they have been bothering her. Pt states she has an intermittent pain to the L ear.

## 2019-10-25 NOTE — ED Provider Notes (Signed)
TIME SEEN: 11:42 PM  CHIEF COMPLAINT: Left ear pain  HPI: Patient is a 64 year old female with history of hypothyroidism who presents to the emergency department with complaints of left ear pain for 2 days.  States she has a hard time hearing out of it feels full.  No fevers but has had some sweats at night.  No cough.  She is on Xyzal for seasonal allergies.  She also reports having Flonase at home.  ROS: See HPI Constitutional: no fever  Eyes: no drainage  ENT: no runny nose   Cardiovascular:  no chest pain  Resp: no SOB  GI: no vomiting GU: no dysuria Integumentary: no rash  Allergy: no hives  Musculoskeletal: no leg swelling  Neurological: no slurred speech ROS otherwise negative  PAST MEDICAL HISTORY/PAST SURGICAL HISTORY:  Past Medical History:  Diagnosis Date  . Hypothyroidism     MEDICATIONS:  Prior to Admission medications   Medication Sig Start Date End Date Taking? Authorizing Provider  Chlorphen-PE-Acetaminophen (NOREL AD) 4-10-325 MG TABS TAKE ONE TABLET BY MOUTH TWICE A DAY 02/26/19   Rodriguez-Southworth, Sunday Spillers, PA-C  levocetirizine (XYZAL) 5 MG tablet TAKE 1 TABLET BY MOUTH EVERY DAY IN THE EVENING 02/19/19   Glendale Chard, MD  SYNTHROID 100 MCG tablet TAKE 1 TABLET FRIDAY       THROUGH SUNDAY 03/31/19   Glendale Chard, MD  SYNTHROID 88 MCG tablet TAKE 1 TABLET MONDAY       THROUGH THURSDAY 08/20/19   Glendale Chard, MD    ALLERGIES:  Allergies  Allergen Reactions  . Codeine Nausea Only  . Sulfa Antibiotics Nausea And Vomiting    SOCIAL HISTORY:  Social History   Tobacco Use  . Smoking status: Current Every Day Smoker    Packs/day: 0.50    Years: 44.00    Pack years: 22.00    Types: Cigarettes  . Smokeless tobacco: Former Systems developer  . Tobacco comment: Encouraged to cut back on number of cigs smoked  Substance Use Topics  . Alcohol use: Yes    Alcohol/week: 1.0 standard drink    Types: 1 Glasses of wine per week    Comment: 2 x a year    FAMILY  HISTORY: Family History  Problem Relation Age of Onset  . Breast cancer Cousin 31    EXAM: BP (!) 153/87 (BP Location: Left Arm)   Pulse 93   Temp 98.5 F (36.9 C) (Oral)   Resp 16   Ht 5\' 2"  (1.575 m)   Wt 76.7 kg   SpO2 95%   BMI 30.91 kg/m  CONSTITUTIONAL: Alert and oriented and responds appropriately to questions. Well-appearing; well-nourished HEAD: Normocephalic EYES: Conjunctivae clear, pupils appear equal, EOM appear intact ENT: normal nose; moist mucous membranes; No pharyngeal erythema or petechiae, no tonsillar hypertrophy or exudate, no uvular deviation, no unilateral swelling, no trismus or drooling, no muffled voice, normal phonation, no stridor, no dental caries present, no drainable dental abscess noted, no Ludwig's angina, tongue sits flat in the bottom of the mouth, no angioedema, no facial erythema or warmth, no facial swelling; no pain with movement of the neck, no cervical LAD.  Right TM is clear without erythema, purulence, bulging, perforation, effusion.  Left TM is bulging with large cloudy appearing effusion without significant redness.  No perforation of the left TM.  No cerumen impaction or sign of foreign body in the external auditory canal. No inflammation, erythema or drainage from the external auditory canal. No signs of mastoiditis. No pain with  manipulation of the pinna bilaterally. NECK: Supple, normal ROM CARD: RRR; S1 and S2 appreciated; no murmurs, no clicks, no rubs, no gallops RESP: Normal chest excursion without splinting or tachypnea; breath sounds clear and equal bilaterally; no wheezes, no rhonchi, no rales, no hypoxia or respiratory distress, speaking full sentences ABD/GI: Normal bowel sounds; non-distended; soft, non-tender, no rebound, no guarding, no peritoneal signs, no hepatosplenomegaly BACK:  The back appears normal EXT: Normal ROM in all joints; no deformity noted, no edema; no cyanosis SKIN: Normal color for age and race; warm; no rash  on exposed skin NEURO: Moves all extremities equally PSYCH: The patient's mood and manner are appropriate.   MEDICAL DECISION MAKING: Patient here with large left ear effusion.  Given sudden onset and night sweats, will cover with antibiotics for potential bacterial causes.  Have encouraged her to continue her Xyzal and begin using Flonase which she has at home.  Recommended alternating Tylenol and Motrin.  Discussed with patient that if she is not having improvement in 1 to 2 weeks she should follow-up with her PCP and potentially ENT.  Recommended other antihistamines such as Benadryl at night to help with any drainage.  At this time, I do not feel there is any life-threatening condition present. I have reviewed, interpreted and discussed all results (EKG, imaging, lab, urine as appropriate) and exam findings with patient/family. I have reviewed nursing notes and appropriate previous records.  I feel the patient is safe to be discharged home without further emergent workup and can continue workup as an outpatient as needed. Discussed usual and customary return precautions. Patient/family verbalize understanding and are comfortable with this plan.  Outpatient follow-up has been provided as needed. All questions have been answered.    Anita Duncan was evaluated in Emergency Department on 10/25/2019 for the symptoms described in the history of present illness. She was evaluated in the context of the global COVID-19 pandemic, which necessitated consideration that the patient might be at risk for infection with the SARS-CoV-2 virus that causes COVID-19. Institutional protocols and algorithms that pertain to the evaluation of patients at risk for COVID-19 are in a state of rapid change based on information released by regulatory bodies including the CDC and federal and state organizations. These policies and algorithms were followed during the patient's care in the ED.      [name], Layla Maw, DO 10/26/19  0015

## 2019-11-08 ENCOUNTER — Other Ambulatory Visit: Payer: Self-pay

## 2019-11-08 ENCOUNTER — Encounter: Payer: Self-pay | Admitting: Internal Medicine

## 2019-11-08 ENCOUNTER — Ambulatory Visit: Payer: Federal, State, Local not specified - PPO | Admitting: Internal Medicine

## 2019-11-08 VITALS — BP 142/82 | HR 78 | Temp 97.8°F | Ht 62.0 in | Wt 177.8 lb

## 2019-11-08 DIAGNOSIS — H6690 Otitis media, unspecified, unspecified ear: Secondary | ICD-10-CM | POA: Diagnosis not present

## 2019-11-08 DIAGNOSIS — H9202 Otalgia, left ear: Secondary | ICD-10-CM

## 2019-11-08 MED ORDER — CIPROFLOXACIN-FLUOCINOLONE PF 0.3-0.025 % OT SOLN: 0.2500 mL | Freq: Two times a day (BID) | OTIC | 0 refills | Status: DC

## 2019-11-12 ENCOUNTER — Telehealth: Payer: Self-pay

## 2019-11-12 MED ORDER — FLUTICASONE PROPIONATE 50 MCG/ACT NA SUSP: 1.0000 | Freq: Every day | NASAL | 2 refills | Status: DC

## 2019-11-12 NOTE — Telephone Encounter (Signed)
The pt was notified that Dr. Allyne Gee said that the pt can try tylenol 500 mg for her ear pain and yes it's ok for the pt to use the ear drops in her right ear too.

## 2019-11-13 ENCOUNTER — Ambulatory Visit: Payer: Federal, State, Local not specified - PPO | Admitting: Internal Medicine

## 2019-11-13 NOTE — Progress Notes (Signed)
This visit occurred during the SARS-CoV-2 public health emergency.  Safety protocols were in place, including screening questions prior to the visit, additional usage of staff PPE, and extensive cleaning of exam room while observing appropriate contact time as indicated for disinfecting solutions.  Subjective:     Patient ID: Anita Duncan , female    DOB: [birth year] , 64 y.o.   MRN: [id number]   Chief Complaint  Patient presents with  . Otalgia    HPI  She presents today for further evaluation of an ear infection/sinus infection. She went to [facility] ER on 6/10 for further evaluation of left ear pain. She reports that the pain was debilitating. She also had decreased hearing. She was diagnosed with acute otitis media and prescribed Augmentin. Advised to seek further evaluation if her sx persisted 1-2 weeks. She is still having some ear pain. Her hearing has returned. She wants to see a specialist.   Otalgia  There is pain in the left ear. The current episode started 1 to 4 weeks ago. The problem occurs constantly. The problem has been unchanged. There has been no fever. The pain is at a severity of 6/10. The pain is moderate. Pertinent negatives include no coughing, diarrhea, neck pain, rhinorrhea or sore throat. She has tried antibiotics for the symptoms. The treatment provided mild relief.     Past Medical History:  Diagnosis Date  . Hypothyroidism      Family History  Problem Relation Age of Onset  . Breast cancer Cousin 31     Current Outpatient Medications:  .  Chlorphen-PE-Acetaminophen (NOREL AD) 4-10-325 MG TABS, TAKE ONE TABLET BY MOUTH TWICE A DAY, Disp: 20 tablet, Rfl: 0 .  levocetirizine (XYZAL) 5 MG tablet, TAKE 1 TABLET BY MOUTH EVERY DAY IN THE EVENING, Disp: 90 tablet, Rfl: 1 .  SYNTHROID 100 MCG tablet, TAKE 1 TABLET FRIDAY       THROUGH SUNDAY, Disp: 39 tablet, Rfl: 1 .  SYNTHROID 88 MCG tablet, TAKE 1 TABLET MONDAY       THROUGH THURSDAY, Disp: 52 tablet,  Rfl: 0 .  ciprofloxacin-fluocinolone PF (OTOVEL) 0.3-0.025 % SOLN, Place 0.25 mLs into the left ear 2 (two) times daily., Disp: 14 each, Rfl: 0 .  fluticasone (FLONASE) 50 MCG/ACT nasal spray, Place 1 spray into both nostrils daily., Disp: 16 g, Rfl: 2   Allergies  Allergen Reactions  . Codeine Nausea Only  . Sulfa Antibiotics Nausea And Vomiting     Review of Systems  Constitutional: Negative.   HENT: Positive for ear pain. Negative for rhinorrhea and sore throat.   Respiratory: Negative.  Negative for cough.   Cardiovascular: Negative.   Gastrointestinal: Negative.  Negative for diarrhea.  Musculoskeletal: Negative for neck pain.  Neurological: Negative.   Psychiatric/Behavioral: Negative.      Today's Vitals   11/08/19 1204  BP: (!) 142/82  Pulse: 78  Temp: 97.8 F (36.6 C)  TempSrc: Oral  Weight: 177 lb 12.8 oz (80.6 kg)  Height: 5\' 2"  (1.575 m)   Body mass index is 32.52 kg/m.   Objective:  Physical Exam Vitals and nursing note reviewed.  Constitutional:      Appearance: Normal appearance.  HENT:     Head: Normocephalic and atraumatic.     Right Ear: Tympanic membrane, ear canal and external ear normal. There is no impacted cerumen.     Left Ear: Ear canal and external ear normal. No decreased hearing noted. Tenderness present. No drainage. There is no impacted  cerumen. Tympanic membrane is bulging.  Cardiovascular:     Rate and Rhythm: Normal rate and regular rhythm.     Heart sounds: Normal heart sounds.  Pulmonary:     Effort: Pulmonary effort is normal.     Breath sounds: Normal breath sounds.  Skin:    General: Skin is warm.  Neurological:     General: No focal deficit present.     Mental Status: She is alert.  Psychiatric:        Mood and Affect: Mood normal.        Behavior: Behavior normal.         Assessment And Plan:     1. Acute otitis media, unspecified otitis media type  She is having persistent symptoms. She has almost completed full  course of oral abx, she is encouraged to take last pill(s).  She was given rx Cipro Otic. I will also refer her to ENT for further evaluation.   - Ambulatory referral to ENT  2. Acute otalgia, left  Persistent. Encouraged to continue with Xyzal and Flonase.   [name], MD    THE PATIENT IS ENCOURAGED TO PRACTICE SOCIAL DISTANCING DUE TO THE COVID-19 PANDEMIC.

## 2019-11-13 NOTE — Patient Instructions (Signed)

## 2019-11-21 ENCOUNTER — Telehealth: Payer: Self-pay | Admitting: Orthopedic Surgery

## 2019-11-21 ENCOUNTER — Encounter: Payer: Self-pay | Admitting: Orthopedic Surgery

## 2019-11-21 NOTE — Telephone Encounter (Signed)
Pt called stating she will be dropping off a letter for autumn later this afternoon and was just letting us know ahead of time it's on timed schedule.  [phone]

## 2019-11-22 ENCOUNTER — Ambulatory Visit (INDEPENDENT_AMBULATORY_CARE_PROVIDER_SITE_OTHER): Payer: Federal, State, Local not specified - PPO | Admitting: Otolaryngology

## 2019-11-22 ENCOUNTER — Other Ambulatory Visit: Payer: Self-pay

## 2019-11-22 ENCOUNTER — Encounter: Payer: Self-pay | Admitting: Orthopedic Surgery

## 2019-11-22 ENCOUNTER — Encounter (INDEPENDENT_AMBULATORY_CARE_PROVIDER_SITE_OTHER): Payer: Self-pay | Admitting: Otolaryngology

## 2019-11-22 VITALS — Temp 97.5°F

## 2019-11-22 DIAGNOSIS — H6123 Impacted cerumen, bilateral: Secondary | ICD-10-CM | POA: Diagnosis not present

## 2019-11-22 DIAGNOSIS — H60311 Diffuse otitis externa, right ear: Secondary | ICD-10-CM | POA: Diagnosis not present

## 2019-11-22 NOTE — Progress Notes (Signed)
HPI: Anita Duncan is a 64 y.o. female who presents is referred by Dr. Allyne Gee for evaluation of ear infection..  She developed ear infection several weeks ago and was initially treated with Augmentin.  Then she was treated with antibiotic eardrops for otitis externa.  The ears are doing better presently but still little blocked.  She also complains of chronic itching in the ears.  Past Medical History:  Diagnosis Date  . Hypothyroidism    Past Surgical History:  Procedure Laterality Date  . BREAST BIOPSY Right   . BREAST BIOPSY Right   . BREAST EXCISIONAL BIOPSY Right   . BREAST LUMPECTOMY    . CESAREAN SECTION     Social History   Socioeconomic History  . Marital status: Single    Spouse name: Not on file  . Number of children: Not on file  . Years of education: Not on file  . Highest education level: Not on file  Occupational History  . Not on file  Tobacco Use  . Smoking status: Current Every Day Smoker    Packs/day: 0.50    Years: 44.00    Pack years: 22.00    Types: Cigarettes  . Smokeless tobacco: Former Neurosurgeon  . Tobacco comment: Encouraged to cut back on number of cigs smoked  Vaping Use  . Vaping Use: Never used  Substance and Sexual Activity  . Alcohol use: Yes    Alcohol/week: 1.0 standard drink    Types: 1 Glasses of wine per week    Comment: 2 x a year  . Drug use: No  . Sexual activity: Not Currently  Other Topics Concern  . Not on file  Social History Narrative  . Not on file   Social Determinants of Health   Financial Resource Strain:   . Difficulty of Paying Living Expenses:   Food Insecurity:   . Worried About Programme researcher, broadcasting/film/video in the Last Year:   . Barista in the Last Year:   Transportation Needs:   . Freight forwarder (Medical):   Marland Kitchen Lack of Transportation (Non-Medical):   Physical Activity:   . Days of Exercise per Week:   . Minutes of Exercise per Session:   Stress:   . Feeling of Stress :   Social Connections:   .  Frequency of Communication with Friends and Family:   . Frequency of Social Gatherings with Friends and Family:   . Attends Religious Services:   . Active Member of Clubs or Organizations:   . Attends Banker Meetings:   Marland Kitchen Marital Status:    Family History  Problem Relation Age of Onset  . Breast cancer Cousin 31   Allergies  Allergen Reactions  . Codeine Nausea Only  . Sulfa Antibiotics Nausea And Vomiting   Prior to Admission medications   Medication Sig Start Date End Date Taking? Authorizing Provider  Chlorphen-PE-Acetaminophen (NOREL AD) 4-10-325 MG TABS TAKE ONE TABLET BY MOUTH TWICE A DAY 02/26/19  Yes Rodriguez-Southworth, Nettie Elm, PA-C  ciprofloxacin-fluocinolone PF (OTOVEL) 0.3-0.025 % SOLN Place 0.25 mLs into the left ear 2 (two) times daily. 11/08/19  Yes Dorothyann Peng, MD  fluticasone [facility]) 50 MCG/ACT nasal spray Place 1 spray into both nostrils daily. 11/12/19  Yes Dorothyann Peng, MD  levocetirizine (XYZAL) 5 MG tablet TAKE 1 TABLET BY MOUTH EVERY DAY IN THE EVENING 02/19/19  Yes Dorothyann Peng, MD  SYNTHROID 100 MCG tablet TAKE 1 TABLET FRIDAY       THROUGH SUNDAY  03/31/19  Yes Dorothyann Peng, MD  SYNTHROID 88 MCG tablet TAKE 1 TABLET MONDAY       THROUGH THURSDAY 08/20/19  Yes Dorothyann Peng, MD     Positive ROS: Otherwise negative  All other systems have been reviewed and were otherwise negative with the exception of those mentioned in the HPI and as above.  Physical Exam: Constitutional: Alert, well-appearing, no acute distress Ears: External ears without lesions or tenderness.  Left ear canal is clear except for some wax adjacent to the left TM that was removed with small pick and suction.  Ear canal and TM are otherwise clear.  On the right side patient still has a small amount of drainage and infection adjacent to the right TM that was cleaned with hydroperoxide and suction.  After cleaning the ear canal I applied gentian violet Ciprodex and CSF  powder to the right ear canal. Nasal: External nose without lesions.. Clear nasal passages Oral: Lips and gums without lesions. Tongue and palate mucosa without lesions. Posterior oropharynx clear. Neck: No palpable adenopathy or masses Respiratory: Breathing comfortably  Skin: No facial/neck lesions or rash noted.  Cerumen impaction removal  Date/Time: 11/22/2019 5:31 PM Performed by: Drema Halon, MD Authorized by: Drema Halon, MD   Consent:    Consent obtained:  Verbal   Consent given by:  Patient   Risks discussed:  Pain and bleeding Procedure details:    Location:  L ear and R ear   Procedure type: curette and suction   Post-procedure details:    Inspection:  TM intact and canal normal   Hearing quality:  Improved   Patient tolerance of procedure:  Tolerated well, no immediate complications Comments:     Small amount of wax was removed from the left TM.  Left ear canal is clear.  Right ear canal reveals still some thick debris adjacent to the left TM that was cleaned with suction.  After cleaning the ear canal on the left side I applied gentian violet Ciprodex and CSF powder.    Assessment: Resolved otitis externa on the left side.  Mild external otitis persistent on the right side.  Plan: I think she can stop the eardrops presently as I treated the right ear in the office today.  She has eardrops she can use if she has any further drainage or pain in the right ear.  Recommend keeping water out of the right ear for the next 3 days. Because of chronic itching in her ears I prescribed Diprolene 0.05% cream that she can apply to the ear twice daily as needed itching.   Narda Bonds, MD   CC:

## 2019-11-23 ENCOUNTER — Other Ambulatory Visit: Payer: Self-pay

## 2019-12-04 ENCOUNTER — Other Ambulatory Visit: Payer: Self-pay | Admitting: Internal Medicine

## 2020-01-06 ENCOUNTER — Other Ambulatory Visit: Payer: Self-pay | Admitting: Internal Medicine

## 2020-01-15 DIAGNOSIS — Z01419 Encounter for gynecological examination (general) (routine) without abnormal findings: Secondary | ICD-10-CM | POA: Diagnosis not present

## 2020-01-15 DIAGNOSIS — Z6831 Body mass index (BMI) 31.0-31.9, adult: Secondary | ICD-10-CM | POA: Diagnosis not present

## 2020-01-23 ENCOUNTER — Other Ambulatory Visit: Payer: Self-pay | Admitting: Obstetrics and Gynecology

## 2020-01-23 DIAGNOSIS — E2839 Other primary ovarian failure: Secondary | ICD-10-CM

## 2020-02-01 IMAGING — MG DIGITAL SCREENING BILATERAL MAMMOGRAM WITH TOMO AND CAD
6 of 10 series · 6 of 30 positions shown · non-contrast
Comparison: Previous exam(s).

CLINICAL DATA: Screening.

EXAM:
DIGITAL SCREENING BILATERAL MAMMOGRAM WITH TOMO AND CAD

[R CC synth-2D]
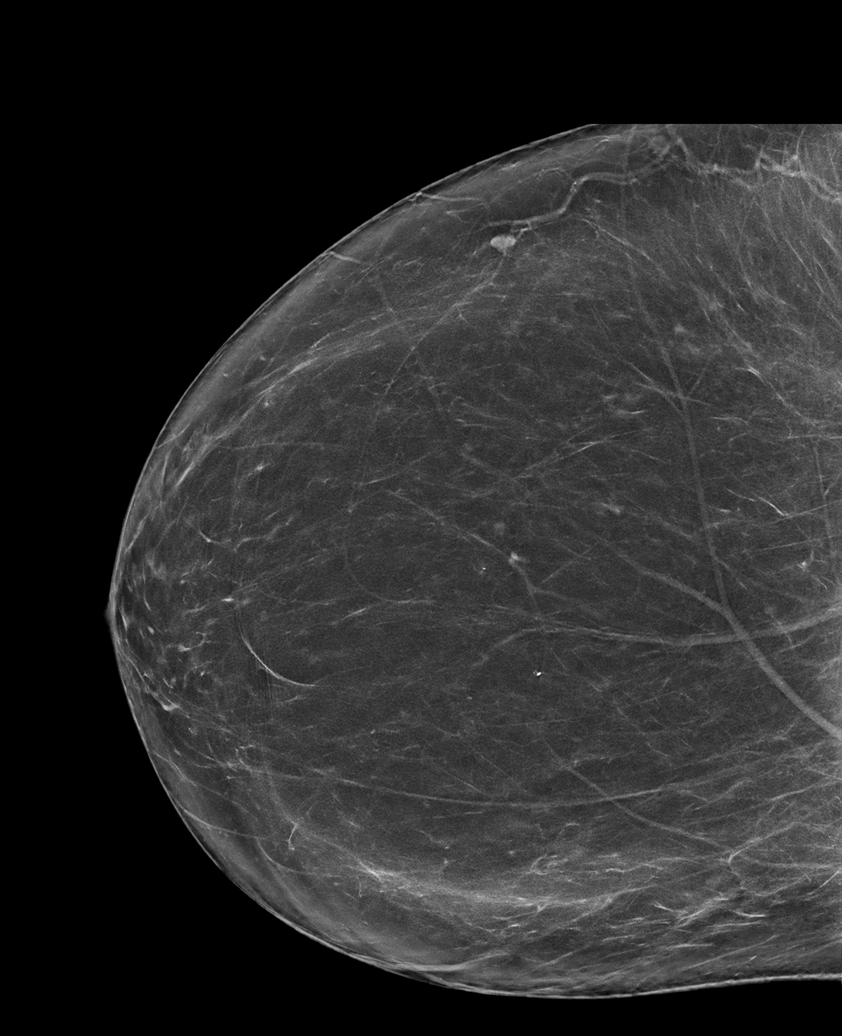

[L CC synth-2D]
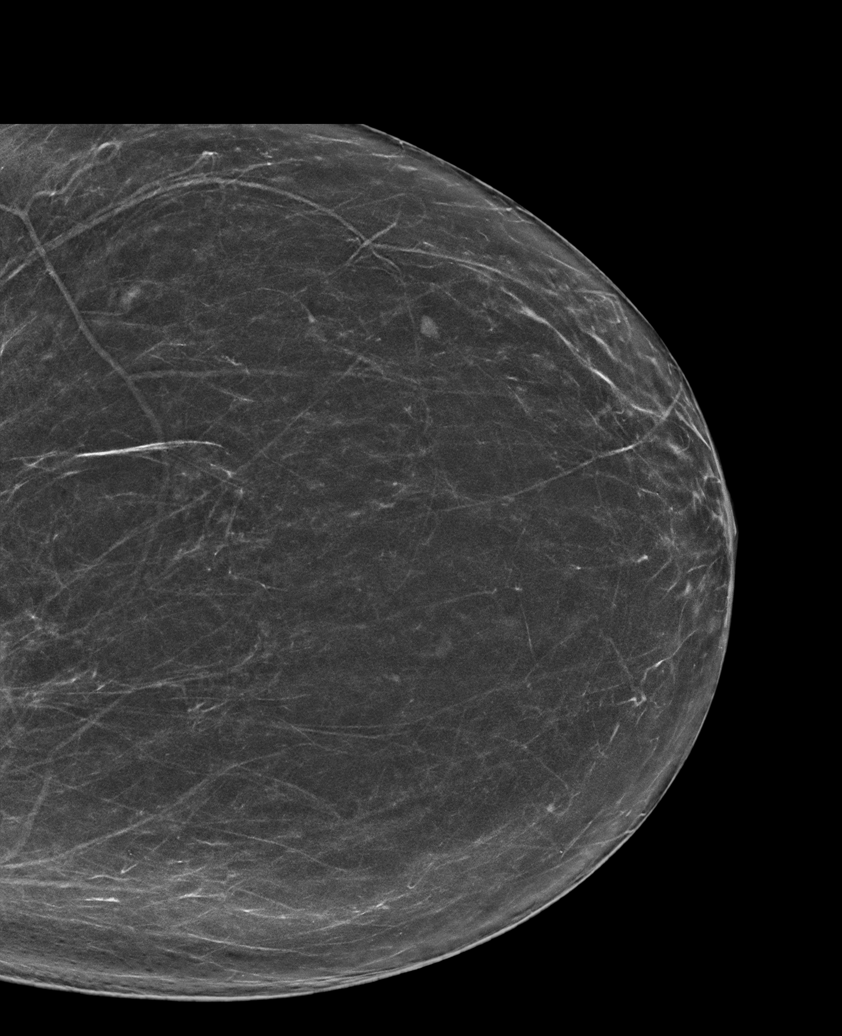

[R MLO synth-2D (1 of 2)]
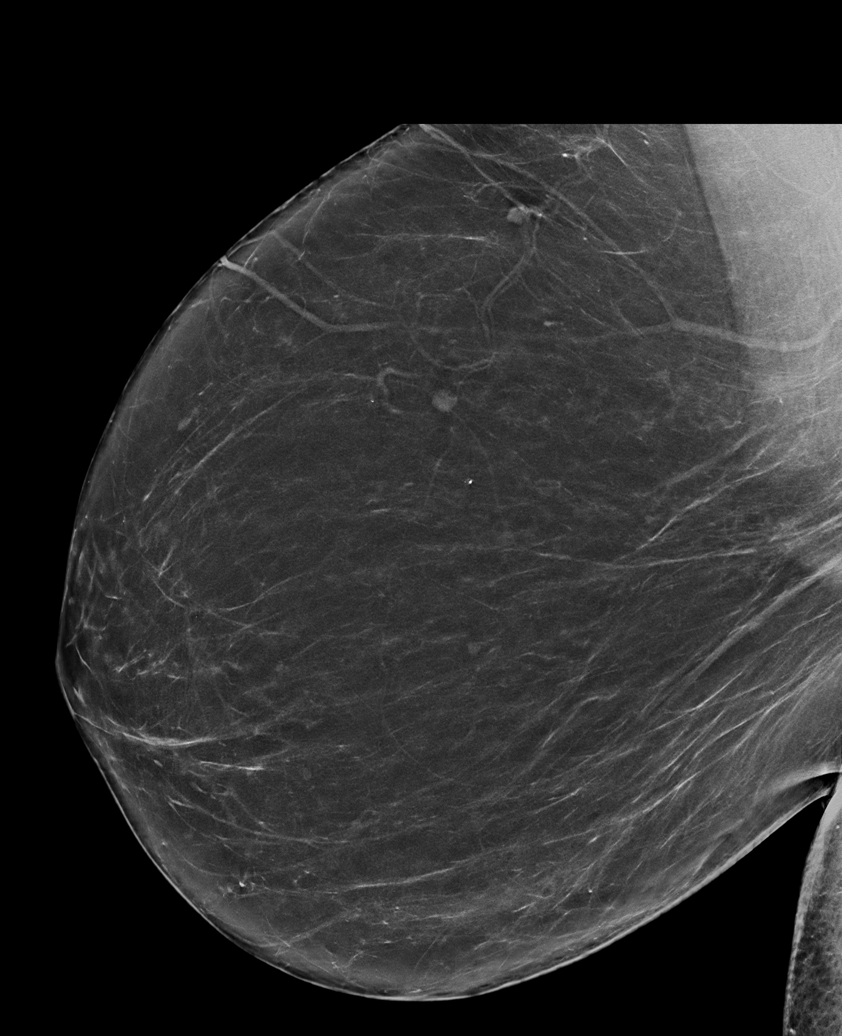

[L MLO synth-2D]
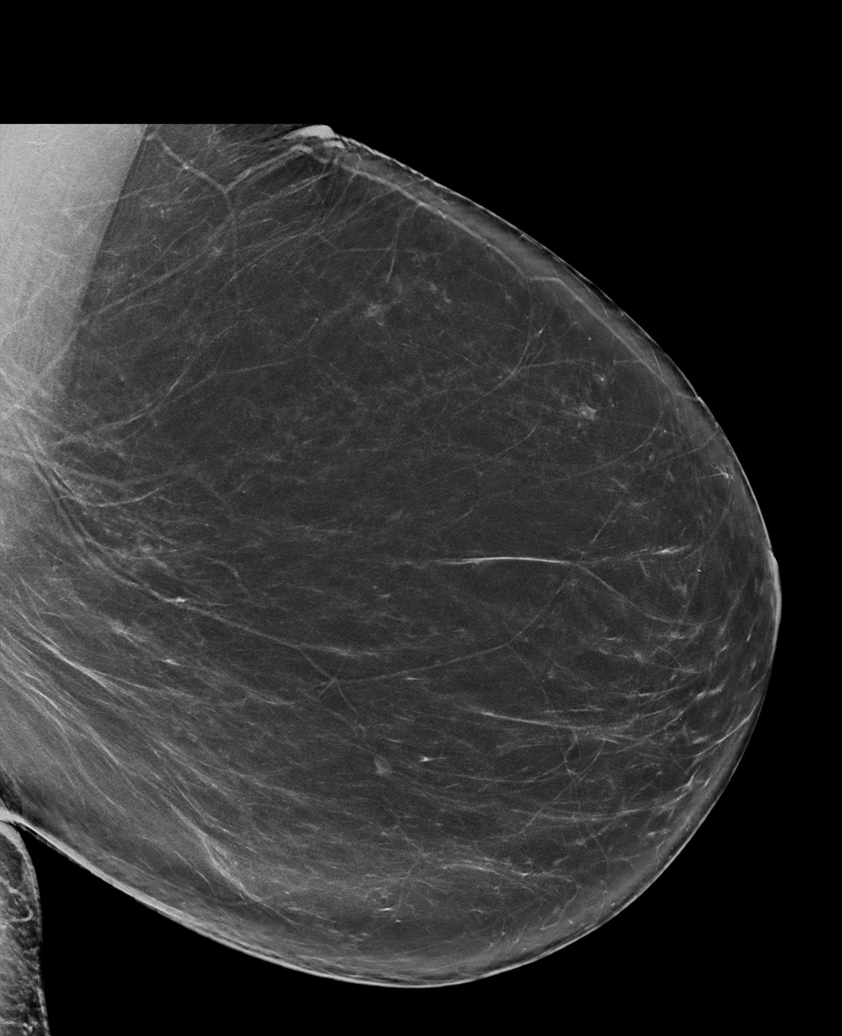

[R MLO synth-2D (2 of 2)]
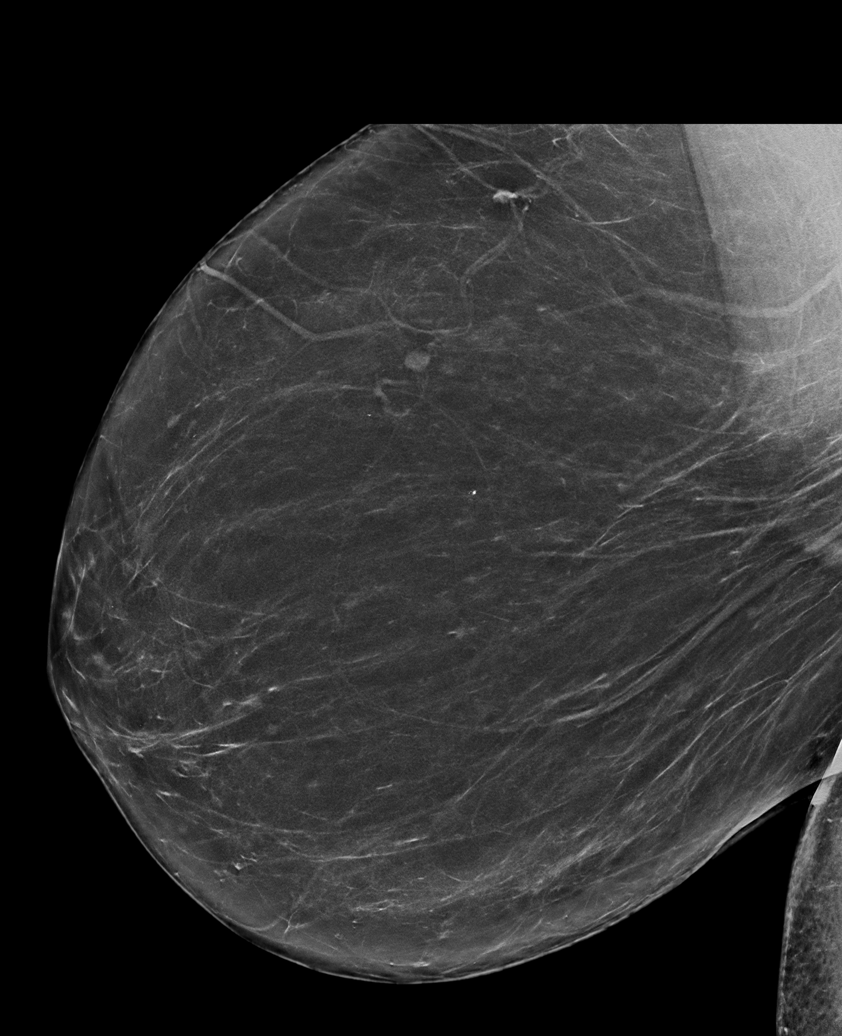

[R MLO tomo · tomo slice 42/83.0]
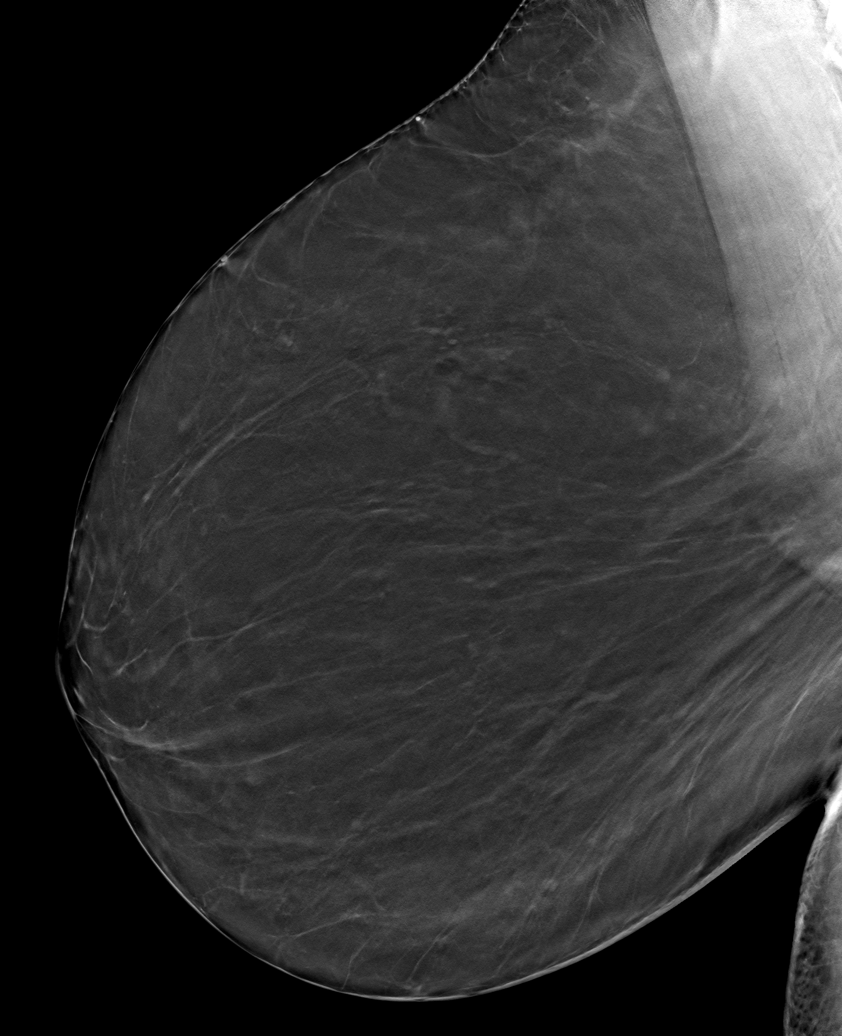

[6 of 30 positions shown; findings below may reference images not displayed]

ACR Breast Density Category b: There are scattered areas of
fibroglandular density.
FINDINGS: There are no findings suspicious for malignancy. Images were
processed with CAD.
IMPRESSION: No mammographic evidence of malignancy. A result letter of this
screening mammogram will be mailed directly to the patient.

RECOMMENDATION:
Screening mammogram in one year. (Code:[id number])

BI-RADS CATEGORY  1: Negative.

## 2020-02-11 ENCOUNTER — Encounter: Payer: Self-pay | Admitting: Orthopedic Surgery

## 2020-02-11 ENCOUNTER — Ambulatory Visit (INDEPENDENT_AMBULATORY_CARE_PROVIDER_SITE_OTHER): Admitting: Orthopedic Surgery

## 2020-02-11 DIAGNOSIS — M7541 Impingement syndrome of right shoulder: Secondary | ICD-10-CM | POA: Diagnosis not present

## 2020-02-11 MED ORDER — NABUMETONE 750 MG PO TABS: 750.0000 mg | ORAL_TABLET | Freq: Two times a day (BID) | ORAL | 3 refills | Status: AC | PRN

## 2020-02-11 NOTE — Progress Notes (Signed)
Office Visit Note   Patient: CHALEE Duncan           Date of Birth: 06-Apr-1956           MRN: [id number] Visit Date: 02/11/2020              Requested by: Dorothyann Peng, MD [address] [place],  [place] [address] PCP: Dorothyann Peng, MD  Chief Complaint  Patient presents with  . Right Shoulder - Pain      HPI: Patient is a 64 year old woman who presents with chronic impingement symptoms of the right shoulder she is here to recheck the shoulder she also states she has been having spasms in both thumbs while at work. Patient states that work has been more stressful.  Assessment & Plan: Visit Diagnoses:  1. Impingement syndrome of right shoulder     Plan: Patient was given instructions to use coconut water for the muscle cramping in her thumbs her paperwork was completed to continue her current restrictions of pulling pushing lifting with her arms and that she is not to use the machines. She is to take breaks as needed.  Follow-Up Instructions: Return if symptoms worsen or fail to improve.   Ortho Exam  Patient is alert, oriented, no adenopathy, well-dressed, normal affect, normal respiratory effort. Examination patient has decreased range of motion of right shoulder she has pain with impingement maneuvers. Examination of her hands they are neurovascular intact no radicular symptoms, reviewing the pictures she took of her thumbs shows muscle spasm and contractures during work.  Imaging: No results found. No images are attached to the encounter.  Labs: Lab Results  Component Value Date   HGBA1C 5.9 (H) 08/21/2019   HGBA1C 5.9 (H) 02/20/2019   HGBA1C 5.9 (H) 08/14/2018   ESRSEDRATE 22 01/12/2011     Lab Results  Component Value Date   ALBUMIN 4.3 02/20/2019   ALBUMIN 4.4 02/13/2018   ALBUMIN 4.1 12/31/2011    No results found for: MG No results found for: VD25OH  No results found for: PREALBUMIN CBC EXTENDED Latest Ref Rng & Units 02/20/2019  02/13/2018 12/31/2011  WBC 3.4 - 10.8 x10E3/uL 6.7 6.5 8.2  RBC 3.77 - 5.28 x10E6/uL 4.11 4.34 4.37  HGB 11.1 - 15.9 g/dL 35.5 73.2 20.2  HCT 54.2 - 46.6 % 35.3 37.8 38.8  PLT 150 - 450 x10E3/uL 238 272 237  NEUTROABS 1.7 - 7.7 K/uL - - 5.5  LYMPHSABS 0.7 - 4.0 K/uL - - 2.2     There is no height or weight on file to calculate BMI.  Orders:  No orders of the defined types were placed in this encounter.  Meds ordered this encounter  Medications  . nabumetone (RELAFEN) 750 MG tablet    Sig: Take 1 tablet (750 mg total) by mouth 2 (two) times daily as needed for mild pain or moderate pain. with food    Dispense:  60 tablet    Refill:  3     Procedures: No procedures performed  Clinical Data: No additional findings.  ROS:  All other systems negative, except as noted in the HPI. Review of Systems  Objective: Vital Signs: There were no vitals taken for this visit.  Specialty Comments:  No specialty comments available.  PMFS History: Patient Active Problem List   Diagnosis Date Noted  . Impingement syndrome of right shoulder 06/21/2016   Past Medical History:  Diagnosis Date  . Hypothyroidism     Family History  Problem  Relation Age of Onset  . Breast cancer Cousin 31    Past Surgical History:  Procedure Laterality Date  . BREAST BIOPSY Right   . BREAST BIOPSY Right   . BREAST EXCISIONAL BIOPSY Right   . BREAST LUMPECTOMY    . CESAREAN SECTION     Social History   Occupational History  . Not on file  Tobacco Use  . Smoking status: Current Every Day Smoker    Packs/day: 0.50    Years: 44.00    Pack years: 22.00    Types: Cigarettes  . Smokeless tobacco: Former Neurosurgeon  . Tobacco comment: Encouraged to cut back on number of cigs smoked  Vaping Use  . Vaping Use: Never used  Substance and Sexual Activity  . Alcohol use: Yes    Alcohol/week: 1.0 standard drink    Types: 1 Glasses of wine per week    Comment: 2 x a year  . Drug use: No  . Sexual  activity: Not Currently

## 2020-02-15 ENCOUNTER — Other Ambulatory Visit: Payer: Self-pay

## 2020-02-15 ENCOUNTER — Ambulatory Visit
Admission: EM | Admit: 2020-02-15 | Discharge: 2020-02-15 | Disposition: A | Discharge status: Home / Self Care | Payer: Federal, State, Local not specified - PPO | Attending: Family Medicine | Admitting: Family Medicine

## 2020-02-15 DIAGNOSIS — H66012 Acute suppurative otitis media with spontaneous rupture of ear drum, left ear: Secondary | ICD-10-CM | POA: Diagnosis not present

## 2020-02-15 DIAGNOSIS — H9202 Otalgia, left ear: Secondary | ICD-10-CM

## 2020-02-15 MED ORDER — AMOXICILLIN-POT CLAVULANATE 875-125 MG PO TABS: 1.0000 | ORAL_TABLET | Freq: Two times a day (BID) | ORAL | 0 refills | Status: AC

## 2020-02-15 NOTE — Discharge Instructions (Signed)
I have sent in Augmentin for you to take twice a day for 7 days for your ear infection  Follow up with this office or with primary care if symptoms are persisting  Follow up with the ER for high fever, trouble swallowing, trouble breathing, other concerning symptoms

## 2020-02-15 NOTE — ED Provider Notes (Signed)
[facility]   [id number] 02/15/20 Arrival Time: 1552  CC: EAR PAIN  SUBJECTIVE: History from: patient.  Anita Duncan is a 64 y.o. female who presents with left ear pain and drainage for the last day and a half.  Reports that this is a recurrent issue and that she is followed by ENT but cannot get in to be seen today. Patient states the pain is constant and achy in character. Patient has taken OTC medications for this without relief. Symptoms are made worse with lying down. Denies fever, chills, fatigue, sinus pain, rhinorrhea, sore throat, SOB, wheezing, chest pain, nausea, changes in bowel or bladder habits.    ROS: As per HPI.  All other pertinent ROS negative.     Past Medical History:  Diagnosis Date  . Hypothyroidism    Past Surgical History:  Procedure Laterality Date  . BREAST BIOPSY Right   . BREAST BIOPSY Right   . BREAST EXCISIONAL BIOPSY Right   . BREAST LUMPECTOMY    . CESAREAN SECTION     Allergies  Allergen Reactions  . Codeine Nausea Only  . Sulfa Antibiotics Nausea And Vomiting   No current facility-administered medications on file prior to encounter.   Current Outpatient Medications on File Prior to Encounter  Medication Sig Dispense Refill  . Chlorphen-PE-Acetaminophen (NOREL AD) 4-10-325 MG TABS TAKE ONE TABLET BY MOUTH TWICE A DAY 20 tablet 0  . ciprofloxacin-fluocinolone PF (OTOVEL) 0.3-0.025 % SOLN Place 0.25 mLs into the left ear 2 (two) times daily. 14 each 0  . fluticasone (FLONASE) 50 MCG/ACT nasal spray Place 1 spray into both nostrils daily. 16 g 2  . levocetirizine (XYZAL) 5 MG tablet TAKE 1 TABLET BY MOUTH EVERY DAY IN THE EVENING 90 tablet 1  . nabumetone (RELAFEN) 750 MG tablet Take 1 tablet (750 mg total) by mouth 2 (two) times daily as needed for mild pain or moderate pain. with food 60 tablet 3  . SYNTHROID 100 MCG tablet TAKE 1 TABLET FRIDAY       THROUGH SUNDAY 39 tablet 1  . SYNTHROID 88 MCG tablet TAKE 1 TABLET MONDAY        THROUGH THURSDAY 52 tablet 0   Social History   Socioeconomic History  . Marital status: Single    Spouse name: Not on file  . Number of children: Not on file  . Years of education: Not on file  . Highest education level: Not on file  Occupational History  . Not on file  Tobacco Use  . Smoking status: Current Every Day Smoker    Packs/day: 0.50    Years: 44.00    Pack years: 22.00    Types: Cigarettes  . Smokeless tobacco: Former Neurosurgeon  . Tobacco comment: Encouraged to cut back on number of cigs smoked  Vaping Use  . Vaping Use: Never used  Substance and Sexual Activity  . Alcohol use: Yes    Alcohol/week: 1.0 standard drink    Types: 1 Glasses of wine per week    Comment: 2 x a year  . Drug use: No  . Sexual activity: Not Currently  Other Topics Concern  . Not on file  Social History Narrative  . Not on file   Social Determinants of Health   Financial Resource Strain:   . Difficulty of Paying Living Expenses: Not on file  Food Insecurity:   . Worried About Programme researcher, broadcasting/film/video in the Last Year: Not on file  . Ran Out of  Food in the Last Year: Not on file  Transportation Needs:   . Lack of Transportation (Medical): Not on file  . Lack of Transportation (Non-Medical): Not on file  Physical Activity:   . Days of Exercise per Week: Not on file  . Minutes of Exercise per Session: Not on file  Stress:   . Feeling of Stress : Not on file  Social Connections:   . Frequency of Communication with Friends and Family: Not on file  . Frequency of Social Gatherings with Friends and Family: Not on file  . Attends Religious Services: Not on file  . Active Member of Clubs or Organizations: Not on file  . Attends Banker Meetings: Not on file  . Marital Status: Not on file  Intimate Partner Violence:   . Fear of Current or Ex-Partner: Not on file  . Emotionally Abused: Not on file  . Physically Abused: Not on file  . Sexually Abused: Not on file   Family  History  Problem Relation Age of Onset  . Breast cancer Cousin 31    OBJECTIVE:  Vitals:   02/15/20 1640  BP: 137/90  Pulse: 99  Resp: 14  Temp: 98.5 F (36.9 C)  SpO2: 97%     General appearance: alert; appears fatigued HEENT: Ears: Right EAC clear, left EAC with white and yellow fluid in the canal, appears to be coming from the other side of the TM.  TM is perforated on the left side Eyes: PERRL, EOMI grossly; Sinuses nontender to palpation; Nose: clear rhinorrhea; Throat: oropharynx mildly erythematous, tonsils 1+ without white tonsillar exudates, uvula midline Neck: supple without LAD Lungs: unlabored respirations, symmetrical air entry; cough: absent; no respiratory distress Heart: regular rate and rhythm.  Radial pulses 2+ symmetrical bilaterally Skin: warm and dry Psychological: alert and cooperative; normal mood and affect  Imaging: No results found.   ASSESSMENT & PLAN:  1. Non-recurrent acute suppurative otitis media of left ear with spontaneous rupture of tympanic membrane   2. Acute otalgia, left     Meds ordered this encounter  Medications  . amoxicillin-clavulanate (AUGMENTIN) 875-125 MG tablet    Sig: Take 1 tablet by mouth 2 (two) times daily for 7 days.    Dispense:  14 tablet    Refill:  0    Order Specific Question:   Supervising Provider    Answer:   Merrilee Jansky [[id number]]    Rest and drink plenty of fluids Prescribed augmentin 875 for 7 days Take medications as directed and to completion Continue to use OTC ibuprofen and/ or tylenol as needed for pain control Follow up with PCP if symptoms persists Return here or go to the ER if you have any new or worsening symptoms   Reviewed expectations re: course of current medical issues. Questions answered. Outlined signs and symptoms indicating need for more acute intervention. Patient verbalized understanding. After Visit Summary given.         Moshe Cipro, NP 02/15/20 1703

## 2020-02-15 NOTE — ED Triage Notes (Signed)
Patient reports she has left ear pain. States this is a recurrent issue and she is followed by ENT. Unable to get an appointment with ENT today.

## 2020-02-18 ENCOUNTER — Other Ambulatory Visit: Payer: Self-pay

## 2020-02-18 ENCOUNTER — Ambulatory Visit (INDEPENDENT_AMBULATORY_CARE_PROVIDER_SITE_OTHER): Payer: Federal, State, Local not specified - PPO | Admitting: Otolaryngology

## 2020-02-18 ENCOUNTER — Encounter: Payer: Self-pay | Admitting: Internal Medicine

## 2020-02-18 ENCOUNTER — Encounter (INDEPENDENT_AMBULATORY_CARE_PROVIDER_SITE_OTHER): Payer: Self-pay | Admitting: Otolaryngology

## 2020-02-18 VITALS — Temp 97.5°F

## 2020-02-18 DIAGNOSIS — H66012 Acute suppurative otitis media with spontaneous rupture of ear drum, left ear: Secondary | ICD-10-CM | POA: Diagnosis not present

## 2020-02-18 NOTE — Progress Notes (Signed)
HPI: Anita Duncan is a 64 y.o. female who returns today for evaluation of acute left otitis media with rupture.  She was seen at the ED and started on Augmentin which she has been on for 3 days.  She has been having some drainage from the left ear. I had seen her 3 months ago for wax buildup and right external otitis.  The right ear is doing well..  Past Medical History:  Diagnosis Date  . Hypothyroidism    Past Surgical History:  Procedure Laterality Date  . BREAST BIOPSY Right   . BREAST BIOPSY Right   . BREAST EXCISIONAL BIOPSY Right   . BREAST LUMPECTOMY    . CESAREAN SECTION     Social History   Socioeconomic History  . Marital status: Single    Spouse name: Not on file  . Number of children: Not on file  . Years of education: Not on file  . Highest education level: Not on file  Occupational History  . Not on file  Tobacco Use  . Smoking status: Current Every Day Smoker    Packs/day: 0.50    Years: 44.00    Pack years: 22.00    Types: Cigarettes  . Smokeless tobacco: Former Neurosurgeon  . Tobacco comment: Encouraged to cut back on number of cigs smoked  Vaping Use  . Vaping Use: Never used  Substance and Sexual Activity  . Alcohol use: Yes    Alcohol/week: 1.0 standard drink    Types: 1 Glasses of wine per week    Comment: 2 x a year  . Drug use: No  . Sexual activity: Not Currently  Other Topics Concern  . Not on file  Social History Narrative  . Not on file   Social Determinants of Health   Financial Resource Strain:   . Difficulty of Paying Living Expenses: Not on file  Food Insecurity:   . Worried About Programme researcher, broadcasting/film/video in the Last Year: Not on file  . Ran Out of Food in the Last Year: Not on file  Transportation Needs:   . Lack of Transportation (Medical): Not on file  . Lack of Transportation (Non-Medical): Not on file  Physical Activity:   . Days of Exercise per Week: Not on file  . Minutes of Exercise per Session: Not on file  Stress:   .  Feeling of Stress : Not on file  Social Connections:   . Frequency of Communication with Friends and Family: Not on file  . Frequency of Social Gatherings with Friends and Family: Not on file  . Attends Religious Services: Not on file  . Active Member of Clubs or Organizations: Not on file  . Attends Banker Meetings: Not on file  . Marital Status: Not on file   Family History  Problem Relation Age of Onset  . Breast cancer Cousin 31   Allergies  Allergen Reactions  . Codeine Nausea Only  . Sulfa Antibiotics Nausea And Vomiting   Prior to Admission medications   Medication Sig Start Date End Date Taking? Authorizing Provider  amoxicillin-clavulanate (AUGMENTIN) 875-125 MG tablet Take 1 tablet by mouth 2 (two) times daily for 7 days. 02/15/20 02/22/20 Yes Moshe Cipro, NP  Chlorphen-PE-Acetaminophen (NOREL AD) 4-10-325 MG TABS TAKE ONE TABLET BY MOUTH TWICE A DAY 02/26/19  Yes Rodriguez-Southworth, Nettie Elm, PA-C  ciprofloxacin-fluocinolone PF (OTOVEL) 0.3-0.025 % SOLN Place 0.25 mLs into the left ear 2 (two) times daily. 11/08/19  Yes Dorothyann Peng, MD  fluticasone (  FLONASE) 50 MCG/ACT nasal spray Place 1 spray into both nostrils daily. 11/12/19  Yes Dorothyann Peng, MD  levocetirizine (XYZAL) 5 MG tablet TAKE 1 TABLET BY MOUTH EVERY DAY IN THE EVENING 01/07/20  Yes Dorothyann Peng, MD  nabumetone (RELAFEN) 750 MG tablet Take 1 tablet (750 mg total) by mouth 2 (two) times daily as needed for mild pain or moderate pain. with food 02/11/20 03/12/20 Yes Nadara Mustard, MD  SYNTHROID 100 MCG tablet TAKE 1 TABLET FRIDAY       THROUGH SUNDAY 03/31/19  Yes Dorothyann Peng, MD  SYNTHROID 88 MCG tablet TAKE 1 TABLET MONDAY       THROUGH THURSDAY 12/05/19  Yes Dorothyann Peng, MD     Positive ROS: Otherwise negative  All other systems have been reviewed and were otherwise negative with the exception of those mentioned in the HPI and as above.  Physical Exam: Constitutional: Alert,  well-appearing, no acute distress Ears: External ears without lesions or tenderness.  She has purulent discharge in the left ear canal that was cleaned with a suction.  She has a large posterior perforation occupying 20 to 25%.  After cleaning the debris and drainage I applied Floxin and CSF powder to the left ear.  The right ear canal and right TM are clear. Nasal: External nose without lesions.. Clear nasal passages Oral: Lips and gums without lesions. Tongue and palate mucosa without lesions. Posterior oropharynx clear. Neck: No palpable adenopathy or masses Respiratory: Breathing comfortably  Skin: No facial/neck lesions or rash noted.  Binocular microscopy  Date/Time: 02/18/2020 5:34 PM Performed by: Drema Halon, MD Authorized by: Drema Halon, MD   Consent:    Consent obtained:  Verbal   Consent given by:  Patient   Alternatives discussed:  No treatment Procedure details:    Indications: otitis media     Scope location: bilateral     Right speculum size:  3 Cerumen:    Right ear: normal quantity of cerumen     Left ear: normal quantity of cerumen   Right ear canal:    normal   Right tympanic membrane:    Mobility: fully mobile   Left tympanic membrane:    Perforation: central perforation     Effusion: partial effusion     Effusion characteristics: mucoid effusion   Post-procedure details:    Patient tolerance of procedure:  Tolerated well, no immediate complications Comments:     Right TM was clear.  Left TM with a posterior TM perforation with drainage that was cleaned.  After cleaning the drainage I applied Floxin and CSF powder to the left ear.    Assessment: Acute left otitis media with TM rupture.  Plan: Place her on ciprofloxacin dexamethasone suspension eardrops 4 to 5 drops twice daily for 5 to 7 days or until drainage stops.  She will complete the Augmentin. I discussed with her that she has a perforation in the left TM and would keep  water out of the left ear for the next month at least   Narda Bonds, MD

## 2020-02-25 ENCOUNTER — Other Ambulatory Visit: Payer: Self-pay

## 2020-02-25 ENCOUNTER — Ambulatory Visit: Payer: Federal, State, Local not specified - PPO | Admitting: Cardiology

## 2020-02-25 ENCOUNTER — Encounter: Payer: Self-pay | Admitting: Cardiology

## 2020-02-25 VITALS — BP 140/90 | HR 70 | Ht 62.0 in | Wt 175.8 lb

## 2020-02-25 DIAGNOSIS — I2510 Atherosclerotic heart disease of native coronary artery without angina pectoris: Secondary | ICD-10-CM | POA: Diagnosis not present

## 2020-02-25 DIAGNOSIS — R011 Cardiac murmur, unspecified: Secondary | ICD-10-CM

## 2020-02-25 DIAGNOSIS — I2584 Coronary atherosclerosis due to calcified coronary lesion: Secondary | ICD-10-CM | POA: Diagnosis not present

## 2020-02-25 MED ORDER — ROSUVASTATIN CALCIUM 10 MG PO TABS: 10.0000 mg | ORAL_TABLET | Freq: Every day | ORAL | 3 refills | Status: DC

## 2020-02-25 NOTE — Patient Instructions (Signed)
Medication Instructions:  Your physician has recommended you make the following change in your medication:  1.) start rosuvastatin (Crestor) 10 mg daily  *If you need a refill on your cardiac medications before your next appointment, please call your pharmacy*  Lab Work: In 3 months --lipids, alt  If you have labs (blood work) drawn today and your tests are completely normal, you will receive your results only by: Marland Kitchen MyChart Message (if you have MyChart) OR . A paper copy in the mail If you have any lab test that is abnormal or we need to change your treatment, we will call you to review the results.   Testing/Procedures: Your physician has requested that you have an echocardiogram. Echocardiography is a painless test that uses sound waves to create images of your heart. It provides your doctor with information about the size and shape of your heart and how well your heart's chambers and valves are working. This procedure takes approximately one hour. There are no restrictions for this procedure.   Follow-Up: At [facility], you and your health needs are our priority.  As part of our continuing mission to provide you with exceptional heart care, we have created designated Provider Care Teams.  These Care Teams include your primary Cardiologist (physician) and Advanced Practice Providers (APPs -  Physician Assistants and Nurse Practitioners) who all work together to provide you with the care you need, when you need it.   Your next appointment:   6 month(s)  The format for your next appointment:   In Person  Provider:   You may see Donato Schultz, MD or one of the following Advanced Practice Providers on your designated Care Team:    Norma Fredrickson, NP  Nada Boozer, NP  Georgie Chard, NP    Other Instructions

## 2020-02-25 NOTE — Progress Notes (Signed)
Cardiology Office Note:    Date:  02/25/2020   ID:  Anita Duncan, DOB [birth year], MRN [id number]  PCP:  Anita Peng, MD  [facility] Cardiologist:  Anita Schultz, MD  [facility] Electrophysiologist:  None   Referring MD: Anita Peng, MD     History of Present Illness:    Anita Duncan is a 64 y.o. female here for the evaluation of family history of CAD at the request of Dr. Dorothyann Duncan.  Left ear pain she has been seeing ENT, amoxicillin, eardrops.  Urgent care note reviewed.  Father died at 13 with CAD, MI.  Has a sister at 68 with hole in back of heart.   No chest pain. Had mild murmurs. No syncope. No bleeding. Mild SOB. Wants to stop smoking. TOB use.   LDL 131  Hurt on job (post office), impingement syndrome, right shoulder, hand locks. Dr. Lajoyce Duncan, coconut water.   Cramps in calf, random, goes with right arm pain. Not with ambulation.  Great distal pulses  Grandmother CVA 83.   Mother AFIB-I take care of Anita Duncan her mother  She had a CT scan of her chest for cancer surveillance given her smoking history, I personally reviewed the scan and showed her the images.  My interpretation demonstrates LAD calcification focal, proximal right coronary artery calcification/aortic atherosclerosis in that region.  See below for details.    Past Medical History:  Diagnosis Date  . Hypothyroidism     Past Surgical History:  Procedure Laterality Date  . BREAST BIOPSY Right   . BREAST BIOPSY Right   . BREAST EXCISIONAL BIOPSY Right   . BREAST LUMPECTOMY    . CESAREAN SECTION      Current Medications: Current Meds  Medication Sig  . Chlorphen-PE-Acetaminophen (NOREL AD) 4-10-325 MG TABS TAKE ONE TABLET BY MOUTH TWICE A DAY  . ciprofloxacin-fluocinolone PF (OTOVEL) 0.3-0.025 % SOLN Place 0.25 mLs into the left ear 2 (two) times daily.  . fluticasone (FLONASE) 50 MCG/ACT nasal spray Place 1 spray into both nostrils daily.  Marland Kitchen levocetirizine (XYZAL) 5 MG  tablet TAKE 1 TABLET BY MOUTH EVERY DAY IN THE EVENING  . nabumetone (RELAFEN) 750 MG tablet Take 1 tablet (750 mg total) by mouth 2 (two) times daily as needed for mild pain or moderate pain. with food  . SYNTHROID 100 MCG tablet TAKE 1 TABLET FRIDAY       THROUGH SUNDAY  . SYNTHROID 88 MCG tablet TAKE 1 TABLET MONDAY       THROUGH THURSDAY     Allergies:   Codeine and Sulfa antibiotics   Social History   Socioeconomic History  . Marital status: Single    Spouse name: Not on file  . Number of children: Not on file  . Years of education: Not on file  . Highest education level: Not on file  Occupational History  . Not on file  Tobacco Use  . Smoking status: Current Every Day Smoker    Packs/day: 0.50    Years: 44.00    Pack years: 22.00    Types: Cigarettes  . Smokeless tobacco: Former Neurosurgeon  . Tobacco comment: Encouraged to cut back on number of cigs smoked  Vaping Use  . Vaping Use: Never used  Substance and Sexual Activity  . Alcohol use: Yes    Alcohol/week: 1.0 standard drink    Types: 1 Glasses of wine per week    Comment: 2 x a year  . Drug use: No  .  Sexual activity: Not Currently  Other Topics Concern  . Not on file  Social History Narrative  . Not on file   Social Determinants of Health   Financial Resource Strain:   . Difficulty of Paying Living Expenses: Not on file  Food Insecurity:   . Worried About Programme researcher, broadcasting/film/video in the Last Year: Not on file  . Ran Out of Food in the Last Year: Not on file  Transportation Needs:   . Lack of Transportation (Medical): Not on file  . Lack of Transportation (Non-Medical): Not on file  Physical Activity:   . Days of Exercise per Week: Not on file  . Minutes of Exercise per Session: Not on file  Stress:   . Feeling of Stress : Not on file  Social Connections:   . Frequency of Communication with Friends and Family: Not on file  . Frequency of Social Gatherings with Friends and Family: Not on file  . Attends  Religious Services: Not on file  . Active Member of Clubs or Organizations: Not on file  . Attends Banker Meetings: Not on file  . Marital Status: Not on file     Family History: The patient's family history includes Breast cancer (age of onset: 21) in her cousin.  ROS:   Please see the history of present illness.     All other systems reviewed and are negative.  EKGs/Labs/Other Studies Reviewed:    The following studies were reviewed today: Prior office note from Anita Duncan scan of the lungs.  Reviewed the CT scan personally with her.  Reviewed urgent care note most recently-ear pain.    EKG:  EKG is  ordered today.  The ekg ordered today demonstrates sinus rhythm 70  Recent Labs: 08/21/2019: BUN 13; Creatinine, Ser 0.78; Potassium 4.2; Sodium 141; TSH 2.800  Recent Lipid Panel    Component Value Date/Time   CHOL 205 (H) 02/20/2019 1545   TRIG 41 02/20/2019 1545   HDL 66 02/20/2019 1545   CHOLHDL 3.1 02/20/2019 1545   LDLCALC 131 (H) 02/20/2019 1545     Risk Assessment/Calculations:       Physical Exam:    VS:  BP 140/90   Pulse 70   Ht 5\' 2"  (1.575 m)   Wt 175 lb 12.8 oz (79.7 kg)   BMI 32.15 kg/m     Wt Readings from Last 3 Encounters:  02/25/20 175 lb 12.8 oz (79.7 kg)  11/08/19 177 lb 12.8 oz (80.6 kg)  10/25/19 169 lb (76.7 kg)     GEN:  Well nourished, well developed in no acute distress HEENT: Normal NECK: No JVD; No carotid bruits LYMPHATICS: No lymphadenopathy CARDIAC: RRR, soft systolic murmur, no rubs, gallops RESPIRATORY:  Clear to auscultation without rales, wheezing or rhonchi  ABDOMEN: Soft, non-tender, non-distended MUSCULOSKELETAL:  No edema; No deformity  SKIN: Warm and dry NEUROLOGIC:  Alert and oriented x 3 PSYCHIATRIC:  Normal affect   ASSESSMENT:    1. Coronary artery calcification   2. Murmur    PLAN:    In order of problems listed above:  Coronary artery atherosclerosis/coronary artery  calcification/family history of CAD -LAD calcification noted, aortic atherosclerosis adjacent to ostial RCA also noted. -This was personally reviewed from CT scan of lungs recently. -Demonstrated to her the increased risk involved with coronary atherosclerosis especially given her family history of coronary artery disease with her father dying at age 26 from MI. -Discussed the importance of diet, exercise.  Her son  is a Systems analyst for instance. -I also discussed the importance of utilizing rosuvastatin which we will go ahead and start at 10 mg once a day.  We will repeat lipid panel and ALT in 3 months.  After that point if she is tolerating well, we will likely advance Crestor to 20 mg a day for high intensity statin dose.  Discussed potential side effects.  She does occasionally have leg cramps arm cramps.  Stated that this medication should not make these worse.  Of course if she notices large muscle discomfort or weakness she will let me know.  Crestor will ultimately help reduce her overall risk of MI. -No need to do calcium score since CT scan shows calcification of arteries.  Soft heart murmur -Has been told about this in the past.  Very soft.  Mild shortness of breath.  Likely smoking related.  We will go ahead and check an echocardiogram to ensure proper structure and function of her heart.  Aortic atherosclerosis -Starting Crestor  Tobacco use -Continue to encourage tobacco cessation.  She has cut back.  She is wanting to stop.  39-month follow-up.   Medication Adjustments/Labs and Tests Ordered: Current medicines are reviewed at length with the patient today.  Concerns regarding medicines are outlined above.  Orders Placed This Encounter  Procedures  . ALT  . Lipid panel  . EKG 12-Lead  . ECHOCARDIOGRAM COMPLETE   Meds ordered this encounter  Medications  . rosuvastatin (CRESTOR) 10 MG tablet    Sig: Take 1 tablet (10 mg total) by mouth daily.    Dispense:  90 tablet     Refill:  3    Patient Instructions  Medication Instructions:  Your physician has recommended you make the following change in your medication:  1.) start rosuvastatin (Crestor) 10 mg daily  *If you need a refill on your cardiac medications before your next appointment, please call your pharmacy*  Lab Work: In 3 months --lipids, alt  If you have labs (blood work) drawn today and your tests are completely normal, you will receive your results only by: Marland Kitchen MyChart Message (if you have MyChart) OR . A paper copy in the mail If you have any lab test that is abnormal or we need to change your treatment, we will call you to review the results.   Testing/Procedures: Your physician has requested that you have an echocardiogram. Echocardiography is a painless test that uses sound waves to create images of your heart. It provides your doctor with information about the size and shape of your heart and how well your heart's chambers and valves are working. This procedure takes approximately one hour. There are no restrictions for this procedure.   Follow-Up: At [facility], you and your health needs are our priority.  As part of our continuing mission to provide you with exceptional heart care, we have created designated Provider Care Teams.  These Care Teams include your primary Cardiologist (physician) and Advanced Practice Providers (APPs -  Physician Assistants and Nurse Practitioners) who all work together to provide you with the care you need, when you need it.   Your next appointment:   6 month(s)  The format for your next appointment:   In Person  Provider:   You may see Anita Schultz, MD or one of the following Advanced Practice Providers on your designated Care Team:    Norma Fredrickson, NP  Nada Boozer, NP  Georgie Chard, NP    Other Instructions  Signed, Anita SchultzMark Arkeem Harts, MD  02/25/2020 10:39 AM    [facility]

## 2020-02-26 ENCOUNTER — Encounter: Payer: Self-pay | Admitting: Internal Medicine

## 2020-02-26 ENCOUNTER — Ambulatory Visit: Payer: Federal, State, Local not specified - PPO | Admitting: Internal Medicine

## 2020-02-26 VITALS — BP 130/80 | HR 78 | Temp 98.2°F | Ht 62.8 in | Wt 177.2 lb

## 2020-02-26 DIAGNOSIS — Z0000 Encounter for general adult medical examination without abnormal findings: Secondary | ICD-10-CM

## 2020-02-26 DIAGNOSIS — E6609 Other obesity due to excess calories: Secondary | ICD-10-CM

## 2020-02-26 DIAGNOSIS — I700 Atherosclerosis of aorta: Secondary | ICD-10-CM | POA: Diagnosis not present

## 2020-02-26 DIAGNOSIS — E559 Vitamin D deficiency, unspecified: Secondary | ICD-10-CM

## 2020-02-26 DIAGNOSIS — E039 Hypothyroidism, unspecified: Secondary | ICD-10-CM | POA: Diagnosis not present

## 2020-02-26 DIAGNOSIS — Z6832 Body mass index (BMI) 32.0-32.9, adult: Secondary | ICD-10-CM

## 2020-02-26 DIAGNOSIS — R7309 Other abnormal glucose: Secondary | ICD-10-CM | POA: Diagnosis not present

## 2020-02-26 NOTE — Patient Instructions (Signed)
Health Maintenance, Female Adopting a healthy lifestyle and getting preventive care are important in promoting health and wellness. Ask your health care provider about:  The right schedule for you to have regular tests and exams.  Things you can do on your own to prevent diseases and keep yourself healthy. What should I know about diet, weight, and exercise? Eat a healthy diet   Eat a diet that includes plenty of vegetables, fruits, low-fat dairy products, and lean protein.  Do not eat a lot of foods that are high in solid fats, added sugars, or sodium. Maintain a healthy weight Body mass index (BMI) is used to identify weight problems. It estimates body fat based on height and weight. Your health care provider can help determine your BMI and help you achieve or maintain a healthy weight. Get regular exercise Get regular exercise. This is one of the most important things you can do for your health. Most adults should:  Exercise for at least 150 minutes each week. The exercise should increase your heart rate and make you sweat (moderate-intensity exercise).  Do strengthening exercises at least twice a week. This is in addition to the moderate-intensity exercise.  Spend less time sitting. Even light physical activity can be beneficial. Watch cholesterol and blood lipids Have your blood tested for lipids and cholesterol at 64 years of age, then have this test every 5 years. Have your cholesterol levels checked more often if:  Your lipid or cholesterol levels are high.  You are older than 64 years of age.  You are at high risk for heart disease. What should I know about cancer screening? Depending on your health history and family history, you may need to have cancer screening at various ages. This may include screening for:  Breast cancer.  Cervical cancer.  Colorectal cancer.  Skin cancer.  Lung cancer. What should I know about heart disease, diabetes, and high blood  pressure? Blood pressure and heart disease  High blood pressure causes heart disease and increases the risk of stroke. This is more likely to develop in people who have high blood pressure readings, are of African descent, or are overweight.  Have your blood pressure checked: ? Every 3-5 years if you are 18-39 years of age. ? Every year if you are 40 years old or older. Diabetes Have regular diabetes screenings. This checks your fasting blood sugar level. Have the screening done:  Once every three years after age 40 if you are at a normal weight and have a low risk for diabetes.  More often and at a younger age if you are overweight or have a high risk for diabetes. What should I know about preventing infection? Hepatitis B If you have a higher risk for hepatitis B, you should be screened for this virus. Talk with your health care provider to find out if you are at risk for hepatitis B infection. Hepatitis C Testing is recommended for:  Everyone born from 1945 through 1965.  Anyone with known risk factors for hepatitis C. Sexually transmitted infections (STIs)  Get screened for STIs, including gonorrhea and chlamydia, if: ? You are sexually active and are younger than 64 years of age. ? You are older than 64 years of age and your health care provider tells you that you are at risk for this type of infection. ? Your sexual activity has changed since you were last screened, and you are at increased risk for chlamydia or gonorrhea. Ask your health care provider if   you are at risk.  Ask your health care provider about whether you are at high risk for HIV. Your health care provider may recommend a prescription medicine to help prevent HIV infection. If you choose to take medicine to prevent HIV, you should first get tested for HIV. You should then be tested every 3 months for as long as you are taking the medicine. Pregnancy  If you are about to stop having your period (premenopausal) and  you may become pregnant, seek counseling before you get pregnant.  Take 400 to 800 micrograms (mcg) of folic acid every day if you become pregnant.  Ask for birth control (contraception) if you want to prevent pregnancy. Osteoporosis and menopause Osteoporosis is a disease in which the bones lose minerals and strength with aging. This can result in bone fractures. If you are 65 years old or older, or if you are at risk for osteoporosis and fractures, ask your health care provider if you should:  Be screened for bone loss.  Take a calcium or vitamin D supplement to lower your risk of fractures.  Be given hormone replacement therapy (HRT) to treat symptoms of menopause. Follow these instructions at home: Lifestyle  Do not use any products that contain nicotine or tobacco, such as cigarettes, e-cigarettes, and chewing tobacco. If you need help quitting, ask your health care provider.  Do not use street drugs.  Do not share needles.  Ask your health care provider for help if you need support or information about quitting drugs. Alcohol use  Do not drink alcohol if: ? Your health care provider tells you not to drink. ? You are pregnant, may be pregnant, or are planning to become pregnant.  If you drink alcohol: ? Limit how much you use to 0-1 drink a day. ? Limit intake if you are breastfeeding.  Be aware of how much alcohol is in your drink. In the [place] one drink equals one 12 oz bottle of beer (355 mL), one 5 oz glass of wine (148 mL), or one 1 oz glass of hard liquor (44 mL). General instructions  Schedule regular health, dental, and eye exams.  Stay current with your vaccines.  Tell your health care provider if: ? You often feel depressed. ? You have ever been abused or do not feel safe at home. Summary  Adopting a healthy lifestyle and getting preventive care are important in promoting health and wellness.  Follow your health care provider's instructions about healthy  diet, exercising, and getting tested or screened for diseases.  Follow your health care provider's instructions on monitoring your cholesterol and blood pressure. This information is not intended to replace advice given to you by your health care provider. Make sure you discuss any questions you have with your health care provider. Document Revised: 04/26/2018 Document Reviewed: 04/26/2018 Elsevier Patient Education  2020 Elsevier Inc.  

## 2020-02-26 NOTE — Progress Notes (Signed)
I,Tianna Badgett,acting as a Education administrator for Maximino Greenland, MD.,have documented all relevant documentation on the behalf of Maximino Greenland, MD,as directed by  Maximino Greenland, MD while in the presence of Maximino Greenland, MD.  This visit occurred during the SARS-CoV-2 public health emergency.  Safety protocols were in place, including screening questions prior to the visit, additional usage of staff PPE, and extensive cleaning of exam room while observing appropriate contact time as indicated for disinfecting solutions.  Subjective:     Patient ID: Anita Duncan , female    DOB: July 07, 1955 , 64 y.o.   MRN: [id number]   Chief Complaint  Patient presents with  . Annual Exam  . Hypothyroidism    HPI  She is here today for a full physical examination. She is followed by Dr. Ronita Hipps for her GYN exams. She reports she is up to date with her GYN exams. She reports she was last seen in the past month or two.  Thyroid Problem Presents for follow-up visit. Patient reports no cold intolerance, constipation, depressed mood, menstrual problem, palpitations or tremors.     Past Medical History:  Diagnosis Date  . Hypothyroidism      Family History  Problem Relation Age of Onset  . Breast cancer Cousin 31     Current Outpatient Medications:  .  Chlorphen-PE-Acetaminophen (NOREL AD) 4-10-325 MG TABS, TAKE ONE TABLET BY MOUTH TWICE A DAY, Disp: 20 tablet, Rfl: 0 .  ciprofloxacin-fluocinolone PF (OTOVEL) 0.3-0.025 % SOLN, Place 0.25 mLs into the left ear 2 (two) times daily., Disp: 14 each, Rfl: 0 .  fluticasone (FLONASE) 50 MCG/ACT nasal spray, Place 1 spray into both nostrils daily., Disp: 16 g, Rfl: 2 .  levocetirizine (XYZAL) 5 MG tablet, TAKE 1 TABLET BY MOUTH EVERY DAY IN THE EVENING, Disp: 90 tablet, Rfl: 1 .  nabumetone (RELAFEN) 750 MG tablet, Take 1 tablet (750 mg total) by mouth 2 (two) times daily as needed for mild pain or moderate pain. with food, Disp: 60 tablet, Rfl: 3 .   rosuvastatin (CRESTOR) 10 MG tablet, Take 1 tablet (10 mg total) by mouth daily., Disp: 90 tablet, Rfl: 3 .  SYNTHROID 100 MCG tablet, TAKE 1 TABLET FRIDAY       THROUGH SUNDAY, Disp: 39 tablet, Rfl: 1 .  SYNTHROID 88 MCG tablet, TAKE 1 TABLET MONDAY       THROUGH THURSDAY, Disp: 52 tablet, Rfl: 0   Allergies  Allergen Reactions  . Codeine Nausea Only  . Sulfa Antibiotics Nausea And Vomiting      The patient states she uses post menopausal status for birth control. Last LMP was No LMP recorded. Patient is postmenopausal.. Negative for Dysmenorrhea. Negative for: breast discharge, breast lump(s), breast pain and breast self exam. Associated symptoms include abnormal vaginal bleeding. Pertinent negatives include abnormal bleeding (hematology), anxiety, decreased libido, depression, difficulty falling sleep, dyspareunia, history of infertility, nocturia, sexual dysfunction, sleep disturbances, urinary incontinence, urinary urgency, vaginal discharge and vaginal itching. Diet regular.The patient states her exercise level is  intermittent.   . The patient's tobacco use is:  Social History   Tobacco Use  Smoking Status Current Every Day Smoker  . Packs/day: 0.50  . Years: 44.00  . Pack years: 22.00  . Types: Cigarettes  Smokeless Tobacco Former Systems developer  Tobacco Comment   Encouraged to cut back on number of cigs smoked  . She has been exposed to passive smoke. The patient's alcohol use is:  Social History  Substance and Sexual Activity  Alcohol Use Yes  . Alcohol/week: 1.0 standard drink  . Types: 1 Glasses of wine per week   Comment: 2 x a year    Review of Systems  Constitutional: Negative.   HENT: Negative.   Eyes: Negative.   Respiratory: Negative.   Cardiovascular: Negative.  Negative for palpitations.  Gastrointestinal: Negative.  Negative for constipation.  Endocrine: Negative.  Negative for cold intolerance.  Genitourinary: Negative.  Negative for menstrual problem.   Musculoskeletal: Negative.   Skin: Negative.   Allergic/Immunologic: Negative.   Neurological: Negative.  Negative for tremors.  Hematological: Negative.   Psychiatric/Behavioral: Negative.      Today's Vitals   02/26/20 1415  BP: 130/80  Pulse: 78  Temp: 98.2 F (36.8 C)  TempSrc: Oral  Weight: 177 lb 3.2 oz (80.4 kg)  Height: 5' 2.8" (1.595 m)   Body mass index is 31.59 kg/m.   Objective:  Physical Exam Vitals and nursing note reviewed.  Constitutional:      Appearance: Normal appearance. She is obese.     Comments: She has on bra/jeans.   HENT:     Head: Normocephalic and atraumatic.     Right Ear: Tympanic membrane, ear canal and external ear normal.     Left Ear: Tympanic membrane, ear canal and external ear normal.     Nose:     Comments: Deferred, masked    Mouth/Throat:     Comments: Deferred, masked Eyes:     Extraocular Movements: Extraocular movements intact.     Conjunctiva/sclera: Conjunctivae normal.     Pupils: Pupils are equal, round, and reactive to light.  Cardiovascular:     Rate and Rhythm: Normal rate and regular rhythm.     Pulses: Normal pulses.     Heart sounds: Normal heart sounds.  Pulmonary:     Effort: Pulmonary effort is normal.     Breath sounds: Normal breath sounds.  Chest:     Breasts: Tanner Score is 5.     Comments: She kept bra on.  Abdominal:     General: Bowel sounds are normal.     Palpations: Abdomen is soft.  Genitourinary:    Comments: deferred Musculoskeletal:        General: Normal range of motion.     Cervical back: Normal range of motion and neck supple.  Skin:    General: Skin is warm and dry.  Neurological:     General: No focal deficit present.     Mental Status: She is alert and oriented to person, place, and time.  Psychiatric:        Mood and Affect: Mood normal.        Behavior: Behavior normal.         Assessment And Plan:     1. Encounter for annual physical exam Comments: A full exam was  performed. Importance of monthly self breast exams was discussed with the patient.  PATIENT IS ADVISED TO GET 30-45 MINUTES REGULAR EXERCISE NO LESS THAN FOUR TO FIVE DAYS PER WEEK - BOTH WEIGHTBEARING EXERCISES AND AEROBIC ARE RECOMMENDED.  PATIENT IS ADVISED TO FOLLOW A HEALTHY DIET WITH AT LEAST SIX FRUITS/VEGGIES PER DAY, DECREASE INTAKE OF RED MEAT, AND TO INCREASE FISH INTAKE TO TWO DAYS PER WEEK.  MEATS/FISH SHOULD NOT BE FRIED, BAKED OR BROILED IS PREFERABLE.  I SUGGEST WEARING SPF 50 SUNSCREEN ON EXPOSED PARTS AND ESPECIALLY WHEN IN THE DIRECT SUNLIGHT FOR AN EXTENDED PERIOD OF TIME.  PLEASE AVOID  FAST FOOD RESTAURANTS AND INCREASE YOUR WATER INTAKE.  - CBC  - Hemoglobin A1c - CMP14+EGFR  2. Primary hypothyroidism Comments: I will check thyroid panel and adjust meds as needed. She will rto in six months for re-evaluation.  - TSH - T4, Free  3. Other abnormal glucose Comments: Her a1c has been elevated in the past, I will recheck this today. She is encouraged to limit her intake of sugary beverages.   4. Vitamin D deficiency Comments: I will check vitamin D level and supplement as needed.  - VITAMIN D 25 Hydroxy (Vit-D Deficiency, Fractures)  5. Aortic atherosclerosis ([place]) Comments: Recent Cardiology note was reviewed, she was encouraged to take rosuvastatin as prescribed. Advised to follow heart healthy lifestyle.   6. Class 1 obesity due to excess calories with serious comorbidity and body mass index (BMI) of 32.0 to 32.9 in adult Comments: She is encouraged to strive for BMI less than 30 to decrease cardiac risk. Importance of regular exercise was discussed with the patient.   Wt Readings from Last 3 Encounters:  02/26/20 177 lb 3.2 oz (80.4 kg)  02/25/20 175 lb 12.8 oz (79.7 kg)  11/08/19 177 lb 12.8 oz (80.6 kg)       Patient was given opportunity to ask questions. Patient verbalized understanding of the plan and was able to repeat key elements of the plan. All  questions were answered to their satisfaction.   Maximino Greenland, MD   I, Maximino Greenland, MD, have reviewed all documentation for this visit. The documentation on 02/26/20 for the exam, diagnosis, procedures, and orders are all accurate and complete.  THE PATIENT IS ENCOURAGED TO PRACTICE SOCIAL DISTANCING DUE TO THE COVID-19 PANDEMIC.

## 2020-02-27 ENCOUNTER — Encounter: Payer: Self-pay | Admitting: Internal Medicine

## 2020-02-27 LAB — CMP14+EGFR
ALT: 44 IU/L — ABNORMAL HIGH (ref 0–32)
AST: 35 IU/L (ref 0–40)
Albumin/Globulin Ratio: 1.9 (ref 1.2–2.2)
Albumin: 4.6 g/dL (ref 3.8–4.8)
Alkaline Phosphatase: 111 IU/L (ref 44–121)
BUN/Creatinine Ratio: 14 (ref 12–28)
BUN: 12 mg/dL (ref 8–27)
Bilirubin Total: 0.4 mg/dL (ref 0.0–1.2)
CO2: 25 mmol/L (ref 20–29)
Calcium: 9.3 mg/dL (ref 8.7–10.3)
Chloride: 104 mmol/L (ref 96–106)
Creatinine, Ser: 0.87 mg/dL (ref 0.57–1.00)
GFR calc Af Amer: 82 mL/min/{1.73_m2} (ref >59)
GFR calc non Af Amer: 71 mL/min/{1.73_m2} (ref >59)
Globulin, Total: 2.4 g/dL (ref 1.5–4.5)
Glucose: 85 mg/dL (ref 65–99)
Potassium: 4.5 mmol/L (ref 3.5–5.2)
Sodium: 141 mmol/L (ref 134–144)
Total Protein: 7 g/dL (ref 6.0–8.5)

## 2020-02-27 LAB — CBC
Hematocrit: 38.1 % (ref 34.0–46.6)
Hemoglobin: 12.6 g/dL (ref 11.1–15.9)
MCH: 29.4 pg (ref 26.6–33.0)
MCHC: 33.1 g/dL (ref 31.5–35.7)
MCV: 89 fL (ref 79–97)
Platelets: 266 10*3/uL (ref 150–450)
RBC: 4.29 x10E6/uL (ref 3.77–5.28)
RDW: 13.1 % (ref 11.7–15.4)
WBC: 7.6 10*3/uL (ref 3.4–10.8)

## 2020-02-27 LAB — T4, FREE: Free T4: 1.28 ng/dL (ref 0.82–1.77)

## 2020-02-27 LAB — HEMOGLOBIN A1C
Est. average glucose Bld gHb Est-mCnc: 128 mg/dL
Hgb A1c MFr Bld: 6.1 % — ABNORMAL HIGH (ref 4.8–5.6)

## 2020-02-27 LAB — VITAMIN D 25 HYDROXY (VIT D DEFICIENCY, FRACTURES): Vit D, 25-Hydroxy: 18 ng/mL — ABNORMAL LOW (ref 30.0–100.0)

## 2020-02-27 LAB — TSH: TSH: 3.43 u[IU]/mL (ref 0.450–4.500)

## 2020-02-28 ENCOUNTER — Other Ambulatory Visit: Payer: Self-pay

## 2020-02-28 MED ORDER — VITAMIN D (ERGOCALCIFEROL) 1.25 MG (50000 UNIT) PO CAPS: ORAL_CAPSULE | ORAL | 0 refills | Status: DC

## 2020-03-06 ENCOUNTER — Ambulatory Visit (INDEPENDENT_AMBULATORY_CARE_PROVIDER_SITE_OTHER): Payer: Federal, State, Local not specified - PPO | Admitting: Otolaryngology

## 2020-03-06 ENCOUNTER — Other Ambulatory Visit: Payer: Self-pay

## 2020-03-06 DIAGNOSIS — H66012 Acute suppurative otitis media with spontaneous rupture of ear drum, left ear: Secondary | ICD-10-CM

## 2020-03-06 NOTE — Progress Notes (Signed)
HPI: Anita Duncan is a 64 y.o. female who returns today for evaluation of left ear infection with perforation.  She is doing much better and has stopped the antibiotic eardrops as well as completing the Augmentin.  She still notices occasional drainage in the mornings from her left ear but overall the ears feeling much better.  She is having no problems with the right side..  Past Medical History:  Diagnosis Date  . Hypothyroidism    Past Surgical History:  Procedure Laterality Date  . BREAST BIOPSY Right   . BREAST BIOPSY Right   . BREAST EXCISIONAL BIOPSY Right   . BREAST LUMPECTOMY    . CESAREAN SECTION     Social History   Socioeconomic History  . Marital status: Single    Spouse name: Not on file  . Number of children: Not on file  . Years of education: Not on file  . Highest education level: Not on file  Occupational History  . Not on file  Tobacco Use  . Smoking status: Current Every Day Smoker    Packs/day: 0.50    Years: 44.00    Pack years: 22.00    Types: Cigarettes  . Smokeless tobacco: Former Neurosurgeon  . Tobacco comment: Encouraged to cut back on number of cigs smoked  Vaping Use  . Vaping Use: Never used  Substance and Sexual Activity  . Alcohol use: Yes    Alcohol/week: 1.0 standard drink    Types: 1 Glasses of wine per week    Comment: 2 x a year  . Drug use: No  . Sexual activity: Not Currently  Other Topics Concern  . Not on file  Social History Narrative  . Not on file   Social Determinants of Health   Financial Resource Strain:   . Difficulty of Paying Living Expenses: Not on file  Food Insecurity:   . Worried About Programme researcher, broadcasting/film/video in the Last Year: Not on file  . Ran Out of Food in the Last Year: Not on file  Transportation Needs:   . Lack of Transportation (Medical): Not on file  . Lack of Transportation (Non-Medical): Not on file  Physical Activity:   . Days of Exercise per Week: Not on file  . Minutes of Exercise per Session: Not  on file  Stress:   . Feeling of Stress : Not on file  Social Connections:   . Frequency of Communication with Friends and Family: Not on file  . Frequency of Social Gatherings with Friends and Family: Not on file  . Attends Religious Services: Not on file  . Active Member of Clubs or Organizations: Not on file  . Attends Banker Meetings: Not on file  . Marital Status: Not on file   Family History  Problem Relation Age of Onset  . Breast cancer Cousin 31   Allergies  Allergen Reactions  . Codeine Nausea Only  . Sulfa Antibiotics Nausea And Vomiting   Prior to Admission medications   Medication Sig Start Date End Date Taking? Authorizing Provider  Chlorphen-PE-Acetaminophen (NOREL AD) 4-10-325 MG TABS TAKE ONE TABLET BY MOUTH TWICE A DAY 02/26/19   Rodriguez-Southworth, Nettie Elm, PA-C  ciprofloxacin-fluocinolone PF (OTOVEL) 0.3-0.025 % SOLN Place 0.25 mLs into the left ear 2 (two) times daily. 11/08/19   Dorothyann Peng, MD  fluticasone [facility]) 50 MCG/ACT nasal spray Place 1 spray into both nostrils daily. 11/12/19   Dorothyann Peng, MD  levocetirizine (XYZAL) 5 MG tablet TAKE 1 TABLET BY  MOUTH EVERY DAY IN THE EVENING 01/07/20   Dorothyann Peng, MD  nabumetone (RELAFEN) 750 MG tablet Take 1 tablet (750 mg total) by mouth 2 (two) times daily as needed for mild pain or moderate pain. with food 02/11/20 03/12/20  Nadara Mustard, MD  rosuvastatin (CRESTOR) 10 MG tablet Take 1 tablet (10 mg total) by mouth daily. 02/25/20   Jake Bathe, MD  SYNTHROID 100 MCG tablet TAKE 1 TABLET FRIDAY       THROUGH SUNDAY 03/31/19   Dorothyann Peng, MD  SYNTHROID 88 MCG tablet TAKE 1 TABLET MONDAY       THROUGH THURSDAY 12/05/19   Dorothyann Peng, MD  Vitamin D, Ergocalciferol, (DRISDOL) 1.25 MG (50000 UNIT) CAPS capsule Take 1 capsule by mouth on Tuesdays and Fridays 02/28/20   Dorothyann Peng, MD     Positive ROS: Otherwise negative  All other systems have been reviewed and were otherwise  negative with the exception of those mentioned in the HPI and as above.  Physical Exam: Constitutional: Alert, well-appearing, no acute distress Ears: External ears without lesions or tenderness.  Right ear canal and right TM are clear.  Left TM still has a central TM perforation with drainage from the perforation.  This was cleaned with suction and applied Ciprodex eardrops were were insufflated into the middle ear space. Nasal: External nose without lesions. . Clear nasal passages Oral: Lips and gums without lesions. Tongue and palate mucosa without lesions. Posterior oropharynx clear. Neck: No palpable adenopathy or masses Respiratory: Breathing comfortably  Skin: No facial/neck lesions or rash noted.  Binocular microscopy  Date/Time: 03/06/2020 4:55 PM Performed by: Drema Halon, MD Authorized by: Drema Halon, MD   Consent:    Consent obtained:  Verbal   Consent given by:  Patient   Alternatives discussed:  No treatment Procedure details:    Indications: otitis media and examination of tympanic membrane     Scope location: bilateral     Right speculum size:  3 Cerumen:    Right ear: normal quantity of cerumen     Left ear: normal quantity of cerumen   Right ear canal:    normal   Right tympanic membrane:    Mobility: fully mobile   Left tympanic membrane:    Perforation: central perforation     Effusion: partial effusion   Post-procedure details:    Patient tolerance of procedure:  Tolerated well, no immediate complications Comments:     On microscopic exam patient has a central left TM perforation with purulent discharge.  I insufflated some Ciprodex drops after suctioning and cleaning the ear canal.    Assessment: Still with draining central left TM perforation.  Plan: Recommended using the Ciprodex eardrops 4 drops twice daily for another 5 days to a week or until the drainage completely subsides.  Cautioned her about getting any water in the  ear for the next month. She will follow-up as needed if she has any persistent drainage or discomfort in the ear Also recommended use of the nasal steroid spray regularly.   Narda Bonds, MD

## 2020-03-07 ENCOUNTER — Ambulatory Visit
Admission: RE | Admit: 2020-03-07 | Discharge: 2020-03-07 | Disposition: A | Discharge status: Home / Self Care | Payer: Federal, State, Local not specified - PPO | Source: Ambulatory Visit | Attending: Obstetrics and Gynecology | Admitting: Obstetrics and Gynecology

## 2020-03-07 ENCOUNTER — Other Ambulatory Visit: Payer: Self-pay | Admitting: Internal Medicine

## 2020-03-07 ENCOUNTER — Other Ambulatory Visit: Payer: Self-pay

## 2020-03-07 DIAGNOSIS — Z780 Asymptomatic menopausal state: Secondary | ICD-10-CM | POA: Diagnosis not present

## 2020-03-07 DIAGNOSIS — Z13820 Encounter for screening for osteoporosis: Secondary | ICD-10-CM | POA: Diagnosis not present

## 2020-03-07 DIAGNOSIS — E2839 Other primary ovarian failure: Secondary | ICD-10-CM

## 2020-03-15 ENCOUNTER — Ambulatory Visit: Payer: Federal, State, Local not specified - PPO | Attending: Internal Medicine

## 2020-03-15 DIAGNOSIS — Z23 Encounter for immunization: Secondary | ICD-10-CM

## 2020-03-15 NOTE — Progress Notes (Signed)
   Covid-19 Vaccination Clinic  Name:  Anita Duncan    MRN: [id number] DOB: 09-22-1955  03/15/2020  Anita Duncan was observed post Covid-19 immunization for 15 minutes without incident. She was provided with Vaccine Information Sheet and instruction to access the V-Safe system.   Anita Duncan was instructed to call 911 with any severe reactions post vaccine: Marland Kitchen Difficulty breathing  . Swelling of face and throat  . A fast heartbeat  . A bad rash all over body  . Dizziness and weakness

## 2020-03-17 ENCOUNTER — Ambulatory Visit (HOSPITAL_COMMUNITY): Payer: Federal, State, Local not specified - PPO | Attending: Cardiology

## 2020-03-17 ENCOUNTER — Other Ambulatory Visit: Payer: Self-pay

## 2020-03-17 DIAGNOSIS — R011 Cardiac murmur, unspecified: Secondary | ICD-10-CM | POA: Insufficient documentation

## 2020-03-17 DIAGNOSIS — I2584 Coronary atherosclerosis due to calcified coronary lesion: Secondary | ICD-10-CM

## 2020-03-17 DIAGNOSIS — I2510 Atherosclerotic heart disease of native coronary artery without angina pectoris: Secondary | ICD-10-CM | POA: Insufficient documentation

## 2020-03-17 LAB — ECHOCARDIOGRAM COMPLETE
Area-P 1/2: 3.58 cm2
S' Lateral: 2.5 cm

## 2020-04-12 ENCOUNTER — Other Ambulatory Visit: Payer: Self-pay | Admitting: Internal Medicine

## 2020-04-18 ENCOUNTER — Other Ambulatory Visit (INDEPENDENT_AMBULATORY_CARE_PROVIDER_SITE_OTHER): Payer: Self-pay

## 2020-04-18 ENCOUNTER — Telehealth (INDEPENDENT_AMBULATORY_CARE_PROVIDER_SITE_OTHER): Payer: Self-pay

## 2020-04-18 MED ORDER — CIPROFLOXACIN-DEXAMETHASONE 0.3-0.1 % OT SUSP: 4.0000 [drp] | Freq: Two times a day (BID) | OTIC | 0 refills | Status: DC

## 2020-05-05 ENCOUNTER — Other Ambulatory Visit: Payer: Self-pay | Admitting: Internal Medicine

## 2020-05-26 ENCOUNTER — Other Ambulatory Visit: Payer: Self-pay

## 2020-05-26 ENCOUNTER — Other Ambulatory Visit: Payer: Federal, State, Local not specified - PPO | Admitting: *Deleted

## 2020-05-26 DIAGNOSIS — R011 Cardiac murmur, unspecified: Secondary | ICD-10-CM

## 2020-05-26 DIAGNOSIS — I2510 Atherosclerotic heart disease of native coronary artery without angina pectoris: Secondary | ICD-10-CM | POA: Diagnosis not present

## 2020-05-26 DIAGNOSIS — I2584 Coronary atherosclerosis due to calcified coronary lesion: Secondary | ICD-10-CM | POA: Diagnosis not present

## 2020-05-26 LAB — LIPID PANEL
Chol/HDL Ratio: 1.9 ratio (ref 0.0–4.4)
Cholesterol, Total: 145 mg/dL (ref 100–199)
HDL: 75 mg/dL (ref >39)
LDL Chol Calc (NIH): 60 mg/dL (ref 0–99)
Triglycerides: 40 mg/dL (ref 0–149)
VLDL Cholesterol Cal: 10 mg/dL (ref 5–40)

## 2020-05-26 LAB — ALT: ALT: 9 IU/L (ref 0–32)

## 2020-06-12 ENCOUNTER — Telehealth: Payer: Self-pay

## 2020-06-12 ENCOUNTER — Encounter: Payer: Self-pay | Admitting: Internal Medicine

## 2020-06-12 ENCOUNTER — Other Ambulatory Visit: Payer: Self-pay

## 2020-06-12 ENCOUNTER — Telehealth (INDEPENDENT_AMBULATORY_CARE_PROVIDER_SITE_OTHER): Payer: Federal, State, Local not specified - PPO | Admitting: Nurse Practitioner

## 2020-06-12 ENCOUNTER — Encounter: Payer: Self-pay | Admitting: Nurse Practitioner

## 2020-06-12 VITALS — Temp 97.3°F

## 2020-06-12 DIAGNOSIS — G44209 Tension-type headache, unspecified, not intractable: Secondary | ICD-10-CM | POA: Diagnosis not present

## 2020-06-12 DIAGNOSIS — Z20822 Contact with and (suspected) exposure to covid-19: Secondary | ICD-10-CM

## 2020-06-12 LAB — POC COVID19 BINAXNOW: SARS Coronavirus 2 Ag: NEGATIVE

## 2020-06-12 NOTE — Telephone Encounter (Signed)
THE PT GAVE CONSENT TO DO A VIRTUAL MYCHART VISIT.  THE PT SAID SHE HAS BEEN EXPOSED TO COVID.

## 2020-06-12 NOTE — Progress Notes (Signed)
Virtual Visit via failed video so called her.    This visit type was conducted due to national recommendations for restrictions regarding the COVID-19 Pandemic (e.g. social distancing) in an effort to limit this patient's exposure and mitigate transmission in our community.  Due to her co-morbid illnesses, this patient is at least at moderate risk for complications without adequate follow up.  This format is felt to be most appropriate for this patient at this time.  All issues noted in this document were discussed and addressed.  A limited physical exam was performed with this format.    This visit type was conducted due to national recommendations for restrictions regarding the COVID-19 Pandemic (e.g. social distancing) in an effort to limit this patient's exposure and mitigate transmission in our community.  Patients identity confirmed using two different identifiers.  This format is felt to be most appropriate for this patient at this time.  All issues noted in this document were discussed and addressed.  No physical exam was performed (except for noted visual exam findings with Video Visits).    Date:  06/12/2020   ID:  Gavin Potters, DOB [birth year], MRN [id number]  Patient Location:  Home   Provider location:   Office    Chief Complaint:  Patient feels like she has covid. Her symptoms started last night and she had a low grade fever. She has taken tylenol and she wasn't able to taste much this morning. There was a girl at her work that passed away from [employer].   History of Present Illness:    Anita Duncan is a 66 y.o. female who presents via video conferencing for a telehealth visit today.    The patient has symptoms concerning for COVID-19 infection which includes some chills, low grade fever, low grade fever, no HA, No cough.   She was having chills, she was going to go to work. She has been having some issues with her ears. Low fever last night. She took tylenol. She cant  taste.  She works Comptroller. One girl passed away from COVID. No headache, no cough.     Past Medical History:  Diagnosis Date  . Hypothyroidism    Past Surgical History:  Procedure Laterality Date  . BREAST BIOPSY Right   . BREAST BIOPSY Right   . BREAST EXCISIONAL BIOPSY Right   . BREAST LUMPECTOMY    . CESAREAN SECTION       Current Meds  Medication Sig  . Chlorphen-PE-Acetaminophen (NOREL AD) 4-10-325 MG TABS TAKE ONE TABLET BY MOUTH TWICE A DAY  . fluticasone (FLONASE) 50 MCG/ACT nasal spray Place 1 spray into both nostrils daily.  Marland Kitchen levocetirizine (XYZAL) 5 MG tablet TAKE 1 TABLET BY MOUTH EVERY DAY IN THE EVENING  . rosuvastatin (CRESTOR) 10 MG tablet Take 1 tablet (10 mg total) by mouth daily.  Marland Kitchen SYNTHROID 100 MCG tablet TAKE 1 TABLET FRIDAY       THROUGH SUNDAY  . SYNTHROID 88 MCG tablet TAKE 1 TABLET MONDAY       THROUGH THURSDAY  . Vitamin D, Ergocalciferol, (DRISDOL) 1.25 MG (50000 UNIT) CAPS capsule TAKE 1 CAPSULE BY MOUTH ON TUESDAYS AND FRIDAYS     Allergies:   Codeine and Sulfa antibiotics   Social History   Tobacco Use  . Smoking status: Current Every Day Smoker    Packs/day: 0.50    Years: 44.00    Pack years: 22.00    Types: Cigarettes  . Smokeless tobacco: Former Neurosurgeon  .  Tobacco comment: Encouraged to cut back on number of cigs smoked  Vaping Use  . Vaping Use: Never used  Substance Use Topics  . Alcohol use: Yes    Alcohol/week: 1.0 standard drink    Types: 1 Glasses of wine per week    Comment: 2 x a year  . Drug use: No     Family Hx: The patient's family history includes Breast cancer (age of onset: 55) in her cousin.  ROS:   Please see the history of present illness.    Review of Systems  Constitutional: Positive for chills and fever. Negative for malaise/fatigue.  HENT: Positive for sore throat.   Respiratory: Negative for cough, sputum production, shortness of breath and wheezing.   Gastrointestinal: Negative for diarrhea and  nausea.  Musculoskeletal: Negative for myalgias.  Neurological: Negative for weakness and headaches.    All other systems reviewed and are negative.   Labs/Other Tests and Data Reviewed:    Recent Labs: 02/26/2020: BUN 12; Creatinine, Ser 0.87; Hemoglobin 12.6; Platelets 266; Potassium 4.5; Sodium 141; TSH 3.430 05/26/2020: ALT 9   Recent Lipid Panel Lab Results  Component Value Date/Time   CHOL 145 05/26/2020 08:35 AM   TRIG 40 05/26/2020 08:35 AM   HDL 75 05/26/2020 08:35 AM   CHOLHDL 1.9 05/26/2020 08:35 AM   LDLCALC 60 05/26/2020 08:35 AM    Wt Readings from Last 3 Encounters:  02/26/20 177 lb 3.2 oz (80.4 kg)  02/25/20 175 lb 12.8 oz (79.7 kg)  11/08/19 177 lb 12.8 oz (80.6 kg)     Exam:    Vital Signs:  Temp (!) 97.3 F (36.3 C) (Oral)     Physical Exam Vitals and nursing note reviewed.  Constitutional:      Appearance: She is well-developed.  HENT:     Head: Normocephalic and atraumatic.  Pulmonary:     Effort: Pulmonary effort is normal.  Neurological:     Mental Status: She is alert and oriented to person, place, and time.  Psychiatric:        Mood and Affect: Affect normal.     ASSESSMENT & PLAN:     1. Acute non intractable tension-type headache -Will test patient for COVID -19  -Advised patient to take OTC tylenol or ibuprofen as needed for headache.   2. Exposure to COVID 19 virus   -Will test patient for COVID 19 -POC COVID-19  -Noval Coronavirus NAA ([facility])  -Advised patient to take Vitamin C, D, Zinc. Keep yourself hydrated with a lot of water and rest. Take Delsym for cough and Mucinex. Take Tylenol or pain reliever every 4-6 hours as needed for pain/fever/body ache. Educated patient if symptoms get worse or if she experiences any SOB or pain in her legs to seek immediate emergency care. Continue to monitor your pulse oxygen. Call us if you have any questions. Quarantine for 5 days if tested positive and no symptoms or 10 days if tested  positive and have symptoms. Wear a mask around other people.    COVID-19 Education: The signs and symptoms of COVID-19 were discussed with the patient and how to seek care for testing (follow up with PCP or arrange E-visit).  The importance of social distancing was discussed today.  Patient Risk:   After full review of this patients clinical status, I feel that they are at least moderate risk at this time.  Time:   Today, I have spent 20 minutes with the patient with telehealth technology discussing above diagnoses.  Medication Adjustments/Labs and Tests Ordered: Current medicines are reviewed at length with the patient today.  Concerns regarding medicines are outlined above.   Tests Ordered: No orders of the defined types were placed in this encounter.   Medication Changes: No orders of the defined types were placed in this encounter.   Disposition:  Follow up if your symptoms get worse or do not improve.   Signed, Charlesetta Ivory, NP

## 2020-06-13 ENCOUNTER — Encounter: Payer: Self-pay | Admitting: Nurse Practitioner

## 2020-06-14 LAB — NOVEL CORONAVIRUS, NAA: SARS-CoV-2, NAA: NOT DETECTED

## 2020-06-14 LAB — SARS-COV-2, NAA 2 DAY TAT

## 2020-06-15 ENCOUNTER — Encounter: Payer: Self-pay | Admitting: Internal Medicine

## 2020-06-15 ENCOUNTER — Other Ambulatory Visit: Payer: Self-pay | Admitting: Internal Medicine

## 2020-06-23 ENCOUNTER — Other Ambulatory Visit: Payer: Self-pay | Admitting: Internal Medicine

## 2020-07-07 ENCOUNTER — Other Ambulatory Visit: Payer: Self-pay | Admitting: Obstetrics and Gynecology

## 2020-07-07 DIAGNOSIS — Z1231 Encounter for screening mammogram for malignant neoplasm of breast: Secondary | ICD-10-CM

## 2020-07-14 ENCOUNTER — Other Ambulatory Visit: Payer: Self-pay

## 2020-07-14 ENCOUNTER — Encounter: Payer: Self-pay | Admitting: Nurse Practitioner

## 2020-07-14 ENCOUNTER — Ambulatory Visit: Payer: Federal, State, Local not specified - PPO | Admitting: Nurse Practitioner

## 2020-07-14 VITALS — BP 122/88 | HR 96 | Temp 97.7°F | Ht 62.8 in | Wt 175.0 lb

## 2020-07-14 DIAGNOSIS — H9202 Otalgia, left ear: Secondary | ICD-10-CM

## 2020-07-14 MED ORDER — AMOXICILLIN-POT CLAVULANATE 875-125 MG PO TABS
1.0000 | ORAL_TABLET | Freq: Two times a day (BID) | ORAL | 0 refills | Status: AC
Start: 2020-07-14 — End: 2020-07-21

## 2020-07-14 NOTE — Progress Notes (Signed)
Tomasa Hose as a scribe for Arnette Felts, FNP.,have documented all relevant documentation on the behalf of Arnette Felts, FNP,as directed by  Arnette Felts, FNP while in the presence of Arnette Felts, FNP. This visit occurred during the SARS-CoV-2 public health emergency.  Safety protocols were in place, including screening questions prior to the visit, additional usage of staff PPE, and extensive cleaning of exam room while observing appropriate contact time as indicated for disinfecting solutions.  Subjective:     Patient ID: Anita Duncan , female    DOB: 08-30-1955 , 65 y.o.   MRN: [id number]   Chief Complaint  Patient presents with  . Ear Drainage    HPI  Pt here today for ear drainage from her left ear. She tried schedule an appt with Dr. Ezzard Standing but they were booked. Ruptured ear drum due to eustacian tube blocked.  On 26th she could not sit up without draining.    Ear Drainage  There is pain in the left ear. This is a recurrent problem. Associated symptoms include ear discharge. Pertinent negatives include no headaches.     Past Medical History:  Diagnosis Date  . Hypothyroidism      Family History  Problem Relation Age of Onset  . Breast cancer Cousin 31     Current Outpatient Medications:  .  Chlorphen-PE-Acetaminophen (NOREL AD) 4-10-325 MG TABS, TAKE ONE TABLET BY MOUTH TWICE A DAY, Disp: 20 tablet, Rfl: 0 .  fluticasone (FLONASE) 50 MCG/ACT nasal spray, PLACE 1 SPRAY INTO BOTH NOSTRILS DAILY., Disp: 16 mL, Rfl: 2 .  levocetirizine (XYZAL) 5 MG tablet, TAKE 1 TABLET BY MOUTH EVERY DAY IN THE EVENING, Disp: 90 tablet, Rfl: 1 .  rosuvastatin (CRESTOR) 10 MG tablet, Take 1 tablet (10 mg total) by mouth daily., Disp: 90 tablet, Rfl: 3 .  SYNTHROID 100 MCG tablet, TAKE 1 TABLET FRIDAY       THROUGH SUNDAY, Disp: 39 tablet, Rfl: 1 .  SYNTHROID 88 MCG tablet, TAKE 1 TABLET MONDAY       THROUGH THURSDAY, Disp: 52 tablet, Rfl: 2 .  Vitamin D, Ergocalciferol,  (DRISDOL) 1.25 MG (50000 UNIT) CAPS capsule, TAKE 1 CAPSULE BY MOUTH ON TUESDAYS AND FRIDAYS, Disp: 24 capsule, Rfl: 0   Allergies  Allergen Reactions  . Codeine Nausea Only  . Sulfa Antibiotics Nausea And Vomiting     Review of Systems  Constitutional: Negative.  Negative for fatigue.  HENT: Positive for ear discharge.        Both ears  Endocrine: Negative for polydipsia, polyphagia and polyuria.  Musculoskeletal: Negative.   Skin: Negative.   Neurological: Negative for dizziness and headaches.  Psychiatric/Behavioral: Negative.      Today's Vitals   07/14/20 1644  BP: 122/88  Pulse: 96  Temp: 97.7 F (36.5 C)  TempSrc: Oral  Weight: 175 lb (79.4 kg)  Height: 5' 2.8" (1.595 m)   Body mass index is 31.2 kg/m.  Wt Readings from Last 3 Encounters:  07/14/20 175 lb (79.4 kg)  02/26/20 177 lb 3.2 oz (80.4 kg)  02/25/20 175 lb 12.8 oz (79.7 kg)   Objective:  Physical Exam Vitals reviewed.  Constitutional:      General: She is not in acute distress. HENT:     Head: Normocephalic.     Right Ear: Tympanic membrane, ear canal and external ear normal. There is no impacted cerumen.     Left Ear: Tympanic membrane, ear canal and external ear normal. There is no impacted cerumen.  Ears:     Comments: Left ear canal reddened and appears perforated Eyes:     Extraocular Movements: Extraocular movements intact.     Conjunctiva/sclera: Conjunctivae normal.     Pupils: Pupils are equal, round, and reactive to light.  Pulmonary:     Effort: Pulmonary effort is normal. No respiratory distress.  Skin:    General: Skin is warm and dry.     Capillary Refill: Capillary refill takes less than 2 seconds.  Neurological:     General: No focal deficit present.     Mental Status: She is oriented to person, place, and time.     Cranial Nerves: No cranial nerve deficit.  Psychiatric:        Mood and Affect: Mood normal.        Behavior: Behavior normal.        Thought Content:  Thought content normal.        Judgment: Judgment normal.         Assessment And Plan:     1. Acute otalgia, left  Erythematous present to canal and appears to have some yellow drainage  She is to follow up with her ENT  Will treat with augmentin as well - amoxicillin-clavulanate (AUGMENTIN) 875-125 MG tablet; Take 1 tablet by mouth 2 (two) times daily for 7 days.  Dispense: 14 tablet; Refill: 0     Patient was given opportunity to ask questions. Patient verbalized understanding of the plan and was able to repeat key elements of the plan. All questions were answered to their satisfaction.  Arnette Felts, FNP   I, Arnette Felts, FNP, have reviewed all documentation for this visit. The documentation on 07/14/20 for the exam, diagnosis, procedures, and orders are all accurate and complete.   THE PATIENT IS ENCOURAGED TO PRACTICE SOCIAL DISTANCING DUE TO THE COVID-19 PANDEMIC.

## 2020-07-21 ENCOUNTER — Encounter: Payer: Self-pay | Admitting: Internal Medicine

## 2020-07-23 ENCOUNTER — Other Ambulatory Visit: Payer: Self-pay | Admitting: Internal Medicine

## 2020-07-29 ENCOUNTER — Ambulatory Visit: Payer: Federal, State, Local not specified - PPO | Admitting: Orthopedic Surgery

## 2020-07-31 ENCOUNTER — Telehealth: Payer: Self-pay

## 2020-07-31 ENCOUNTER — Encounter (INDEPENDENT_AMBULATORY_CARE_PROVIDER_SITE_OTHER): Payer: Self-pay | Admitting: Otolaryngology

## 2020-07-31 ENCOUNTER — Encounter: Payer: Self-pay | Admitting: Orthopedic Surgery

## 2020-07-31 ENCOUNTER — Ambulatory Visit (INDEPENDENT_AMBULATORY_CARE_PROVIDER_SITE_OTHER): Admitting: Orthopedic Surgery

## 2020-07-31 ENCOUNTER — Ambulatory Visit (INDEPENDENT_AMBULATORY_CARE_PROVIDER_SITE_OTHER): Payer: Federal, State, Local not specified - PPO | Admitting: Otolaryngology

## 2020-07-31 ENCOUNTER — Other Ambulatory Visit: Payer: Self-pay

## 2020-07-31 VITALS — Temp 97.7°F

## 2020-07-31 DIAGNOSIS — H60312 Diffuse otitis externa, left ear: Secondary | ICD-10-CM

## 2020-07-31 DIAGNOSIS — H6122 Impacted cerumen, left ear: Secondary | ICD-10-CM

## 2020-07-31 DIAGNOSIS — M7541 Impingement syndrome of right shoulder: Secondary | ICD-10-CM

## 2020-07-31 NOTE — Progress Notes (Signed)
HPI: Anita Duncan is a 65 y.o. female who returns today for evaluation of ears.  She has had some drainage from the left ear.  She also needs refill previous eardrops I gave her.  I apparently saw her 5 months ago with ear canal infections and a left TM perforation.  Her right ear is doing better but more recently her left ear has had some discomfort and drainage.  She has Diprolene ointment that he uses for itching in the ears..  Past Medical History:  Diagnosis Date  . Hypothyroidism    Past Surgical History:  Procedure Laterality Date  . BREAST BIOPSY Right   . BREAST BIOPSY Right   . BREAST EXCISIONAL BIOPSY Right   . BREAST LUMPECTOMY    . CESAREAN SECTION     Social History   Socioeconomic History  . Marital status: Single    Spouse name: Not on file  . Number of children: Not on file  . Years of education: Not on file  . Highest education level: Not on file  Occupational History  . Not on file  Tobacco Use  . Smoking status: Current Every Day Smoker    Packs/day: 0.50    Years: 44.00    Pack years: 22.00    Types: Cigarettes    Start date: 82  . Smokeless tobacco: Former Neurosurgeon  . Tobacco comment: Encouraged to cut back on number of cigs smoked  Vaping Use  . Vaping Use: Never used  Substance and Sexual Activity  . Alcohol use: Yes    Alcohol/week: 1.0 standard drink    Types: 1 Glasses of wine per week    Comment: 2 x a year  . Drug use: No  . Sexual activity: Not Currently  Other Topics Concern  . Not on file  Social History Narrative  . Not on file   Social Determinants of Health   Financial Resource Strain: Not on file  Food Insecurity: Not on file  Transportation Needs: Not on file  Physical Activity: Not on file  Stress: Not on file  Social Connections: Not on file   Family History  Problem Relation Age of Onset  . Breast cancer Cousin 31   Allergies  Allergen Reactions  . Codeine Nausea Only  . Sulfa Antibiotics Nausea And Vomiting    Prior to Admission medications   Medication Sig Start Date End Date Taking? Authorizing Provider  Chlorphen-PE-Acetaminophen (NOREL AD) 4-10-325 MG TABS TAKE ONE TABLET BY MOUTH TWICE A DAY 02/26/19   Rodriguez-Southworth, Nettie Elm, PA-C  fluticasone (FLONASE) 50 MCG/ACT nasal spray PLACE 1 SPRAY INTO BOTH NOSTRILS DAILY. 06/23/20   Dorothyann Peng, MD  levocetirizine (XYZAL) 5 MG tablet TAKE 1 TABLET BY MOUTH EVERY DAY IN THE EVENING 05/05/20   Dorothyann Peng, MD  rosuvastatin (CRESTOR) 10 MG tablet Take 1 tablet (10 mg total) by mouth daily. 02/25/20   Jake Bathe, MD  SYNTHROID 100 MCG tablet TAKE 1 TABLET FRIDAY       THROUGH SUNDAY 06/16/20   Dorothyann Peng, MD  SYNTHROID 88 MCG tablet TAKE 1 TABLET MONDAY       THROUGH THURSDAY 03/08/20   Dorothyann Peng, MD  Vitamin D, Ergocalciferol, (DRISDOL) 1.25 MG (50000 UNIT) CAPS capsule TAKE 1 CAPSULE BY MOUTH ON TUESDAYS AND FRIDAYS 07/23/20   Dorothyann Peng, MD     Positive ROS: Otherwise negative  All other systems have been reviewed and were otherwise negative with the exception of those mentioned in the HPI and  as above.  Physical Exam: Constitutional: Alert, well-appearing, no acute distress Ears: External ears without lesions or tenderness.  Right ear canal and right TM are clear.  Left ear canal reveals infection with some granulation tissue adjacent to the left TM.  Findings are consistent with a fungal external otitis.  Ear canal was copiously cleaned with hydroperoxide and alcohol or ear rinses.  Following this I applied gentian violet, Ciprodex and CSF powder to the left ear only.  The TM had good mobility on pneumatic otoscopy with no persistent TM perforation. Nasal: External nose without lesions. Septum with mild deformity.. Clear nasal passages otherwise. Oral: Lips and gums without lesions. Tongue and palate mucosa without lesions. Posterior oropharynx clear. Neck: No palpable adenopathy or masses Respiratory: Breathing comfortably   Skin: No facial/neck lesions or rash noted.  Cerumen impaction removal  Date/Time: 07/31/2020 4:08 PM Performed by: Drema Halon, MD Authorized by: Drema Halon, MD   Consent:    Consent obtained:  Verbal   Consent given by:  Patient   Risks discussed:  Pain and bleeding Procedure details:    Location:  L ear   Procedure type: suction   Post-procedure details:    Inspection:  TM intact and canal normal   Hearing quality:  Improved   Patient tolerance of procedure:  Tolerated well, no immediate complications Comments:     Left ear canal with debris and probable fungal external otitis.  This was cleaned with hydroperoxide and alcohol and suction.  After cleaning the ear canal I applied gentian violet, Ciprodex and CSF powder.    Assessment: Left external otitis.  Plan: Ear canal was cleaned in the office and applied gentian violet, Ciprodex and CSF powder to the left ear only.  Recommend keeping the ear canal dry. Gave her a prescription for Cortisporin otic suspension drops to use if she develops any drainage or persistent pain in the ear. She will follow-up as needed. Of note compared to previous evaluation the left TM perforation has healed.  And the TM has good mobility on pneumatic otoscopy.   Narda Bonds, MD

## 2020-07-31 NOTE — Telephone Encounter (Signed)
The pt was upset because her flma form had Lifelong for the length of time needed on the FLMA form the pt said that she wrote down what all she needed the FLMA for and that she doesn't understand why what she put on the paper wasn't on the flma form.  I told the pt that I didn't know because I wasn't the one who gave the forms to Dr. Allyne Gee. The pt said that she didn't say that it was me and explained that  I was only trying to respond to the pt.  The pt said that she doesn't have time to be paying $25 for the form again cause it wasn't filled out right the first time.  The pt was told that Dr. Allyne Gee wouldn't have the pt to repay for an error that she made.  The pt said she would get back in touch with the office if something is wrong with her FLMa. The provider was notified and said this is the last time for FLMA form.

## 2020-07-31 NOTE — Progress Notes (Signed)
Office Visit Note   Patient: Anita Duncan           Date of Birth: 1956-03-05           MRN: [id number] Visit Date: 07/31/2020              Requested by: Dorothyann Peng, MD [address] [place],  [place] [address] PCP: Dorothyann Peng, MD  Chief Complaint  Patient presents with  . Right Shoulder - Follow-up      HPI: Patient is a 65 year old woman who was seen in follow-up for impingement symptoms right shoulder from a work-related injury.  Patient works for the [employer] and she presents with needing updates to her CA 72.  She denies any recent injuries she states she does have pain with changes in the weather she has had some muscle cramps in her arms and hands and she has used breathing exercises and coconut water.  Assessment & Plan: Visit Diagnoses:  1. Impingement syndrome of right shoulder     Plan: Continue with the current work restrictions.  Follow-Up Instructions: Return if symptoms worsen or fail to improve.   Ortho Exam  Patient is alert, oriented, no adenopathy, well-dressed, normal affect, normal respiratory effort. Examination patient has abduction flexion to 90 degrees right shoulder pain with Neer and Hawkins impingement test.  No focal motor weakness.  No neck pain.  Imaging: No results found. No images are attached to the encounter.  Labs: Lab Results  Component Value Date   HGBA1C 6.1 (H) 02/26/2020   HGBA1C 5.9 (H) 08/21/2019   HGBA1C 5.9 (H) 02/20/2019   ESRSEDRATE 22 01/12/2011     Lab Results  Component Value Date   ALBUMIN 4.6 02/26/2020   ALBUMIN 4.3 02/20/2019   ALBUMIN 4.4 02/13/2018    No results found for: MG Lab Results  Component Value Date   VD25OH 18.0 (L) 02/26/2020    No results found for: PREALBUMIN CBC EXTENDED Latest Ref Rng & Units 02/26/2020 02/20/2019 02/13/2018  WBC 3.4 - 10.8 x10E3/uL 7.6 6.7 6.5  RBC 3.77 - 5.28 x10E6/uL 4.29 4.11 4.34  HGB 11.1 - 15.9 g/dL 21.3 08.6 57.8  HCT 46.9 - 46.6 %  38.1 35.3 37.8  PLT 150 - 450 x10E3/uL 266 238 272  NEUTROABS 1.7 - 7.7 K/uL - - -  LYMPHSABS 0.7 - 4.0 K/uL - - -     There is no height or weight on file to calculate BMI.  Orders:  No orders of the defined types were placed in this encounter.  No orders of the defined types were placed in this encounter.    Procedures: No procedures performed  Clinical Data: No additional findings.  ROS:  All other systems negative, except as noted in the HPI. Review of Systems  Objective: Vital Signs: There were no vitals taken for this visit.  Specialty Comments:  No specialty comments available.  PMFS History: Patient Active Problem List   Diagnosis Date Noted  . Impingement syndrome of right shoulder 06/21/2016   Past Medical History:  Diagnosis Date  . Hypothyroidism     Family History  Problem Relation Age of Onset  . Breast cancer Cousin 31    Past Surgical History:  Procedure Laterality Date  . BREAST BIOPSY Right   . BREAST BIOPSY Right   . BREAST EXCISIONAL BIOPSY Right   . BREAST LUMPECTOMY    . CESAREAN SECTION     Social History   Occupational History  .  Not on file  Tobacco Use  . Smoking status: Current Every Day Smoker    Packs/day: 0.50    Years: 44.00    Pack years: 22.00    Types: Cigarettes  . Smokeless tobacco: Former Neurosurgeon  . Tobacco comment: Encouraged to cut back on number of cigs smoked  Vaping Use  . Vaping Use: Never used  Substance and Sexual Activity  . Alcohol use: Yes    Alcohol/week: 1.0 standard drink    Types: 1 Glasses of wine per week    Comment: 2 x a year  . Drug use: No  . Sexual activity: Not Currently

## 2020-08-25 ENCOUNTER — Ambulatory Visit
Admission: RE | Admit: 2020-08-25 | Discharge: 2020-08-25 | Disposition: A | Discharge status: Home / Self Care | Payer: Federal, State, Local not specified - PPO | Source: Ambulatory Visit | Attending: Obstetrics and Gynecology | Admitting: Obstetrics and Gynecology

## 2020-08-25 ENCOUNTER — Other Ambulatory Visit: Payer: Self-pay

## 2020-08-25 DIAGNOSIS — Z1231 Encounter for screening mammogram for malignant neoplasm of breast: Secondary | ICD-10-CM | POA: Diagnosis not present

## 2020-08-26 ENCOUNTER — Encounter: Payer: Self-pay | Admitting: Internal Medicine

## 2020-08-26 ENCOUNTER — Ambulatory Visit: Payer: Federal, State, Local not specified - PPO | Admitting: Internal Medicine

## 2020-08-26 VITALS — BP 128/78 | HR 98 | Temp 98.1°F | Ht 62.0 in | Wt 179.6 lb

## 2020-08-26 DIAGNOSIS — J301 Allergic rhinitis due to pollen: Secondary | ICD-10-CM

## 2020-08-26 DIAGNOSIS — E039 Hypothyroidism, unspecified: Secondary | ICD-10-CM | POA: Diagnosis not present

## 2020-08-26 DIAGNOSIS — R7309 Other abnormal glucose: Secondary | ICD-10-CM

## 2020-08-26 DIAGNOSIS — Z6832 Body mass index (BMI) 32.0-32.9, adult: Secondary | ICD-10-CM

## 2020-08-26 DIAGNOSIS — E6609 Other obesity due to excess calories: Secondary | ICD-10-CM | POA: Diagnosis not present

## 2020-08-26 NOTE — Progress Notes (Signed)
I,Tianna Badgett,acting as a Education administrator for Maximino Greenland, MD.,have documented all relevant documentation on the behalf of Maximino Greenland, MD,as directed by  Maximino Greenland, MD while in the presence of Maximino Greenland, MD.  This visit occurred during the SARS-CoV-2 public health emergency.  Safety protocols were in place, including screening questions prior to the visit, additional usage of staff PPE, and extensive cleaning of exam room while observing appropriate contact time as indicated for disinfecting solutions.  Subjective:     Patient ID: Anita Duncan , female    DOB: [birth year] , 65 y.o.   MRN: [id number]   Chief Complaint  Patient presents with  . Thyroid Problem    HPI  She presents today for thyroid check. She reports compliance with meds. She denies having any ill effects from thyroid supplementation.   Thyroid Problem Presents for follow-up visit. Patient reports no cold intolerance or constipation. The symptoms have been stable.     Past Medical History:  Diagnosis Date  . Hypothyroidism      Family History  Problem Relation Age of Onset  . Breast cancer Cousin 31     Current Outpatient Medications:  .  Chlorphen-PE-Acetaminophen (NOREL AD) 4-10-325 MG TABS, TAKE ONE TABLET BY MOUTH TWICE A DAY, Disp: 20 tablet, Rfl: 0 .  fluticasone (FLONASE) 50 MCG/ACT nasal spray, PLACE 1 SPRAY INTO BOTH NOSTRILS DAILY., Disp: 16 mL, Rfl: 2 .  levocetirizine (XYZAL) 5 MG tablet, TAKE 1 TABLET BY MOUTH EVERY DAY IN THE EVENING, Disp: 90 tablet, Rfl: 1 .  rosuvastatin (CRESTOR) 10 MG tablet, Take 1 tablet (10 mg total) by mouth daily., Disp: 90 tablet, Rfl: 3 .  SYNTHROID 100 MCG tablet, TAKE 1 TABLET FRIDAY       THROUGH SUNDAY, Disp: 39 tablet, Rfl: 1 .  SYNTHROID 88 MCG tablet, TAKE 1 TABLET MONDAY       THROUGH THURSDAY, Disp: 52 tablet, Rfl: 2 .  Vitamin D, Ergocalciferol, (DRISDOL) 1.25 MG (50000 UNIT) CAPS capsule, TAKE 1 CAPSULE BY MOUTH ON TUESDAYS AND FRIDAYS, Disp:  24 capsule, Rfl: 0   Allergies  Allergen Reactions  . Amoxicillin   . Codeine Nausea Only  . Sulfa Antibiotics Nausea And Vomiting     Review of Systems  Constitutional: Negative.   Respiratory: Negative.   Cardiovascular: Negative.   Gastrointestinal: Negative.  Negative for constipation.  Endocrine: Negative for cold intolerance.  Neurological: Negative.      Today's Vitals   08/26/20 1410  BP: 128/78  Pulse: 98  Temp: 98.1 F (36.7 C)  SpO2: 99%  Weight: 179 lb 9.6 oz (81.5 kg)  Height: 5' 2"  (1.575 m)  PainSc: 0-No pain   Body mass index is 32.85 kg/m.  Wt Readings from Last 3 Encounters:  08/26/20 179 lb 9.6 oz (81.5 kg)  07/14/20 175 lb (79.4 kg)  02/26/20 177 lb 3.2 oz (80.4 kg)   Objective:  Physical Exam Vitals and nursing note reviewed.  Constitutional:      Appearance: Normal appearance. She is obese.  HENT:     Head: Normocephalic and atraumatic.     Nose:     Comments: Masked     Mouth/Throat:     Comments: Masked  Cardiovascular:     Rate and Rhythm: Normal rate and regular rhythm.     Heart sounds: Normal heart sounds.  Pulmonary:     Effort: Pulmonary effort is normal.     Breath sounds: Normal breath sounds.  Musculoskeletal:  Cervical back: Normal range of motion.  Skin:    General: Skin is warm.  Neurological:     General: No focal deficit present.     Mental Status: She is alert.  Psychiatric:        Mood and Affect: Mood normal.        Behavior: Behavior normal.         Assessment And Plan:     1. Primary hypothyroidism Comments: I will check thyroid panel and adjust meds as needed.  She will rto in six months for a full physical exam.  She will f/u in 6 months for re-evaluation.  - TSH - T4, free  2. Other abnormal glucose Comments: Her a1c has been elevated in the past. I will recheck this today. She is encouraged to avoid sugary beverages.  - Hemoglobin A1c - CMP14+EGFR  3. Seasonal allergic rhinitis due to  pollen Comments: She will c/w Xyzal and fluticasone NS.  Encouraged to wear mask when outdoors.   4. Class 1 obesity due to excess calories with serious comorbidity and body mass index (BMI) of 32.0 to 32.9 in adult  She is encouraged to strive for BMI less than 30 to decrease cardiac risk. Advised to aim for at least 150 minutes of exercise per week.  Patient was given opportunity to ask questions. Patient verbalized understanding of the plan and was able to repeat key elements of the plan. All questions were answered to their satisfaction.   I, Maximino Greenland, MD, have reviewed all documentation for this visit. The documentation on 08/26/20 for the exam, diagnosis, procedures, and orders are all accurate and complete.   IF YOU HAVE BEEN REFERRED TO A SPECIALIST, IT MAY TAKE 1-2 WEEKS TO SCHEDULE/PROCESS THE REFERRAL. IF YOU HAVE NOT HEARD FROM US/SPECIALIST IN TWO WEEKS, PLEASE GIVE [place] A CALL AT [phone].   THE PATIENT IS ENCOURAGED TO PRACTICE SOCIAL DISTANCING DUE TO THE COVID-19 PANDEMIC.

## 2020-08-26 NOTE — Patient Instructions (Signed)
Hypothyroidism  Hypothyroidism is when the thyroid gland does not make enough of certain hormones (it is underactive). The thyroid gland is a small gland located in the lower front part of the neck, just in front of the windpipe (trachea). This gland makes hormones that help control how the body uses food for energy (metabolism) as well as how the heart and brain function. These hormones also play a role in keeping your bones strong. When the thyroid is underactive, it produces too little of the hormones thyroxine (T4) and triiodothyronine (T3). What are the causes? This condition may be caused by:  Hashimoto's disease. This is a disease in which the body's disease-fighting system (immune system) attacks the thyroid gland. This is the most common cause.  Viral infections.  Pregnancy.  Certain medicines.  Birth defects.  Past radiation treatments to the head or neck for cancer.  Past treatment with radioactive iodine.  Past exposure to radiation in the environment.  Past surgical removal of part or all of the thyroid.  Problems with a gland in the center of the brain (pituitary gland).  Lack of enough iodine in the diet. What increases the risk? You are more likely to develop this condition if:  You are female.  You have a family history of thyroid conditions.  You use a medicine called lithium.  You take medicines that affect the immune system (immunosuppressants). What are the signs or symptoms? Symptoms of this condition include:  Feeling as though you have no energy (lethargy).  Not being able to tolerate cold.  Weight gain that is not explained by a change in diet or exercise habits.  Lack of appetite.  Dry skin.  Coarse hair.  Menstrual irregularity.  Slowing of thought processes.  Constipation.  Sadness or depression. How is this diagnosed? This condition may be diagnosed based on:  Your symptoms, your medical history, and a physical exam.  Blood  tests. You may also have imaging tests, such as an ultrasound or MRI. How is this treated? This condition is treated with medicine that replaces the thyroid hormones that your body does not make. After you begin treatment, it may take several weeks for symptoms to go away. Follow these instructions at home:  Take over-the-counter and prescription medicines only as told by your health care provider.  If you start taking any new medicines, tell your health care provider.  Keep all follow-up visits as told by your health care provider. This is important. ? As your condition improves, your dosage of thyroid hormone medicine may change. ? You will need to have blood tests regularly so that your health care provider can monitor your condition. Contact a health care provider if:  Your symptoms do not get better with treatment.  You are taking thyroid hormone replacement medicine and you: ? Sweat a lot. ? Have tremors. ? Feel anxious. ? Lose weight rapidly. ? Cannot tolerate heat. ? Have emotional swings. ? Have diarrhea. ? Feel weak. Get help right away if you have:  Chest pain.  An irregular heartbeat.  A rapid heartbeat.  Difficulty breathing. Summary  Hypothyroidism is when the thyroid gland does not make enough of certain hormones (it is underactive).  When the thyroid is underactive, it produces too little of the hormones thyroxine (T4) and triiodothyronine (T3).  The most common cause is Hashimoto's disease, a disease in which the body's disease-fighting system (immune system) attacks the thyroid gland. The condition can also be caused by viral infections, medicine, pregnancy, or   past radiation treatment to the head or neck.  Symptoms may include weight gain, dry skin, constipation, feeling as though you do not have energy, and not being able to tolerate cold.  This condition is treated with medicine to replace the thyroid hormones that your body does not make. This  information is not intended to replace advice given to you by your health care provider. Make sure you discuss any questions you have with your health care provider. Document Revised: 02/01/2020 Document Reviewed: 01/17/2020 Elsevier Patient Education  2021 Elsevier Inc.  

## 2020-08-27 LAB — HEMOGLOBIN A1C
Est. average glucose Bld gHb Est-mCnc: 131 mg/dL
Hgb A1c MFr Bld: 6.2 % — ABNORMAL HIGH (ref 4.8–5.6)

## 2020-08-27 LAB — CMP14+EGFR
ALT: 14 IU/L (ref 0–32)
AST: 21 IU/L (ref 0–40)
Albumin/Globulin Ratio: 1.8 (ref 1.2–2.2)
Albumin: 4.4 g/dL (ref 3.8–4.8)
Alkaline Phosphatase: 79 IU/L (ref 44–121)
BUN/Creatinine Ratio: 24 (ref 12–28)
BUN: 20 mg/dL (ref 8–27)
Bilirubin Total: 0.3 mg/dL (ref 0.0–1.2)
CO2: 23 mmol/L (ref 20–29)
Calcium: 9.6 mg/dL (ref 8.7–10.3)
Chloride: 108 mmol/L — ABNORMAL HIGH (ref 96–106)
Creatinine, Ser: 0.83 mg/dL (ref 0.57–1.00)
Globulin, Total: 2.4 g/dL (ref 1.5–4.5)
Glucose: 95 mg/dL (ref 65–99)
Potassium: 4.3 mmol/L (ref 3.5–5.2)
Sodium: 145 mmol/L — ABNORMAL HIGH (ref 134–144)
Total Protein: 6.8 g/dL (ref 6.0–8.5)
eGFR: 79 mL/min/{1.73_m2} (ref >59)

## 2020-08-27 LAB — T4, FREE: Free T4: 1.29 ng/dL (ref 0.82–1.77)

## 2020-08-27 LAB — TSH: TSH: 0.478 u[IU]/mL (ref 0.450–4.500)

## 2020-09-01 ENCOUNTER — Ambulatory Visit: Payer: Federal, State, Local not specified - PPO

## 2020-09-03 ENCOUNTER — Ambulatory Visit: Payer: Federal, State, Local not specified - PPO

## 2020-09-29 ENCOUNTER — Other Ambulatory Visit: Payer: Self-pay

## 2020-09-29 ENCOUNTER — Ambulatory Visit: Payer: Federal, State, Local not specified - PPO | Admitting: Cardiology

## 2020-09-29 ENCOUNTER — Encounter: Payer: Self-pay | Admitting: Cardiology

## 2020-09-29 VITALS — BP 130/90 | HR 67 | Ht 60.0 in | Wt 177.2 lb

## 2020-09-29 DIAGNOSIS — I2510 Atherosclerotic heart disease of native coronary artery without angina pectoris: Secondary | ICD-10-CM

## 2020-09-29 DIAGNOSIS — I2584 Coronary atherosclerosis due to calcified coronary lesion: Secondary | ICD-10-CM

## 2020-09-29 DIAGNOSIS — E7800 Pure hypercholesterolemia, unspecified: Secondary | ICD-10-CM

## 2020-09-29 NOTE — Patient Instructions (Signed)
Medication Instructions:  Your physician recommends that you continue on your current medications as directed. Please refer to the Current Medication list given to you today.  *If you need a refill on your cardiac medications before your next appointment, please call your pharmacy*   Lab Work: None If you have labs (blood work) drawn today and your tests are completely normal, you will receive your results only by: . MyChart Message (if you have MyChart) OR . A paper copy in the mail If you have any lab test that is abnormal or we need to change your treatment, we will call you to review the results.   Testing/Procedures: None   Follow-Up: At [facility], you and your health needs are our priority.  As part of our continuing mission to provide you with exceptional heart care, we have created designated Provider Care Teams.  These Care Teams include your primary Cardiologist (physician) and Advanced Practice Providers (APPs -  Physician Assistants and Nurse Practitioners) who all work together to provide you with the care you need, when you need it.  We recommend signing up for the patient portal called "MyChart".  Sign up information is provided on this After Visit Summary.  MyChart is used to connect with patients for Virtual Visits (Telemedicine).  Patients are able to view lab/test results, encounter notes, upcoming appointments, etc.  Non-urgent messages can be sent to your provider as well.   To learn more about what you can do with MyChart, go to [email or web address].    Your next appointment:   1 year(s)  The format for your next appointment:   In Person  Provider:   You may see Mark Skains, MD or one of the following Advanced Practice Providers on your designated Care Team:    Jill McDaniel, NP    Other Instructions   

## 2020-09-29 NOTE — Progress Notes (Signed)
Cardiology Office Note:    Date:  09/29/2020   ID:  Anita Duncan, DOB 02-09-1956, MRN [id number]  PCP:  Dorothyann Peng, MD   [facility] Providers Cardiologist:  Donato Schultz, MD     Referring MD: Dorothyann Peng, MD     History of Present Illness:    Anita Duncan is a 65 y.o. female here for follow-up of family history of coronary artery disease, father dying at age 71 with MI, sister at age 49 had whole in back of heart she states, hyperlipidemia with prior LDL 131 currently 44, with coronary artery calcification.  Quit tobacco about a month ago, after her allergies tipped her over the edge with all of her mucus production etc.  She has been battling ear pain, eustachian tube occlusion.  Dr. Ezzard Standing.  She had a ruptured eardrum.  It took about 3 months to heal.  Her mother, also patient minus had laser surgery for glaucoma.  Dr. Burgess Estelle.  She ended up having a CT scan in preparation for watchman device with Dr. Lalla Brothers.  She had a reaction to the IV dye she believes.  Her daughter says that her skin has been peeling quite diffusely.  She spoke to our CT nurse coordinators regarding this matter.  Records reviewed.  Encouraged her to talk to Dr. Allyne Gee.  I wonder if she needs an allergist.  She has been told not to take Benadryl because of her recent glaucoma eye surgery.  Her son has lupus.  He owns a gym, vegan.  Takes good care of himself.  She is an only child.   Past Medical History:  Diagnosis Date  . Hypothyroidism     Past Surgical History:  Procedure Laterality Date  . BREAST BIOPSY Right   . BREAST BIOPSY Right   . BREAST EXCISIONAL BIOPSY Right   . BREAST LUMPECTOMY    . CESAREAN SECTION      Current Medications: Current Meds  Medication Sig  . Chlorphen-PE-Acetaminophen 4-10-325 MG TABS Take by mouth as needed.  . fluticasone (FLONASE) 50 MCG/ACT nasal spray PLACE 1 SPRAY INTO BOTH NOSTRILS DAILY.  Marland Kitchen levocetirizine (XYZAL) 5 MG tablet TAKE 1 TABLET BY  MOUTH EVERY DAY IN THE EVENING  . rosuvastatin (CRESTOR) 10 MG tablet Take 1 tablet (10 mg total) by mouth daily.  Marland Kitchen SYNTHROID 100 MCG tablet TAKE 1 TABLET FRIDAY       THROUGH SUNDAY  . SYNTHROID 88 MCG tablet TAKE 1 TABLET MONDAY       THROUGH THURSDAY  . Vitamin D, Ergocalciferol, (DRISDOL) 1.25 MG (50000 UNIT) CAPS capsule TAKE 1 CAPSULE BY MOUTH ON TUESDAYS AND FRIDAYS     Allergies:   Amoxicillin, Codeine, and Sulfa antibiotics   Social History   Socioeconomic History  . Marital status: Single    Spouse name: Not on file  . Number of children: Not on file  . Years of education: Not on file  . Highest education level: Not on file  Occupational History  . Not on file  Tobacco Use  . Smoking status: Current Every Day Smoker    Packs/day: 0.50    Years: 44.00    Pack years: 22.00    Types: Cigarettes    Start date: 25  . Smokeless tobacco: Former Neurosurgeon  . Tobacco comment: Encouraged to cut back on number of cigs smoked  Vaping Use  . Vaping Use: Never used  Substance and Sexual Activity  . Alcohol use: Yes    Alcohol/week:  1.0 standard drink    Types: 1 Glasses of wine per week    Comment: 2 x a year  . Drug use: No  . Sexual activity: Not Currently  Other Topics Concern  . Not on file  Social History Narrative  . Not on file   Social Determinants of Health   Financial Resource Strain: Not on file  Food Insecurity: Not on file  Transportation Needs: Not on file  Physical Activity: Not on file  Stress: Not on file  Social Connections: Not on file     Family History: The patient's family history includes Breast cancer (age of onset: 16) in her cousin.  ROS:   Please see the history of present illness.     All other systems reviewed and are negative.  EKGs/Labs/Other Studies Reviewed:      Recent Labs: 02/26/2020: Hemoglobin 12.6; Platelets 266 08/26/2020: ALT 14; BUN 20; Creatinine, Ser 0.83; Potassium 4.3; Sodium 145; TSH 0.478  Recent Lipid Panel     Component Value Date/Time   CHOL 145 05/26/2020 0835   TRIG 40 05/26/2020 0835   HDL 75 05/26/2020 0835   CHOLHDL 1.9 05/26/2020 0835   LDLCALC 60 05/26/2020 0835     Risk Assessment/Calculations:      Physical Exam:    VS:  BP 130/90   Pulse 67   Ht 5' (1.524 m)   Wt 177 lb 3.2 oz (80.4 kg)   SpO2 97%   BMI 34.61 kg/m     Wt Readings from Last 3 Encounters:  09/29/20 177 lb 3.2 oz (80.4 kg)  08/26/20 179 lb 9.6 oz (81.5 kg)  07/14/20 175 lb (79.4 kg)     GEN:  Well nourished, well developed in no acute distress HEENT: Normal NECK: No JVD; No carotid bruits LYMPHATICS: No lymphadenopathy CARDIAC: RRR, no murmurs, rubs, gallops RESPIRATORY:  Clear to auscultation without rales, wheezing or rhonchi  ABDOMEN: Soft, non-tender, non-distended MUSCULOSKELETAL:  No edema; No deformity  SKIN: Warm and dry NEUROLOGIC:  Alert and oriented x 3 PSYCHIATRIC:  Normal affect   ASSESSMENT:    1. Coronary artery calcification   2. Pure hypercholesterolemia    PLAN:    In order of problems listed above:  Coronary artery calcification/atherosclerosis - Doing very well with rosuvastatin 10 mg once a day.  No myalgias.  Continue with current prescription drug management.  Refills as needed.  Her LDL is now down to 60.  Hyperlipidemia - Excellent reduction in LDL with rosuvastatin 10 mg once a day.  LDL now 60.  Triglycerides 40.  HDL 45.  ALT is 14.  Tobacco cessation - Excellent job.  She quit smoking about a month ago after her seasonal allergies were increased.      Medication Adjustments/Labs and Tests Ordered: Current medicines are reviewed at length with the patient today.  Concerns regarding medicines are outlined above.  No orders of the defined types were placed in this encounter.  No orders of the defined types were placed in this encounter.   Patient Instructions  Medication Instructions:  Your physician recommends that you continue on your current  medications as directed. Please refer to the Current Medication list given to you today.  *If you need a refill on your cardiac medications before your next appointment, please call your pharmacy*   Lab Work: None If you have labs (blood work) drawn today and your tests are completely normal, you will receive your results only by: Marland Kitchen MyChart Message (if you have  MyChart) OR . A paper copy in the mail If you have any lab test that is abnormal or we need to change your treatment, we will call you to review the results.   Testing/Procedures: None   Follow-Up: At [facility], you and your health needs are our priority.  As part of our continuing mission to provide you with exceptional heart care, we have created designated Provider Care Teams.  These Care Teams include your primary Cardiologist (physician) and Advanced Practice Providers (APPs -  Physician Assistants and Nurse Practitioners) who all work together to provide you with the care you need, when you need it.  We recommend signing up for the patient portal called "MyChart".  Sign up information is provided on this After Visit Summary.  MyChart is used to connect with patients for Virtual Visits (Telemedicine).  Patients are able to view lab/test results, encounter notes, upcoming appointments, etc.  Non-urgent messages can be sent to your provider as well.   To learn more about what you can do with MyChart, go to [email or web address].    Your next appointment:   1 year(s)  The format for your next appointment:   In Person  Provider:   You may see Donato Schultz, MD or one of the following Advanced Practice Providers on your designated Care Team:    Georgie Chard, NP    Other Instructions      Signed, Donato Schultz, MD  09/29/2020 10:52 AM    [facility]

## 2020-09-30 ENCOUNTER — Encounter: Payer: Self-pay | Admitting: Internal Medicine

## 2020-10-02 ENCOUNTER — Telehealth (INDEPENDENT_AMBULATORY_CARE_PROVIDER_SITE_OTHER): Payer: Federal, State, Local not specified - PPO | Admitting: Nurse Practitioner

## 2020-10-02 ENCOUNTER — Telehealth: Payer: Self-pay

## 2020-10-02 DIAGNOSIS — R0981 Nasal congestion: Secondary | ICD-10-CM

## 2020-10-02 DIAGNOSIS — R059 Cough, unspecified: Secondary | ICD-10-CM

## 2020-10-02 MED ORDER — BENZONATATE 100 MG PO CAPS
100.0000 mg | ORAL_CAPSULE | Freq: Three times a day (TID) | ORAL | 1 refills | Status: DC | PRN
Start: 2020-10-02 — End: 2021-09-22

## 2020-10-02 MED ORDER — AZITHROMYCIN 250 MG PO TABS: ORAL_TABLET | ORAL | 0 refills | Status: AC

## 2020-10-02 NOTE — Progress Notes (Signed)
Virtual Visit via televisit    This visit type was conducted due to national recommendations for restrictions regarding the COVID-19 Pandemic (e.g. social distancing) in an effort to limit this patient's exposure and mitigate transmission in our community.  Due to her co-morbid illnesses, this patient is at least at moderate risk for complications without adequate follow up.  This format is felt to be most appropriate for this patient at this time.  All issues noted in this document were discussed and addressed.  A limited physical exam was performed with this format.    This visit type was conducted due to national recommendations for restrictions regarding the COVID-19 Pandemic (e.g. social distancing) in an effort to limit this patient's exposure and mitigate transmission in our community.  Patients identity confirmed using two different identifiers.  This format is felt to be most appropriate for this patient at this time.  All issues noted in this document were discussed and addressed.  No physical exam was performed (except for noted visual exam findings with Video Visits).    Date:  10/03/2020   ID:  Anita Duncan, DOB [birth year], MRN [id number]  Patient Location:  Home   Provider location:   Office    Chief Complaint:  Cough and congestion   History of Present Illness:    Anita Duncan is a 65 y.o. female who presents via video conferencing for a telehealth visit today.   The patient  does have symptoms concerning for COVID-19 infection   The patient states that her  symptoms started Tuesday with some drainage. She has used allergy medicine which helped her. Pollen was really heavy this year so she has been using allergy medication. She does have headache. She is under a lot of stress. She does not have any fever.  About X 2 weeks ago she had a COVID test and it was negative.  She is having a some cough. She is going to come in today to get swabbed for COVID. She did not go  to work yesterday because she was not feeling good.   Sinusitis Associated symptoms include congestion, coughing and headaches. Pertinent negatives include no chills or shortness of breath.     Past Medical History:  Diagnosis Date  . Hypothyroidism    Past Surgical History:  Procedure Laterality Date  . BREAST BIOPSY Right   . BREAST BIOPSY Right   . BREAST EXCISIONAL BIOPSY Right   . BREAST LUMPECTOMY    . CESAREAN SECTION       Current Meds  Medication Sig  . azithromycin (ZITHROMAX Z-PAK) 250 MG tablet Take 2 tablets (500 mg) on  Day 1,  followed by 1 tablet (250 mg) once daily on Days 2 through 5.  . benzonatate (TESSALON PERLES) 100 MG capsule Take 1 capsule (100 mg total) by mouth 3 (three) times daily as needed for cough.     Allergies:   Amoxicillin, Codeine, and Sulfa antibiotics   Social History   Tobacco Use  . Smoking status: Current Every Day Smoker    Packs/day: 0.50    Years: 44.00    Pack years: 22.00    Types: Cigarettes    Start date: 17  . Smokeless tobacco: Former Neurosurgeon  . Tobacco comment: Encouraged to cut back on number of cigs smoked  Vaping Use  . Vaping Use: Never used  Substance Use Topics  . Alcohol use: Yes    Alcohol/week: 1.0 standard drink    Types: 1 Glasses of  wine per week    Comment: 2 x a year  . Drug use: No     Family Hx: The patient's family history includes Breast cancer (age of onset: 55) in her cousin.  ROS:   Please see the history of present illness.    Review of Systems  Constitutional: Negative for chills and fever.  HENT: Positive for congestion and sinus pain.   Respiratory: Positive for cough. Negative for shortness of breath and wheezing.   Cardiovascular: Negative for chest pain.  Musculoskeletal: Negative for myalgias.  Neurological: Positive for headaches.    All other systems reviewed and are negative.   Labs/Other Tests and Data Reviewed:    Recent Labs: 02/26/2020: Hemoglobin 12.6; Platelets  266 08/26/2020: ALT 14; BUN 20; Creatinine, Ser 0.83; Potassium 4.3; Sodium 145; TSH 0.478   Recent Lipid Panel Lab Results  Component Value Date/Time   CHOL 145 05/26/2020 08:35 AM   TRIG 40 05/26/2020 08:35 AM   HDL 75 05/26/2020 08:35 AM   CHOLHDL 1.9 05/26/2020 08:35 AM   LDLCALC 60 05/26/2020 08:35 AM    Wt Readings from Last 3 Encounters:  09/29/20 177 lb 3.2 oz (80.4 kg)  08/26/20 179 lb 9.6 oz (81.5 kg)  07/14/20 175 lb (79.4 kg)     Exam:    Vital Signs:  There were no vitals taken for this visit.    Physical Exam Vitals and nursing note reviewed.  HENT:     Head: Normocephalic and atraumatic.  Pulmonary:     Effort: Pulmonary effort is normal.  Neurological:     Mental Status: She is alert and oriented to person, place, and time.  Psychiatric:        Mood and Affect: Affect normal.     ASSESSMENT & PLAN:     1. Sinus congestion -Will go ahead treat patient due to her symptoms and have been going on for several days. -Advised patient to take antihistamines as needed for relief of allergy symptoms.  -Use nasal spray as needed such as flonase.  -She will follow up as needed if her symptoms persist. -At home COVID test was neg.  -I have advised patient to come to the office and get a POC COVID test and PCR this afternoon.  -Azithromycin (zpak) sent to pharmacy     2. Cough  - Benzonatate sent to pharmacy  -Advised patient to take OTC delsym -Advised patient to come to the office this afternoon to get POC rapid COVID test and PCR test.   Side effects and appropriate use of all the medication(s) were discussed with the patient today. Patient advised to use the medication(s) as directed by their healthcare provider. The patient was encouraged to read, review, and understand all associated package inserts and contact our office with any questions or concerns. The patient accepts the risks of the treatment plan and had an opportunity to ask questions.   The  patient was encouraged to call or send a message through MyChart for any questions or concerns.   Advised patient to take Vitamin C, D, Zinc. Keep yourself hydrated with a lot of water and rest. Take Delsym for cough and Mucinex. Take Tylenol or pain reliever every 4-6 hours as needed for pain/fever/body ache. Educated patient if symptoms get worse or if she experiences any SOB or pain in her legs to seek immediate emergency care. Continue to monitor your pulse oxygen. Call us if you have any questions. Quarantine for 5 days if tested positive and  no symptoms or 10 days if tested positive and have symptoms. Wear a mask around other people.   Patient was given opportunity to ask questions. Patient verbalized understanding of the plan and was able to repeat key elements of the plan. All questions were answered to their satisfaction.  Raman Solace Manwarren, DNP   I, Raman Dustine Stickler have reviewed all documentation for this visit. The documentation on 10/02/20 for the exam, diagnosis, procedures, and orders are all accurate and complete.     COVID-19 Education: The signs and symptoms of COVID-19 were discussed with the patient and how to seek care for testing (follow up with PCP or arrange E-visit).  The importance of social distancing was discussed today.  Patient Risk:   After full review of this patients clinical status, I feel that they are at least moderate risk at this time.  Time:   Today, I have spent 15 minutes/ seconds with the patient with telehealth technology discussing above diagnoses.     Medication Adjustments/Labs and Tests Ordered: Current medicines are reviewed at length with the patient today.  Concerns regarding medicines are outlined above.   Tests Ordered: No orders of the defined types were placed in this encounter.   Medication Changes: Meds ordered this encounter  Medications  . azithromycin (ZITHROMAX Z-PAK) 250 MG tablet    Sig: Take 2 tablets (500 mg) on  Day 1,   followed by 1 tablet (250 mg) once daily on Days 2 through 5.    Dispense:  6 each    Refill:  0  . benzonatate (TESSALON PERLES) 100 MG capsule    Sig: Take 1 capsule (100 mg total) by mouth 3 (three) times daily as needed for cough.    Dispense:  30 capsule    Refill:  1    Disposition:  Follow up if symptoms persist and do not get better.   Signed, Charlesetta Ivory, NP

## 2020-10-02 NOTE — Patient Instructions (Signed)
Sinus Headache  A sinus headache occurs when your sinuses become clogged or swollen. Sinuses are air-filled spaces in your skull that are behind the bones of your face and forehead. Sinus headaches can range from mild to severe. What are the causes? A sinus headache can result from various conditions that affect the sinuses. Common causes include:  Colds.  Sinus infections.  Allergies. Many people confuse sinus headaches with migraines or tension headaches because both headaches can cause facial pain and nasal symptoms. What are the signs or symptoms? The main symptom of this condition is a headache that may feel like pain or pressure in your face, forehead, ears, or upper teeth. People who have a sinus headache often have other symptoms, such as:  Congested or runny nose.  Fever.  Inability to smell. Weather changes can make symptoms worse. How is this diagnosed? This condition may be diagnosed based on:  A physical exam and medical history.  Imaging tests, such as a CT scan or MRI, to check for problems with the sinuses.  Examination of the sinuses using a thin tool with a camera that is inserted through your nose (endoscopy). How is this treated? Treatment for this condition depends on the cause.  Sinus pain that is caused by a sinus infection may be treated with antibiotic medicine.  Sinus pain that is caused by congestion may be helped by rinsing out (flushing) the nose and sinuses with saline solution.  Sinus pain that is caused by allergies may be helped by allergy medicines (antihistamines) and medicated nasal sprays.  Sinus surgery may be needed in some cases if other treatments do not help. Follow these instructions at home: General instructions  If directed: ? Apply a warm, moist washcloth to your face to help relieve pain. ? Use a nasal saline wash. Hydrate and humidify  Drink enough water to keep your urine clear or pale yellow. Staying hydrated will help  to thin your mucus.  Use a cool mist humidifier to keep the humidity level in your home above 50%.  Inhale steam for 10-15 minutes, 3-4 times a day or as told by your health care provider. You can do this in the bathroom while a hot shower is running.  Limit your exposure to cool or dry air. Medicines  Take over-the-counter and prescription medicines only as told by your health care provider.  If you were prescribed an antibiotic medicine, take it as told by your health care provider. Do not stop taking the antibiotic even if you start to feel better.  If you have congestion, use a nasal spray to help lessen pressure.   Contact a health care provider if:  You have a headache more than one time a week.  You have sensitivity to light or sound.  You develop a fever.  You feel nauseous or you vomit.  Your headaches do not get better with treatment. Many people think that they have a sinus headache when they actually have a migraine or a tension headache. Get help right away if:  You have vision problems.  You have sudden, severe pain in your face or head.  You have a seizure.  You are confused.  You have a stiff neck. Summary  A sinus headache occurs when your sinuses become clogged or swollen.  A sinus headache can result from various conditions that affect the sinuses, such as a cold, a sinus infection, or an allergy.  Treatment for this condition depends on the cause. It may include   medicine, such as antibiotics or antihistamines. This information is not intended to replace advice given to you by your health care provider. Make sure you discuss any questions you have with your health care provider. Document Revised: 02/12/2020 Document Reviewed: 02/12/2020 Elsevier Patient Education  2021 [employer]  

## 2020-10-02 NOTE — Telephone Encounter (Signed)
The pt scheduled a mychart visit for evaluation of sinus infection.

## 2020-10-03 ENCOUNTER — Encounter: Payer: Self-pay | Admitting: Nurse Practitioner

## 2020-10-07 ENCOUNTER — Ambulatory Visit
Admission: RE | Admit: 2020-10-07 | Discharge: 2020-10-07 | Disposition: A | Discharge status: Home / Self Care | Payer: Federal, State, Local not specified - PPO | Source: Ambulatory Visit

## 2020-10-07 ENCOUNTER — Other Ambulatory Visit: Payer: Self-pay

## 2020-10-07 VITALS — BP 129/89 | HR 92 | Temp 98.6°F | Resp 18

## 2020-10-07 DIAGNOSIS — H60502 Unspecified acute noninfective otitis externa, left ear: Secondary | ICD-10-CM

## 2020-10-07 HISTORY — DX: Other seasonal allergic rhinitis: J30.2

## 2020-10-07 MED ORDER — CIPROFLOXACIN-DEXAMETHASONE 0.3-0.1 % OT SUSP: 4.0000 [drp] | Freq: Two times a day (BID) | OTIC | 0 refills | Status: DC

## 2020-10-07 NOTE — ED Triage Notes (Signed)
Pt presents with ongoing L ear issues.  Has been treated with abx for infection.  Allergies kicked in recently causing an increase in L ear pain.  Blew her nose and felt pop in ear last week.  Started having L ear pain and drainage x 2-3 days.  Pain improved but continues having drainage.  Also reports having sinus pressure x 2 days last week.

## 2020-10-07 NOTE — ED Provider Notes (Signed)
[facility]    CSN: [id number] Arrival date & time: 10/07/20  1233      History   Chief Complaint Chief Complaint  Patient presents with  . Ear Drainage    HPI Anita Duncan is a 65 y.o. female.   Patient presents with intermittent left ear pain and drainage x2 to 3 days.  She is not currently having any ear pain but continues to have purulent drainage from her ear canal.  She denies fever, chills, or other symptoms.  Patient had a video visit with her PCP on 10/02/2020; diagnosed with sinus congestion and cough; treated with Zithromax and benzonatate.  She also has been using Ciprodex eardrops x 2 days which were previously prescribed by her ENT; she states she needs a refill on these.  Her medical history includes seasonal allergies and hypothyroidism.  The history is provided by the patient and medical records.    Past Medical History:  Diagnosis Date  . Hypothyroidism   . Seasonal allergies     Patient Active Problem List   Diagnosis Date Noted  . 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 BREAST BIOPSY Right   . BREAST BIOPSY Right   . BREAST EXCISIONAL BIOPSY Right   . BREAST LUMPECTOMY    . CESAREAN SECTION      OB History   No obstetric history on file.      Home Medications    Prior to Admission medications   Medication Sig Start Date End Date Taking? Authorizing Provider  fluticasone (FLONASE) 50 MCG/ACT nasal spray PLACE 1 SPRAY INTO BOTH NOSTRILS DAILY. 06/23/20  Yes Dorothyann Peng, MD  levocetirizine (XYZAL) 5 MG tablet TAKE 1 TABLET BY MOUTH EVERY DAY IN THE EVENING 05/05/20  Yes Dorothyann Peng, MD  rosuvastatin (CRESTOR) 10 MG tablet Take 1 tablet (10 mg total) by mouth daily. 02/25/20  Yes Jake Bathe, MD  SYNTHROID 100 MCG tablet TAKE 1 TABLET FRIDAY       THROUGH SUNDAY 06/16/20  Yes Dorothyann Peng, MD  SYNTHROID 88 MCG tablet TAKE 1 TABLET MONDAY       THROUGH THURSDAY  03/08/20  Yes Dorothyann Peng, MD  Vitamin D, Ergocalciferol, (DRISDOL) 1.25 MG (50000 UNIT) CAPS capsule TAKE 1 CAPSULE BY MOUTH ON TUESDAYS AND FRIDAYS 07/23/20  Yes Dorothyann Peng, MD  azithromycin (ZITHROMAX Z-PAK) 250 MG tablet Take 2 tablets (500 mg) on  Day 1,  followed by 1 tablet (250 mg) once daily on Days 2 through 5. 10/02/20 10/07/20  Ghumman, Ramandeep, NP  benzonatate (TESSALON PERLES) 100 MG capsule Take 1 capsule (100 mg total) by mouth 3 (three) times daily as needed for cough. 10/02/20 10/02/21  Charlesetta Ivory, NP  Chlorphen-PE-Acetaminophen 4-10-325 MG TABS Take by mouth as needed.    [provider]  ciprofloxacin-dexamethasone (CIPRODEX) OTIC suspension Place 4 drops into the left ear 2 (two) times daily. 10/07/20   Mickie Bail, NP    Family History Family History  Problem Relation Age of Onset  . Breast cancer Cousin 50    Social History Social History   Tobacco Use  . Smoking status: Former Smoker    Packs/day: 0.50    Years: 44.00    Pack years: 22.00    Types: Cigarettes    Start date: 2    Quit date: 09/02/2020    Years since quitting: 0.0  . Smokeless tobacco: Former Neurosurgeon  . Tobacco comment:  Encouraged to cut back on number of cigs smoked  Vaping Use  . Vaping Use: Never used  Substance Use Topics  . Alcohol use: Yes    Alcohol/week: 1.0 standard drink    Types: 1 Glasses of wine per week    Comment: 2 x a year  . Drug use: No     Allergies   Amoxicillin, Codeine, and Sulfa antibiotics   Review of Systems Review of Systems  Constitutional: Negative for chills and fever.  HENT: Positive for ear discharge and ear pain. Negative for sore throat.   Respiratory: Negative for cough and shortness of breath.   Cardiovascular: Negative for chest pain and palpitations.  Gastrointestinal: Negative for abdominal pain and vomiting.  Skin: Negative for color change and rash.  All other systems reviewed and are negative.    Physical  Exam Triage Vital Signs ED Triage Vitals  Enc Vitals Group     BP      Pulse      Resp      Temp      Temp src      SpO2      Weight      Height      Head Circumference      Peak Flow      Pain Score      Pain Loc      Pain Edu?      Excl. in GC?    No data found.  Updated Vital Signs BP 129/89 (BP Location: Right Arm)   Pulse 92   Temp 98.6 F (37 C) (Oral)   Resp 18   SpO2 96%   Visual Acuity Right Eye Distance:   Left Eye Distance:   Bilateral Distance:    Right Eye Near:   Left Eye Near:    Bilateral Near:     Physical Exam Vitals and nursing note reviewed.  Constitutional:      General: She is not in acute distress.    Appearance: She is well-developed. She is not ill-appearing.  HENT:     Head: Normocephalic and atraumatic.     Right Ear: Tympanic membrane and ear canal normal.     Left Ear: Tympanic membrane normal.     Nose: Nose normal.     Mouth/Throat:     Mouth: Mucous membranes are moist.     Pharynx: Oropharynx is clear.  Eyes:     Conjunctiva/sclera: Conjunctivae normal.  Cardiovascular:     Rate and Rhythm: Normal rate and regular rhythm.     Heart sounds: Normal heart sounds.  Pulmonary:     Effort: Pulmonary effort is normal. No respiratory distress.     Breath sounds: Normal breath sounds.  Abdominal:     Palpations: Abdomen is soft.     Tenderness: There is no abdominal tenderness.  Musculoskeletal:     Cervical back: Neck supple.  Skin:    General: Skin is warm and dry.  Neurological:     General: No focal deficit present.     Mental Status: She is alert and oriented to person, place, and time.     Gait: Gait normal.  Psychiatric:        Mood and Affect: Mood normal.        Behavior: Behavior normal.      UC Treatments / Results  Labs (all labs ordered are listed, but only abnormal results are displayed) Labs Reviewed - No data to display  EKG   Radiology No  results found.  Procedures Procedures (including  critical care time)  Medications Ordered in UC Medications - No data to display  Initial Impression / Assessment and Plan / UC Course  I have reviewed the triage vital signs and the nursing notes.  Pertinent labs & imaging results that were available during my care of the patient were reviewed by me and considered in my medical decision making (see chart for details).   Left otitis externa.  Patient is already using Ciprodex drops which were previously prescribed by an ENT.  She is running low on this and needs a refill.  Refill of Ciprodex provided.  Instructed patient to follow-up with her PCP or ENT if her symptoms are not improving.  She agrees to plan of care.   Final Clinical Impressions(s) / UC Diagnoses   Final diagnoses:  Acute otitis externa of left ear, unspecified type     Discharge Instructions     Use the eardrops as directed for 7 days.  Follow-up with your primary care provider or ENT if your symptoms are not improving.        ED Prescriptions    Medication Sig Dispense Auth. Provider   ciprofloxacin-dexamethasone (CIPRODEX) OTIC suspension Place 4 drops into the left ear 2 (two) times daily. 7.5 mL Mickie Bail, NP     PDMP not reviewed this encounter.   Mickie Bail, NP 10/07/20 [phone]

## 2020-10-07 NOTE — Discharge Instructions (Addendum)
Use the eardrops as directed for 7 days.  Follow-up with your primary care provider or ENT if your symptoms are not improving.

## 2020-10-29 ENCOUNTER — Other Ambulatory Visit: Payer: Self-pay | Admitting: Internal Medicine

## 2020-10-30 ENCOUNTER — Other Ambulatory Visit: Payer: Federal, State, Local not specified - PPO

## 2020-10-30 ENCOUNTER — Other Ambulatory Visit: Payer: Self-pay

## 2020-10-30 DIAGNOSIS — E039 Hypothyroidism, unspecified: Secondary | ICD-10-CM | POA: Diagnosis not present

## 2020-10-31 ENCOUNTER — Other Ambulatory Visit: Payer: Self-pay

## 2020-10-31 DIAGNOSIS — E039 Hypothyroidism, unspecified: Secondary | ICD-10-CM

## 2020-10-31 LAB — TSH: TSH: 0.498 u[IU]/mL (ref 0.450–4.500)

## 2020-11-05 ENCOUNTER — Other Ambulatory Visit (INDEPENDENT_AMBULATORY_CARE_PROVIDER_SITE_OTHER): Payer: Self-pay | Admitting: Otolaryngology

## 2020-11-05 ENCOUNTER — Other Ambulatory Visit: Payer: Self-pay

## 2020-11-05 ENCOUNTER — Ambulatory Visit (INDEPENDENT_AMBULATORY_CARE_PROVIDER_SITE_OTHER): Payer: Federal, State, Local not specified - PPO | Admitting: Otolaryngology

## 2020-11-05 DIAGNOSIS — H7292 Unspecified perforation of tympanic membrane, left ear: Secondary | ICD-10-CM | POA: Diagnosis not present

## 2020-11-05 DIAGNOSIS — H66015 Acute suppurative otitis media with spontaneous rupture of ear drum, recurrent, left ear: Secondary | ICD-10-CM | POA: Diagnosis not present

## 2020-11-05 MED ORDER — CIPROFLOXACIN-DEXAMETHASONE 0.3-0.1 % OT SUSP: 4.0000 [drp] | Freq: Two times a day (BID) | OTIC | 0 refills | Status: AC

## 2020-11-05 MED ORDER — CEFDINIR 300 MG PO CAPS: 300.0000 mg | ORAL_CAPSULE | Freq: Two times a day (BID) | ORAL | 0 refills | Status: DC

## 2020-11-05 NOTE — Progress Notes (Signed)
HPI: Anita Duncan is a 65 y.o. female who returns today for evaluation of intermittent drainage from her ears.  Sometimes they are dry and sometimes a drain.  More recently has been having drainage from the right ear.  Over the past several weeks she has had intermittent drainage from the left ear.Marland Kitchen She was recently seen in the ED on 10/07/2020 for drainage from the left ear and diagnosed with otitis externa.  She was treated with antibiotics and Ciprodex eardrops. I had seen her previously her last fall with a large posterior TM perforation.  However on the most recent visit in March of this year she has some granulation tissue posteriorly on the left TM but no obvious perforation noted. She states that the left ear intermittently drains.  Past Medical History:  Diagnosis Date  . Hypothyroidism   . Seasonal allergies    Past Surgical History:  Procedure Laterality Date  . BREAST BIOPSY Right   . BREAST BIOPSY Right   . BREAST EXCISIONAL BIOPSY Right   . BREAST LUMPECTOMY    . CESAREAN SECTION     Social History   Socioeconomic History  . Marital status: Single    Spouse name: Not on file  . Number of children: Not on file  . Years of education: Not on file  . Highest education level: Not on file  Occupational History  . Not on file  Tobacco Use  . Smoking status: Former    Packs/day: 0.50    Years: 44.00    Pack years: 22.00    Types: Cigarettes    Start date: 36    Quit date: 09/02/2020    Years since quitting: 0.1  . Smokeless tobacco: Former  . Tobacco comments:    Encouraged to cut back on number of cigs smoked  Vaping Use  . Vaping Use: Never used  Substance and Sexual Activity  . Alcohol use: Yes    Alcohol/week: 1.0 standard drink    Types: 1 Glasses of wine per week    Comment: 2 x a year  . Drug use: No  . Sexual activity: Not Currently  Other Topics Concern  . Not on file  Social History Narrative  . Not on file   Social Determinants of Health    Financial Resource Strain: Not on file  Food Insecurity: Not on file  Transportation Needs: Not on file  Physical Activity: Not on file  Stress: Not on file  Social Connections: Not on file   Family History  Problem Relation Age of Onset  . Breast cancer Cousin 31   Allergies  Allergen Reactions  . Amoxicillin   . Codeine Nausea Only  . Sulfa Antibiotics Nausea And Vomiting   Prior to Admission medications   Medication Sig Start Date End Date Taking? Authorizing Provider  benzonatate (TESSALON PERLES) 100 MG capsule Take 1 capsule (100 mg total) by mouth 3 (three) times daily as needed for cough. 10/02/20 10/02/21  Charlesetta Ivory, NP  Chlorphen-PE-Acetaminophen 4-10-325 MG TABS Take by mouth as needed.    [provider]  ciprofloxacin-dexamethasone (CIPRODEX) OTIC suspension Place 4 drops into the left ear 2 (two) times daily. 10/07/20   Mickie Bail, NP  fluticasone (FLONASE) 50 MCG/ACT nasal spray PLACE 1 SPRAY INTO BOTH NOSTRILS DAILY. 06/23/20   Dorothyann Peng, MD  levocetirizine (XYZAL) 5 MG tablet TAKE 1 TABLET BY MOUTH EVERY DAY IN THE EVENING 05/05/20   Dorothyann Peng, MD  rosuvastatin (CRESTOR) 10 MG tablet Take  1 tablet (10 mg total) by mouth daily. 02/25/20   Jake Bathe, MD  SYNTHROID 100 MCG tablet TAKE 1 TABLET FRIDAY       THROUGH SUNDAY Patient taking differently: Sunday only 06/16/20   Dorothyann Peng, MD  SYNTHROID 88 MCG tablet TAKE 1 TABLET MONDAY       THROUGH THURSDAY Patient taking differently: Monday - Saturday 03/08/20   Dorothyann Peng, MD  Vitamin D, Ergocalciferol, (DRISDOL) 1.25 MG (50000 UNIT) CAPS capsule TAKE 1 CAPSULE BY MOUTH ON TUESDAYS AND FRIDAYS 10/29/20   Dorothyann Peng, MD     Positive ROS: Otherwise negative  All other systems have been reviewed and were otherwise negative with the exception of those mentioned in the HPI and as above.  Physical Exam: Constitutional: Alert, well-appearing, no acute distress Ears: External  ears without lesions or tenderness.  Right ear canal with mild inflammatory changes and some debris that was cleaned with suction.  After cleaning the right ear canal I applied gentian violet and CSF powder to the right ear canal only.  On the left side she has drainage purulent discharge that was suctioned and a moderate size posterior left TM perforation with some granulation tissue around the edges of the perforation.  She had mucoid fluid suctioned from the middle ear space.  After cleaning the ear canal I applied CSF powder to the left ear canal only. Nasal: External nose without lesions. Septum with minimal deformity and mild rhinitis.  The middle meatus regions were clear with no obvious mucopurulent discharge noted..  Oral: Lips and gums without lesions. Tongue and palate mucosa without lesions. Posterior oropharynx clear. Neck: No palpable adenopathy or masses Respiratory: Breathing comfortably  Skin: No facial/neck lesions or rash noted.  Cerumen impaction removal  Date/Time: 11/05/2020 12:28 PM Performed by: Drema Halon, MD Authorized by: Drema Halon, MD   Consent:    Consent obtained:  Verbal   Consent given by:  Patient Procedure details:    Location:  L ear and R ear   Procedure type: suction   Comments:     Both ear canals were cleaned with suction.  On the right side she had minimal inflammatory changes of the ear canal and I applied gentian violet and CSF powder to the right ear canal.  The left ear canal had drainage and a posterior left TM perforation with some granulation tissues at the periphery of the perforation with middle ear purulent discharge.  After cleaning the left ear I applied CSF HC powder to the left ear only along with Ciprodex drops.  Assessment: Posterior left TM perforation with otitis media. History of otitis externa.  Plan: Prescribed Omnicef 300 mg twice daily for 10 days as she does not tolerate Augmentin well. Also recommended  use of the Ciprodex eardrops 4 drops twice daily in the left ear for the next week and refill this. Recommended keeping all water out of the left ear. She will follow-up in 1 month for recheck.   Narda Bonds, MD

## 2020-11-27 ENCOUNTER — Encounter: Payer: Self-pay | Admitting: Internal Medicine

## 2020-12-08 ENCOUNTER — Encounter: Payer: Self-pay | Admitting: Internal Medicine

## 2020-12-08 ENCOUNTER — Other Ambulatory Visit: Payer: Self-pay | Admitting: Internal Medicine

## 2020-12-08 ENCOUNTER — Ambulatory Visit (INDEPENDENT_AMBULATORY_CARE_PROVIDER_SITE_OTHER): Payer: Federal, State, Local not specified - PPO | Admitting: Otolaryngology

## 2020-12-08 ENCOUNTER — Other Ambulatory Visit: Payer: Self-pay

## 2020-12-08 MED ORDER — LEVOTHYROXINE SODIUM 88 MCG PO TABS: ORAL_TABLET | ORAL | 2 refills | Status: DC

## 2020-12-08 MED ORDER — LEVOTHYROXINE SODIUM 100 MCG PO TABS: ORAL_TABLET | ORAL | 1 refills | Status: DC

## 2020-12-10 ENCOUNTER — Other Ambulatory Visit: Payer: Self-pay

## 2020-12-10 MED ORDER — LEVOTHYROXINE SODIUM 88 MCG PO TABS: ORAL_TABLET | ORAL | 2 refills | Status: DC

## 2020-12-10 MED ORDER — LEVOTHYROXINE SODIUM 100 MCG PO TABS
ORAL_TABLET | ORAL | 1 refills | Status: DC
Start: 2020-12-10 — End: 2022-10-04

## 2020-12-15 ENCOUNTER — Other Ambulatory Visit: Payer: Self-pay

## 2020-12-15 ENCOUNTER — Ambulatory Visit (INDEPENDENT_AMBULATORY_CARE_PROVIDER_SITE_OTHER): Payer: Federal, State, Local not specified - PPO | Admitting: Otolaryngology

## 2020-12-15 DIAGNOSIS — H6122 Impacted cerumen, left ear: Secondary | ICD-10-CM

## 2020-12-15 DIAGNOSIS — H60312 Diffuse otitis externa, left ear: Secondary | ICD-10-CM

## 2020-12-15 DIAGNOSIS — H7292 Unspecified perforation of tympanic membrane, left ear: Secondary | ICD-10-CM

## 2020-12-15 NOTE — Progress Notes (Signed)
HPI: Anita Duncan is a 65 y.o. female who returns today for evaluation of external otitis as well as left TM perforation with drainage.  She has not tolerated Augmentin in the past and was treated with [facility] which she took for about a week.  She is also used eardrops just in the left ear which has been draining intermittently but is doing better.  The right ear is much better..  Past Medical History:  Diagnosis Date   Hypothyroidism    Seasonal allergies    Past Surgical History:  Procedure Laterality Date   BREAST BIOPSY Right    BREAST BIOPSY Right    BREAST EXCISIONAL BIOPSY Right    BREAST LUMPECTOMY     CESAREAN SECTION     Social History   Socioeconomic History   Marital status: Single    Spouse name: Not on file   Number of children: Not on file   Years of education: Not on file   Highest education level: Not on file  Occupational History   Not on file  Tobacco Use   Smoking status: Former    Packs/day: 0.50    Years: 44.00    Pack years: 22.00    Types: Cigarettes    Start date: 39    Quit date: 09/02/2020    Years since quitting: 0.2   Smokeless tobacco: Former   Tobacco comments:    Encouraged to cut back on number of cigs smoked  Vaping Use   Vaping Use: Never used  Substance and Sexual Activity   Alcohol use: Yes    Alcohol/week: 1.0 standard drink    Types: 1 Glasses of wine per week    Comment: 2 x a year   Drug use: No   Sexual activity: Not Currently  Other Topics Concern   Not on file  Social History Narrative   Not on file   Social Determinants of Health   Financial Resource Strain: Not on file  Food Insecurity: Not on file  Transportation Needs: Not on file  Physical Activity: Not on file  Stress: Not on file  Social Connections: Not on file   Family History  Problem Relation Age of Onset   Breast cancer Cousin 31   Allergies  Allergen Reactions   Amoxicillin    Codeine Nausea Only   Sulfa Antibiotics Nausea And Vomiting    Prior to Admission medications   Medication Sig Start Date End Date Taking? Authorizing Provider  benzonatate (TESSALON PERLES) 100 MG capsule Take 1 capsule (100 mg total) by mouth 3 (three) times daily as needed for cough. 10/02/20 10/02/21  Charlesetta Ivory, NP  cefdinir (OMNICEF) 300 MG capsule Take 1 capsule (300 mg total) by mouth 2 (two) times daily. 11/05/20   Drema Halon, MD  Chlorphen-PE-Acetaminophen 4-10-325 MG TABS Take by mouth as needed.    [provider]  ciprofloxacin-dexamethasone (CIPRODEX) OTIC suspension Place 4 drops into the left ear 2 (two) times daily. 10/07/20   Mickie Bail, NP  fluticasone (FLONASE) 50 MCG/ACT nasal spray PLACE 1 SPRAY INTO BOTH NOSTRILS DAILY. 06/23/20   Dorothyann Peng, MD  levocetirizine (XYZAL) 5 MG tablet TAKE 1 TABLET BY MOUTH EVERY DAY IN THE EVENING 05/05/20   Dorothyann Peng, MD  levothyroxine (SYNTHROID) 100 MCG tablet Take one by tablet by mouth on Sundays 12/10/20   Dorothyann Peng, MD  levothyroxine (SYNTHROID) 88 MCG tablet Take one tablet by mouth daily Monday through saturday 12/10/20   Dorothyann Peng, MD  rosuvastatin (CRESTOR) 10 MG tablet Take 1 tablet (10 mg total) by mouth daily. 02/25/20   Jake Bathe, MD  Vitamin D, Ergocalciferol, (DRISDOL) 1.25 MG (50000 UNIT) CAPS capsule TAKE 1 CAPSULE BY MOUTH ON TUESDAYS AND FRIDAYS 10/29/20   Dorothyann Peng, MD     Positive ROS: Otherwise negative  All other systems have been reviewed and were otherwise negative with the exception of those mentioned in the HPI and as above.  Physical Exam: Constitutional: Alert, well-appearing, no acute distress Ears: External ears without lesions or tenderness.  Right ear canal and right TM are clear.  Left ear canal reveals some drainage with a posterior TM perforation and some fungal elements within the ear canal that was cleaned with suction.  After cleaning the left ear canal and left TM perforation I applied gentian violet,  Ciprodex and CSF powder to the left ear only. Nasal: External nose without lesions. Septum with minimal deformity.  Both middle meatus regions are clear with no signs of infection.. Clear nasal passages otherwise. Oral: Lips and gums without lesions. Tongue and palate mucosa without lesions. Posterior oropharynx clear.  No signs of infection in oropharynx. Neck: No palpable adenopathy or masses Respiratory: Breathing comfortably  Skin: No facial/neck lesions or rash noted.  Cerumen impaction removal  Date/Time: 12/15/2020 1:12 PM Performed by: Drema Halon, MD Authorized by: Drema Halon, MD   Consent:    Consent obtained:  Verbal   Consent given by:  Patient   Risks discussed:  Pain and bleeding Procedure details:    Location:  L ear   Procedure type: suction   Post-procedure details:    Inspection:  TM intact and canal normal   Hearing quality:  Improved   Procedure completion:  Tolerated well, no immediate complications Comments:     Left ear canal was cleaned with suction.  She has a posterior superior TM perforation with some fungal elements.  After cleaning the ear canal I applied gentian violet, Ciprodex and CSF powder.  Assessment: Persistent left ear canal fungal external otitis with posterior left TM perforation.  Clear right ear canal and TM.  Plan: Ear canal was cleaned in the office today and applied gentian violet, Ciprodex and CSF powder to the left ear.  Recommend keeping it dry.  Use the antibiotic eardrops in 3 days if she has any persistent drainage and use remaining Omnicef if she has any persistent drainage.  She will follow-up in 2 and half weeks for recheck.   Narda Bonds, MD

## 2020-12-29 ENCOUNTER — Ambulatory Visit (INDEPENDENT_AMBULATORY_CARE_PROVIDER_SITE_OTHER): Admitting: Physician Assistant

## 2020-12-29 ENCOUNTER — Other Ambulatory Visit: Payer: Self-pay

## 2020-12-29 ENCOUNTER — Encounter: Payer: Self-pay | Admitting: Orthopedic Surgery

## 2020-12-29 DIAGNOSIS — M7541 Impingement syndrome of right shoulder: Secondary | ICD-10-CM | POA: Diagnosis not present

## 2020-12-29 NOTE — Progress Notes (Signed)
Office Visit Note   Patient: Anita Duncan           Date of Birth: [birth year]           MRN: [id number] Visit Date: 12/29/2020              Requested by: Dorothyann Peng, MD [address] [place],  [place] [address] PCP: Dorothyann Peng, MD  Chief Complaint  Patient presents with   Right Shoulder - Follow-up      HPI: Patient presents in follow-up for her right shoulder.  This was a work-related injury that she sustained working at the [employer].  She does have work restrictions and needs them updated here today.  She says that she has some significant increase in symptoms when she was at work reaching directly behind her to get something.  This was quite sore.  That being said she thinks she should stay with her current restrictions does not request anything further  Assessment & Plan: Visit Diagnoses: No diagnosis found.  Plan: Follow-up as needed we will plan on continuing current work restrictions  Follow-Up Instructions: No follow-ups on file.   Ortho Exam  Patient is alert, oriented, no adenopathy, well-dressed, normal affect, normal respiratory effort. Examination of right shoulder she has significant pain with abduction and forward elevation.  She has pain with reaching behind her back.  She has less motion can only go to the belt line on the right behind her back versus mid back on the left.  She has positive impingement findings which recreate her painful symptoms  Imaging: No results found. No images are attached to the encounter.  Labs: Lab Results  Component Value Date   HGBA1C 6.2 (H) 08/26/2020   HGBA1C 6.1 (H) 02/26/2020   HGBA1C 5.9 (H) 08/21/2019   ESRSEDRATE 22 01/12/2011     Lab Results  Component Value Date   ALBUMIN 4.4 08/26/2020   ALBUMIN 4.6 02/26/2020   ALBUMIN 4.3 02/20/2019    No results found for: MG Lab Results  Component Value Date   VD25OH 18.0 (L) 02/26/2020    No results found for: PREALBUMIN CBC EXTENDED  Latest Ref Rng & Units 02/26/2020 02/20/2019 02/13/2018  WBC 3.4 - 10.8 x10E3/uL 7.6 6.7 6.5  RBC 3.77 - 5.28 x10E6/uL 4.29 4.11 4.34  HGB 11.1 - 15.9 g/dL 19.1 47.8 29.5  HCT 62.1 - 46.6 % 38.1 35.3 37.8  PLT 150 - 450 x10E3/uL 266 238 272  NEUTROABS 1.7 - 7.7 K/uL - - -  LYMPHSABS 0.7 - 4.0 K/uL - - -     There is no height or weight on file to calculate BMI.  Orders:  No orders of the defined types were placed in this encounter.  No orders of the defined types were placed in this encounter.    Procedures: No procedures performed  Clinical Data: No additional findings.  ROS:  All other systems negative, except as noted in the HPI. Review of Systems  Objective: Vital Signs: There were no vitals taken for this visit.  Specialty Comments:  No specialty comments available.  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   Past Medical History:  Diagnosis Date   Hypothyroidism    Seasonal allergies     Family History  Problem Relation Age of Onset   Breast cancer Cousin 80    Past Surgical History:  Procedure Laterality Date   BREAST BIOPSY  Right    BREAST BIOPSY Right    BREAST EXCISIONAL BIOPSY Right    BREAST LUMPECTOMY     CESAREAN SECTION     Social History   Occupational History   Not on file  Tobacco Use   Smoking status: Former    Packs/day: 0.50    Years: 44.00    Pack years: 22.00    Types: Cigarettes    Start date: 29    Quit date: 09/02/2020    Years since quitting: 0.3   Smokeless tobacco: Former   Tobacco comments:    Encouraged to cut back on number of cigs smoked  Vaping Use   Vaping Use: Never used  Substance and Sexual Activity   Alcohol use: Yes    Alcohol/week: 1.0 standard drink    Types: 1 Glasses of wine per week    Comment: 2 x a year   Drug use: No   Sexual activity: Not Currently

## 2020-12-31 ENCOUNTER — Ambulatory Visit (INDEPENDENT_AMBULATORY_CARE_PROVIDER_SITE_OTHER): Payer: Federal, State, Local not specified - PPO | Admitting: Otolaryngology

## 2021-01-05 ENCOUNTER — Other Ambulatory Visit: Payer: Federal, State, Local not specified - PPO

## 2021-01-05 ENCOUNTER — Other Ambulatory Visit: Payer: Self-pay

## 2021-01-05 ENCOUNTER — Ambulatory Visit (INDEPENDENT_AMBULATORY_CARE_PROVIDER_SITE_OTHER): Payer: Federal, State, Local not specified - PPO | Admitting: Otolaryngology

## 2021-01-05 DIAGNOSIS — H6123 Impacted cerumen, bilateral: Secondary | ICD-10-CM

## 2021-01-05 DIAGNOSIS — E039 Hypothyroidism, unspecified: Secondary | ICD-10-CM | POA: Diagnosis not present

## 2021-01-05 DIAGNOSIS — H7292 Unspecified perforation of tympanic membrane, left ear: Secondary | ICD-10-CM | POA: Diagnosis not present

## 2021-01-05 DIAGNOSIS — H60313 Diffuse otitis externa, bilateral: Secondary | ICD-10-CM

## 2021-01-05 MED ORDER — CIPROFLOXACIN-DEXAMETHASONE 0.3-0.1 % OT SUSP: 4.0000 [drp] | Freq: Two times a day (BID) | OTIC | 1 refills | Status: DC

## 2021-01-05 NOTE — Progress Notes (Signed)
HPI: Anita Duncan is a 65 y.o. female who returns today for evaluation of fungal external otitis with left TM perforation.  She is doing better but still having a little bit of drainage from the ear.  She has history of allergies and is taking allergy medication and feels like this helps..  Past Medical History:  Diagnosis Date   Hypothyroidism    Seasonal allergies    Past Surgical History:  Procedure Laterality Date   BREAST BIOPSY Right    BREAST BIOPSY Right    BREAST EXCISIONAL BIOPSY Right    BREAST LUMPECTOMY     CESAREAN SECTION     Social History   Socioeconomic History   Marital status: Single    Spouse name: Not on file   Number of children: Not on file   Years of education: Not on file   Highest education level: Not on file  Occupational History   Not on file  Tobacco Use   Smoking status: Former    Packs/day: 0.50    Years: 44.00    Pack years: 22.00    Types: Cigarettes    Start date: 75    Quit date: 09/02/2020    Years since quitting: 0.3   Smokeless tobacco: Former   Tobacco comments:    Encouraged to cut back on number of cigs smoked  Vaping Use   Vaping Use: Never used  Substance and Sexual Activity   Alcohol use: Yes    Alcohol/week: 1.0 standard drink    Types: 1 Glasses of wine per week    Comment: 2 x a year   Drug use: No   Sexual activity: Not Currently  Other Topics Concern   Not on file  Social History Narrative   Not on file   Social Determinants of Health   Financial Resource Strain: Not on file  Food Insecurity: Not on file  Transportation Needs: Not on file  Physical Activity: Not on file  Stress: Not on file  Social Connections: Not on file   Family History  Problem Relation Age of Onset   Breast cancer Cousin 31   Allergies  Allergen Reactions   Amoxicillin    Codeine Nausea Only   Sulfa Antibiotics Nausea And Vomiting   Prior to Admission medications   Medication Sig Start Date End Date Taking? Authorizing  Provider  benzonatate (TESSALON PERLES) 100 MG capsule Take 1 capsule (100 mg total) by mouth 3 (three) times daily as needed for cough. 10/02/20 10/02/21  Charlesetta Ivory, NP  cefdinir (OMNICEF) 300 MG capsule Take 1 capsule (300 mg total) by mouth 2 (two) times daily. 11/05/20   Drema Halon, MD  Chlorphen-PE-Acetaminophen 4-10-325 MG TABS Take by mouth as needed.    [provider]  ciprofloxacin-dexamethasone (CIPRODEX) OTIC suspension Place 4 drops into the left ear 2 (two) times daily. 10/07/20   Mickie Bail, NP  fluticasone (FLONASE) 50 MCG/ACT nasal spray PLACE 1 SPRAY INTO BOTH NOSTRILS DAILY. 06/23/20   Dorothyann Peng, MD  levocetirizine (XYZAL) 5 MG tablet TAKE 1 TABLET BY MOUTH EVERY DAY IN THE EVENING 05/05/20   Dorothyann Peng, MD  levothyroxine (SYNTHROID) 100 MCG tablet Take one by tablet by mouth on Sundays 12/10/20   Dorothyann Peng, MD  levothyroxine (SYNTHROID) 88 MCG tablet Take one tablet by mouth daily Monday through saturday 12/10/20   Dorothyann Peng, MD  rosuvastatin (CRESTOR) 10 MG tablet Take 1 tablet (10 mg total) by mouth daily. 02/25/20   Donato Schultz  C, MD  Vitamin D, Ergocalciferol, (DRISDOL) 1.25 MG (50000 UNIT) CAPS capsule TAKE 1 CAPSULE BY MOUTH ON TUESDAYS AND FRIDAYS 10/29/20   Dorothyann Peng, MD     Positive ROS: Otherwise negative  All other systems have been reviewed and were otherwise negative with the exception of those mentioned in the HPI and as above.  Physical Exam: Constitutional: Alert, well-appearing, no acute distress Ears: External ears without lesions or tenderness.  She had a small amount of debris within the right ear canal that was cleaned in the office with suction.  The TM was intact.  After cleaning the right ear canal I applied gentian violet Ciprodex and CSF powder to the right ear canal.  On the left side she had a small amount of drainage that was cleaned with suction again noted is a posterior superior TM perforation.   After cleaning the ear canal I again applied gentian violet, Ciprodex and CSF powder to the left ear also. Nasal: External nose without lesions. Septum with mild deformity and mild rhinitis both middle meatus regions were clear with no signs of infection..  Oral: Lips and gums without lesions. Tongue and palate mucosa without lesions. Posterior oropharynx clear. Neck: No palpable adenopathy or masses Respiratory: Breathing comfortably  Skin: No facial/neck lesions or rash noted.  Cerumen impaction removal  Date/Time: 01/05/2021 4:18 PM Performed by: Drema Halon, MD Authorized by: Drema Halon, MD   Consent:    Consent obtained:  Verbal   Consent given by:  Patient   Risks discussed:  Pain and bleeding Procedure details:    Location:  L ear and R ear   Procedure type: suction   Post-procedure details:    Inspection:  TM intact and canal normal   Hearing quality:  Improved   Procedure completion:  Tolerated well, no immediate complications Comments:     Ear canals were cleaned bilaterally with suction.  She had mild inflammatory changes of both ear canals with a left TM perforation.  After cleaning the ear canals I applied gentian violet, Ciprodex and CSF powder to both ear canals.  Assessment: Mild bilateral external otitis with left posterior TM perforation  Plan: Recommended keeping the ear canals dry and to use the antibiotic eardrops Ciprodex if she has any drainage from the ears. She will follow-up in 3 weeks for recheck.   Narda Bonds, MD

## 2021-01-06 LAB — TSH: TSH: 1.22 u[IU]/mL (ref 0.450–4.500)

## 2021-01-26 ENCOUNTER — Other Ambulatory Visit: Payer: Self-pay

## 2021-01-26 ENCOUNTER — Encounter (HOSPITAL_BASED_OUTPATIENT_CLINIC_OR_DEPARTMENT_OTHER): Payer: Self-pay | Admitting: *Deleted

## 2021-01-26 ENCOUNTER — Emergency Department (HOSPITAL_BASED_OUTPATIENT_CLINIC_OR_DEPARTMENT_OTHER): Payer: Federal, State, Local not specified - PPO

## 2021-01-26 ENCOUNTER — Emergency Department (HOSPITAL_BASED_OUTPATIENT_CLINIC_OR_DEPARTMENT_OTHER)
Admission: EM | Admit: 2021-01-26 | Discharge: 2021-01-27 | Disposition: A | Discharge status: Home / Self Care | Payer: Federal, State, Local not specified - PPO | Attending: Emergency Medicine | Admitting: Emergency Medicine

## 2021-01-26 DIAGNOSIS — E039 Hypothyroidism, unspecified: Secondary | ICD-10-CM | POA: Insufficient documentation

## 2021-01-26 DIAGNOSIS — Z79899 Other long term (current) drug therapy: Secondary | ICD-10-CM | POA: Diagnosis not present

## 2021-01-26 DIAGNOSIS — R059 Cough, unspecified: Secondary | ICD-10-CM

## 2021-01-26 DIAGNOSIS — Z87891 Personal history of nicotine dependence: Secondary | ICD-10-CM | POA: Insufficient documentation

## 2021-01-26 MED ORDER — ALBUTEROL SULFATE HFA 108 (90 BASE) MCG/ACT IN AERS
2.0000 | INHALATION_SPRAY | Freq: Once | RESPIRATORY_TRACT | Status: AC
  Administered 2021-01-27: 2 via RESPIRATORY_TRACT
  Filled 2021-01-26: qty 6.7

## 2021-01-26 NOTE — ED Notes (Signed)
Intermittent cough for several months.

## 2021-01-26 NOTE — ED Triage Notes (Signed)
C/o cough x months

## 2021-01-26 NOTE — ED Provider Notes (Signed)
[facility] EMERGENCY DEPARTMENT Provider Note   CSN: [id number] Arrival date & time: 01/26/21  2108     History Chief Complaint  Patient presents with   Cough    Anita Duncan is a 65 y.o. female.  The history is provided by the patient.  Cough She has history of seasonal allergies and comes in because of coughing spasm today.  She stopped smoking about 5 months ago (was half pack a day smoker), and has had a nonproductive cough for the last 3 months.  She has been taking Xyzal for seasonal allergies.  She denies fever, chills, sweats.  She denies any dyspnea.  She has had some posttussive emesis, but no nausea.   Past Medical History:  Diagnosis Date   Hypothyroidism    Seasonal allergies     Patient Active Problem List   Diagnosis Date Noted   Impingement syndrome of right shoulder 06/21/2016    Past Surgical History:  Procedure Laterality Date   BREAST BIOPSY Right    BREAST BIOPSY Right    BREAST EXCISIONAL BIOPSY Right    BREAST LUMPECTOMY     CESAREAN SECTION       OB History   No obstetric history on file.     Family History  Problem Relation Age of Onset   Breast cancer Cousin 15    Social History   Tobacco Use   Smoking status: Former    Packs/day: 0.50    Years: 44.00    Pack years: 22.00    Types: Cigarettes    Start date: 37    Quit date: 09/02/2020    Years since quitting: 0.4   Smokeless tobacco: Former   Tobacco comments:    Encouraged to cut back on number of cigs smoked  Vaping Use   Vaping Use: Never used  Substance Use Topics   Alcohol use: Yes    Alcohol/week: 1.0 standard drink    Types: 1 Glasses of wine per week    Comment: 2 x a year   Drug use: No    Home Medications Prior to Admission medications   Medication Sig Start Date End Date Taking? Authorizing Provider  benzonatate (TESSALON PERLES) 100 MG capsule Take 1 capsule (100 mg total) by mouth 3 (three) times daily as needed for cough. 10/02/20  10/02/21  Charlesetta Ivory, NP  cefdinir (OMNICEF) 300 MG capsule Take 1 capsule (300 mg total) by mouth 2 (two) times daily. 11/05/20   Drema Halon, MD  Chlorphen-PE-Acetaminophen 4-10-325 MG TABS Take by mouth as needed.    [provider]  ciprofloxacin-dexamethasone (CIPRODEX) OTIC suspension Place 4 drops into the left ear 2 (two) times daily. 10/07/20   Mickie Bail, NP  ciprofloxacin-dexamethasone (CIPRODEX) OTIC suspension Place 4 drops into the left ear 2 (two) times daily. 4 drops in the left ear twice daily x5 days as needed any drainage from the left ear 01/05/21   Drema Halon, MD  fluticasone (FLONASE) 50 MCG/ACT nasal spray PLACE 1 SPRAY INTO BOTH NOSTRILS DAILY. 06/23/20   Dorothyann Peng, MD  levocetirizine (XYZAL) 5 MG tablet TAKE 1 TABLET BY MOUTH EVERY DAY IN THE EVENING 05/05/20   Dorothyann Peng, MD  levothyroxine (SYNTHROID) 100 MCG tablet Take one by tablet by mouth on Sundays 12/10/20   Dorothyann Peng, MD  levothyroxine (SYNTHROID) 88 MCG tablet Take one tablet by mouth daily Monday through saturday 12/10/20   Dorothyann Peng, MD  rosuvastatin (CRESTOR) 10 MG tablet Take 1  tablet (10 mg total) by mouth daily. 02/25/20   Jake Bathe, MD  Vitamin D, Ergocalciferol, (DRISDOL) 1.25 MG (50000 UNIT) CAPS capsule TAKE 1 CAPSULE BY MOUTH ON TUESDAYS AND FRIDAYS 10/29/20   Dorothyann Peng, MD    Allergies    Amoxicillin, Codeine, and Sulfa antibiotics  Review of Systems   Review of Systems  Respiratory:  Positive for cough.   All other systems reviewed and are negative.  Physical Exam Updated Vital Signs BP (!) 166/106 (BP Location: Right Arm)   Pulse (!) 106   Temp 98.3 F (36.8 C) (Oral)   Resp 16   Ht 5\' 2"  (1.575 m)   Wt 79.4 kg   SpO2 97%   BMI 32.01 kg/m   Physical Exam Vitals and nursing note reviewed.  65 year old female, resting comfortably and in no acute distress. Vital signs are significant for elevated blood pressure and mildly  elevated heart rate. Oxygen saturation is 97%, which is normal. Head is normocephalic and atraumatic. PERRLA, EOMI. Oropharynx is clear. Neck is nontender and supple without adenopathy or JVD. Back is nontender and there is no CVA tenderness. Lungs are clear without rales, wheezes, or rhonchi.  No significant prolongation of exhalation phase. Chest is nontender. Heart has regular rate and rhythm without murmur. Abdomen is soft, flat, nontender without masses or hepatosplenomegaly and peristalsis is normoactive. Extremities have no cyanosis or edema, full range of motion is present. Skin is warm and dry without rash. Neurologic: Mental status is normal, cranial nerves are intact, there are no motor or sensory deficits.  ED Results / Procedures / Treatments    Radiology DG Chest 2 View  Result Date: 01/26/2021 CLINICAL DATA:  Cough. EXAM: CHEST - 2 VIEW COMPARISON:  Chest x-ray 10/04/2006. FINDINGS: The heart size and mediastinal contours are within normal limits. Both lungs are clear. The visualized skeletal structures are unremarkable. IMPRESSION: No active cardiopulmonary disease. Electronically Signed   By: 10/06/2006 M.D.   On: 01/26/2021 21:47    Procedures Procedures   Medications Ordered in ED Medications  predniSONE (DELTASONE) tablet 60 mg (has no administration in time range)  albuterol (VENTOLIN HFA) 108 (90 Base) MCG/ACT inhaler 2 puff (2 puffs Inhalation Given 01/27/21 0002)    ED Course  I have reviewed the triage vital signs and the nursing notes.  Pertinent imaging results that were available during my care of the patient were reviewed by me and considered in my medical decision making (see chart for details).   MDM Rules/Calculators/A&P                         Cough with a coughing paroxysm.  Chest x-ray shows no evidence of pneumonia.  Although no obvious bronchospasm on exam, I wonder if there is some component of bronchospasm behind the cough.  She will be  given a therapeutic trial of albuterol inhaler.  Old records are reviewed, and she has no relevant past visits.  Of note, patient is wondering whether she should see a pulmonologist about her cough.  She will be given ambulatory referral to pulmonary clinic.  She noted significant improvement with albuterol.  She is given a dose of prednisone and is advised to continue using the inhaler as needed.  She is discharged with a short course of prednisone and is referred to pulmonology for outpatient evaluation.  Return precautions discussed.  Final Clinical Impression(s) / ED Diagnoses Final diagnoses:  Cough  Rx / DC Orders ED Discharge Orders          Ordered    predniSONE (DELTASONE) 50 MG tablet  Daily        01/27/21 0025    Ambulatory referral to Pulmonology        01/27/21 0025    albuterol (VENTOLIN HFA) 108 (90 Base) MCG/ACT inhaler  Every 4 hours PRN        01/27/21 0028    Ambulatory referral to Pulmonology  Status:  Canceled        01/26/21 2343             Dione Booze, MD 01/27/21 0030

## 2021-01-27 ENCOUNTER — Telehealth: Payer: Self-pay

## 2021-01-27 MED ORDER — PREDNISONE 50 MG PO TABS
60.0000 mg | ORAL_TABLET | Freq: Once | ORAL | Status: AC
  Administered 2021-01-27: 60 mg via ORAL
  Filled 2021-01-27: qty 1

## 2021-01-27 MED ORDER — ALBUTEROL SULFATE HFA 108 (90 BASE) MCG/ACT IN AERS: 2.0000 | INHALATION_SPRAY | RESPIRATORY_TRACT | 0 refills | Status: DC | PRN

## 2021-01-27 MED ORDER — PREDNISONE 50 MG PO TABS: 50.0000 mg | ORAL_TABLET | Freq: Every day | ORAL | 0 refills | Status: DC

## 2021-01-27 NOTE — Telephone Encounter (Signed)
Spoke with pt about ED visit. She stated she would like to try out the prednisone sent to pharmacy by ED before making an apt to f/u. She feels if it does not work, she would like to be referred to an allergist wanting to know what triggers the cough. Overall, pt states she is doing better.

## 2021-01-27 NOTE — Discharge Instructions (Addendum)
Use the albuterol inhaler 2 puffs at a time, every 4 hours as needed to help with your cough.  Please follow-up with the lung specialist for further evaluation of your cough.

## 2021-02-06 ENCOUNTER — Other Ambulatory Visit: Payer: Self-pay | Admitting: Internal Medicine

## 2021-02-09 DIAGNOSIS — Z01411 Encounter for gynecological examination (general) (routine) with abnormal findings: Secondary | ICD-10-CM | POA: Diagnosis not present

## 2021-02-09 DIAGNOSIS — Z113 Encounter for screening for infections with a predominantly sexual mode of transmission: Secondary | ICD-10-CM | POA: Diagnosis not present

## 2021-02-09 DIAGNOSIS — Z6834 Body mass index (BMI) 34.0-34.9, adult: Secondary | ICD-10-CM | POA: Diagnosis not present

## 2021-02-09 DIAGNOSIS — Z124 Encounter for screening for malignant neoplasm of cervix: Secondary | ICD-10-CM | POA: Diagnosis not present

## 2021-02-09 DIAGNOSIS — Z01419 Encounter for gynecological examination (general) (routine) without abnormal findings: Secondary | ICD-10-CM | POA: Diagnosis not present

## 2021-02-19 ENCOUNTER — Other Ambulatory Visit: Payer: Self-pay

## 2021-02-19 ENCOUNTER — Ambulatory Visit (INDEPENDENT_AMBULATORY_CARE_PROVIDER_SITE_OTHER): Payer: Federal, State, Local not specified - PPO | Admitting: Otolaryngology

## 2021-02-19 DIAGNOSIS — H65112 Acute and subacute allergic otitis media (mucoid) (sanguinous) (serous), left ear: Secondary | ICD-10-CM

## 2021-02-19 DIAGNOSIS — H60311 Diffuse otitis externa, right ear: Secondary | ICD-10-CM

## 2021-02-19 DIAGNOSIS — H7292 Unspecified perforation of tympanic membrane, left ear: Secondary | ICD-10-CM | POA: Diagnosis not present

## 2021-02-19 MED ORDER — CEFUROXIME AXETIL 250 MG PO TABS: 250.0000 mg | ORAL_TABLET | Freq: Two times a day (BID) | ORAL | 0 refills | Status: AC

## 2021-02-19 MED ORDER — CIPROFLOXACIN-DEXAMETHASONE 0.3-0.1 % OT SUSP: 4.0000 [drp] | Freq: Two times a day (BID) | OTIC | 0 refills | Status: DC

## 2021-02-19 NOTE — Progress Notes (Signed)
HPI: Anita Duncan is a 65 y.o. female who returns today for evaluation of ear problems.  She was last seen 6 weeks ago with cerumen impactions as well as mild external otitis and a left TM perforation which she does not know how she got.  She has done well up until the past week when her allergies kicked in and she started having some drainage from the left ear.  The right ear is doing reasonly well.. She has eardrops and is starting to use eardrops in the left ear when it started draining.  Past Medical History:  Diagnosis Date   Hypothyroidism    Seasonal allergies    Past Surgical History:  Procedure Laterality Date   BREAST BIOPSY Right    BREAST BIOPSY Right    BREAST EXCISIONAL BIOPSY Right    BREAST LUMPECTOMY     CESAREAN SECTION     Social History   Socioeconomic History   Marital status: Single    Spouse name: Not on file   Number of children: Not on file   Years of education: Not on file   Highest education level: Not on file  Occupational History   Not on file  Tobacco Use   Smoking status: Former    Packs/day: 0.50    Years: 44.00    Pack years: 22.00    Types: Cigarettes    Start date: 56    Quit date: 09/02/2020    Years since quitting: 0.4   Smokeless tobacco: Former   Tobacco comments:    Encouraged to cut back on number of cigs smoked  Vaping Use   Vaping Use: Never used  Substance and Sexual Activity   Alcohol use: Yes    Alcohol/week: 1.0 standard drink    Types: 1 Glasses of wine per week    Comment: 2 x a year   Drug use: No   Sexual activity: Not Currently  Other Topics Concern   Not on file  Social History Narrative   Not on file   Social Determinants of Health   Financial Resource Strain: Not on file  Food Insecurity: Not on file  Transportation Needs: Not on file  Physical Activity: Not on file  Stress: Not on file  Social Connections: Not on file   Family History  Problem Relation Age of Onset   Breast cancer Cousin 31    Allergies  Allergen Reactions   Amoxicillin    Codeine Nausea Only   Sulfa Antibiotics Nausea And Vomiting   Prior to Admission medications   Medication Sig Start Date End Date Taking? Authorizing Provider  albuterol (VENTOLIN HFA) 108 (90 Base) MCG/ACT inhaler Inhale 2 puffs into the lungs every 4 (four) hours as needed for wheezing or shortness of breath (or coughing). 01/27/21   Dione Booze, MD  benzonatate (TESSALON PERLES) 100 MG capsule Take 1 capsule (100 mg total) by mouth 3 (three) times daily as needed for cough. 10/02/20 10/02/21  Charlesetta Ivory, NP  Chlorphen-PE-Acetaminophen 4-10-325 MG TABS Take by mouth as needed.    [provider]  fluticasone (FLONASE) 50 MCG/ACT nasal spray SPRAY 1 SPRAY INTO BOTH NOSTRILS DAILY. 02/09/21   Dorothyann Peng, MD  levocetirizine (XYZAL) 5 MG tablet TAKE 1 TABLET BY MOUTH EVERY DAY IN THE EVENING 05/05/20   Dorothyann Peng, MD  levothyroxine (SYNTHROID) 100 MCG tablet Take one by tablet by mouth on Sundays 12/10/20   Dorothyann Peng, MD  levothyroxine (SYNTHROID) 88 MCG tablet Take one tablet by mouth  daily Monday through saturday 12/10/20   Dorothyann Peng, MD  predniSONE (DELTASONE) 50 MG tablet Take 1 tablet (50 mg total) by mouth daily. 01/27/21   Dione Booze, MD  rosuvastatin (CRESTOR) 10 MG tablet Take 1 tablet (10 mg total) by mouth daily. 02/25/20   Jake Bathe, MD  Vitamin D, Ergocalciferol, (DRISDOL) 1.25 MG (50000 UNIT) CAPS capsule TAKE 1 CAPSULE BY MOUTH ON TUESDAYS AND FRIDAYS 02/09/21   Dorothyann Peng, MD     Positive ROS: Otherwise negative  All other systems have been reviewed and were otherwise negative with the exception of those mentioned in the HPI and as above.  Physical Exam: Constitutional: Alert, well-appearing, no acute distress Ears: External ears without lesions or tenderness.  She had little bit of debris within the right ear canal that was cleaned with suction and alcohol irrigations.  After cleaning  the debris from the right ear canal she had mild inflammatory changes and I applied gentian violet, Ciprodex and CSF powder to the right ear..  On the left side patient has a purulent discharge from a central left TM perforation that was suctioned and cleaned after cleaning this applied Ciprodex drops to the left ear which were insufflated into the middle ear space some. Nasal: External nose without lesions. Septum with minimal deformity.  Both millimeters regions were clear with no signs of infection.  She does have history of allergies..  Oral: Lips and gums without lesions. Tongue and palate mucosa without lesions. Posterior oropharynx clear. Neck: No palpable adenopathy or masses Respiratory: Breathing comfortably  Skin: No facial/neck lesions or rash noted.  Cerumen impaction removal  Date/Time: 02/19/2021 4:18 PM Performed by: Drema Halon, MD Authorized by: Drema Halon, MD   Consent:    Consent obtained:  Verbal   Consent given by:  Patient   Risks discussed:  Pain and bleeding Procedure details:    Location:  L ear and R ear   Procedure type: suction   Post-procedure details:    Inspection:  TM intact and canal normal   Hearing quality:  Improved   Procedure completion:  Tolerated well, no immediate complications Comments:     Right ear canal with minimal inflammatory changes and a little bit of a debris that was cleaned with suction.  I applied gentian violet and CSF powder to the right ear canal after cleaning.  Left ear canal with purulent discharge from TM perforation and after cleaning the left ear canal I applied Ciprodex eardrops and CSF.  Assessment: Mild right external otitis. Left TM perforation with purulent discharge.  Plan: Recommend use of eardrops and prescribed Ciprodex eardrops in the left ear 4 to 5 drops twice daily for 5 days.  In addition placed her on Ceftin 250 mg twice daily for 10 days.  She states that she cannot take penicillin but  is allergic to amoxicillin.   Narda Bonds, MD

## 2021-03-09 ENCOUNTER — Other Ambulatory Visit: Payer: Self-pay

## 2021-03-09 ENCOUNTER — Ambulatory Visit (INDEPENDENT_AMBULATORY_CARE_PROVIDER_SITE_OTHER): Payer: Federal, State, Local not specified - PPO | Admitting: Internal Medicine

## 2021-03-09 ENCOUNTER — Encounter: Payer: Self-pay | Admitting: Internal Medicine

## 2021-03-09 VITALS — BP 138/80 | HR 84 | Temp 98.5°F | Ht 62.0 in | Wt 187.8 lb

## 2021-03-09 DIAGNOSIS — L304 Erythema intertrigo: Secondary | ICD-10-CM

## 2021-03-09 DIAGNOSIS — I700 Atherosclerosis of aorta: Secondary | ICD-10-CM

## 2021-03-09 DIAGNOSIS — E039 Hypothyroidism, unspecified: Secondary | ICD-10-CM | POA: Diagnosis not present

## 2021-03-09 DIAGNOSIS — R7309 Other abnormal glucose: Secondary | ICD-10-CM

## 2021-03-09 DIAGNOSIS — Z0001 Encounter for general adult medical examination with abnormal findings: Secondary | ICD-10-CM | POA: Diagnosis not present

## 2021-03-09 DIAGNOSIS — R030 Elevated blood-pressure reading, without diagnosis of hypertension: Secondary | ICD-10-CM | POA: Diagnosis not present

## 2021-03-09 DIAGNOSIS — Z6834 Body mass index (BMI) 34.0-34.9, adult: Secondary | ICD-10-CM

## 2021-03-09 DIAGNOSIS — E6609 Other obesity due to excess calories: Secondary | ICD-10-CM

## 2021-03-09 DIAGNOSIS — Z6833 Body mass index (BMI) 33.0-33.9, adult: Secondary | ICD-10-CM | POA: Insufficient documentation

## 2021-03-09 DIAGNOSIS — Z0000 Encounter for general adult medical examination without abnormal findings: Secondary | ICD-10-CM | POA: Diagnosis not present

## 2021-03-09 NOTE — Patient Instructions (Signed)
Health Maintenance, Female Adopting a healthy lifestyle and getting preventive care are important in promoting health and wellness. Ask your health care provider about: The right schedule for you to have regular tests and exams. Things you can do on your own to prevent diseases and keep yourself healthy. What should I know about diet, weight, and exercise? Eat a healthy diet  Eat a diet that includes plenty of vegetables, fruits, low-fat dairy products, and lean protein. Do not eat a lot of foods that are high in solid fats, added sugars, or sodium. Maintain a healthy weight Body mass index (BMI) is used to identify weight problems. It estimates body fat based on height and weight. Your health care provider can help determine your BMI and help you achieve or maintain a healthy weight. Get regular exercise Get regular exercise. This is one of the most important things you can do for your health. Most adults should: Exercise for at least 150 minutes each week. The exercise should increase your heart rate and make you sweat (moderate-intensity exercise). Do strengthening exercises at least twice a week. This is in addition to the moderate-intensity exercise. Spend less time sitting. Even light physical activity can be beneficial. Watch cholesterol and blood lipids Have your blood tested for lipids and cholesterol at 65 years of age, then have this test every 5 years. Have your cholesterol levels checked more often if: Your lipid or cholesterol levels are high. You are older than 65 years of age. You are at high risk for heart disease. What should I know about cancer screening? Depending on your health history and family history, you may need to have cancer screening at various ages. This may include screening for: Breast cancer. Cervical cancer. Colorectal cancer. Skin cancer. Lung cancer. What should I know about heart disease, diabetes, and high blood pressure? Blood pressure and heart  disease High blood pressure causes heart disease and increases the risk of stroke. This is more likely to develop in people who have high blood pressure readings, are of African descent, or are overweight. Have your blood pressure checked: Every 3-5 years if you are 18-39 years of age. Every year if you are 40 years old or older. Diabetes Have regular diabetes screenings. This checks your fasting blood sugar level. Have the screening done: Once every three years after age 40 if you are at a normal weight and have a low risk for diabetes. More often and at a younger age if you are overweight or have a high risk for diabetes. What should I know about preventing infection? Hepatitis B If you have a higher risk for hepatitis B, you should be screened for this virus. Talk with your health care provider to find out if you are at risk for hepatitis B infection. Hepatitis C Testing is recommended for: Everyone born from 1945 through 1965. Anyone with known risk factors for hepatitis C. Sexually transmitted infections (STIs) Get screened for STIs, including gonorrhea and chlamydia, if: You are sexually active and are younger than 65 years of age. You are older than 65 years of age and your health care provider tells you that you are at risk for this type of infection. Your sexual activity has changed since you were last screened, and you are at increased risk for chlamydia or gonorrhea. Ask your health care provider if you are at risk. Ask your health care provider about whether you are at high risk for HIV. Your health care provider may recommend a prescription medicine   to help prevent HIV infection. If you choose to take medicine to prevent HIV, you should first get tested for HIV. You should then be tested every 3 months for as long as you are taking the medicine. Pregnancy If you are about to stop having your period (premenopausal) and you may become pregnant, seek counseling before you get  pregnant. Take 400 to 800 micrograms (mcg) of folic acid every day if you become pregnant. Ask for birth control (contraception) if you want to prevent pregnancy. Osteoporosis and menopause Osteoporosis is a disease in which the bones lose minerals and strength with aging. This can result in bone fractures. If you are 65 years old or older, or if you are at risk for osteoporosis and fractures, ask your health care provider if you should: Be screened for bone loss. Take a calcium or vitamin D supplement to lower your risk of fractures. Be given hormone replacement therapy (HRT) to treat symptoms of menopause. Follow these instructions at home: Lifestyle Do not use any products that contain nicotine or tobacco, such as cigarettes, e-cigarettes, and chewing tobacco. If you need help quitting, ask your health care provider. Do not use street drugs. Do not share needles. Ask your health care provider for help if you need support or information about quitting drugs. Alcohol use Do not drink alcohol if: Your health care provider tells you not to drink. You are pregnant, may be pregnant, or are planning to become pregnant. If you drink alcohol: Limit how much you use to 0-1 drink a day. Limit intake if you are breastfeeding. Be aware of how much alcohol is in your drink. In the [place] one drink equals one 12 oz bottle of beer (355 mL), one 5 oz glass of wine (148 mL), or one 1 oz glass of hard liquor (44 mL). General instructions Schedule regular health, dental, and eye exams. Stay current with your vaccines. Tell your health care provider if: You often feel depressed. You have ever been abused or do not feel safe at home. Summary Adopting a healthy lifestyle and getting preventive care are important in promoting health and wellness. Follow your health care provider's instructions about healthy diet, exercising, and getting tested or screened for diseases. Follow your health care provider's  instructions on monitoring your cholesterol and blood pressure. This information is not intended to replace advice given to you by your health care provider. Make sure you discuss any questions you have with your health care provider. Document Revised: 07/11/2020 Document Reviewed: 04/26/2018 [employer]vier Patient Education  2022 [employer]  

## 2021-03-09 NOTE — Progress Notes (Signed)
I,Katawbba Wiggins,acting as a Education administrator for Maximino Greenland, MD.,have documented all relevant documentation on the behalf of Maximino Greenland, MD,as directed by  Maximino Greenland, MD while in the presence of Maximino Greenland, MD.  This visit occurred during the SARS-CoV-2 public health emergency.  Safety protocols were in place, including screening questions prior to the visit, additional usage of staff PPE, and extensive cleaning of exam room while observing appropriate contact time as indicated for disinfecting solutions.  Subjective:     Patient ID: Anita Duncan , female    DOB: 10-28-1955 , 65 y.o.   MRN: [id number]   Chief Complaint  Patient presents with   Annual Exam    HPI  She is here today for a full physical examination. She is followed by Dr. Ronita Hipps for her GYN exams. Last pap was February 09, 2021.  The patient has to return for a f/u pap because she had polyps.  Thyroid Problem Presents for follow-up visit. Patient reports no cold intolerance, constipation, depressed mood, menstrual problem, palpitations or tremors.    Past Medical History:  Diagnosis Date   Hypothyroidism    Seasonal allergies      Family History  Problem Relation Age of Onset   Breast cancer Cousin 9     Current Outpatient Medications:    benzonatate (TESSALON PERLES) 100 MG capsule, Take 1 capsule (100 mg total) by mouth 3 (three) times daily as needed for cough., Disp: 30 capsule, Rfl: 1   cefUROXime (CEFTIN) 250 MG tablet, Take 250 mg by mouth 2 (two) times daily with a meal., Disp: , Rfl:    ciprofloxacin-dexamethasone (CIPRODEX) OTIC suspension, Place 4 drops into the left ear 2 (two) times daily. 4 drops twice a day in the left ear x5 days or until drainage stops., Disp: 7.5 mL, Rfl: 0   fluticasone (FLONASE) 50 MCG/ACT nasal spray, SPRAY 1 SPRAY INTO BOTH NOSTRILS DAILY., Disp: 48 mL, Rfl: 1   levocetirizine (XYZAL) 5 MG tablet, TAKE 1 TABLET BY MOUTH EVERY DAY IN THE EVENING, Disp: 90  tablet, Rfl: 1   levothyroxine (SYNTHROID) 100 MCG tablet, Take one by tablet by mouth on Sundays, Disp: 39 tablet, Rfl: 1   levothyroxine (SYNTHROID) 88 MCG tablet, Take one tablet by mouth daily Monday through saturday, Disp: 52 tablet, Rfl: 2   rosuvastatin (CRESTOR) 10 MG tablet, Take 1 tablet (10 mg total) by mouth daily., Disp: 90 tablet, Rfl: 3   Vitamin D, Ergocalciferol, (DRISDOL) 1.25 MG (50000 UNIT) CAPS capsule, TAKE 1 CAPSULE BY MOUTH ON TUESDAYS AND FRIDAYS, Disp: 24 capsule, Rfl: 0   albuterol (VENTOLIN HFA) 108 (90 Base) MCG/ACT inhaler, Inhale 2 puffs into the lungs every 4 (four) hours as needed for wheezing or shortness of breath (or coughing). (Patient not taking: Reported on 03/09/2021), Disp: 1 each, Rfl: 0   Allergies  Allergen Reactions   Amoxicillin    Codeine Nausea Only   Sulfa Antibiotics Nausea And Vomiting      The patient states she uses post menopausal status for birth control. Last LMP was No LMP recorded. Patient is postmenopausal.. Negative for Dysmenorrhea. Negative for: breast discharge, breast lump(s), breast pain and breast self exam. Associated symptoms include abnormal vaginal bleeding. Pertinent negatives include abnormal bleeding (hematology), anxiety, decreased libido, depression, difficulty falling sleep, dyspareunia, history of infertility, nocturia, sexual dysfunction, sleep disturbances, urinary incontinence, urinary urgency, vaginal discharge and vaginal itching. Diet regular.The patient states her exercise level is  minimal.   .  The patient's tobacco use is:  Social History   Tobacco Use  Smoking Status Former   Packs/day: 0.50   Years: 44.00   Pack years: 22.00   Types: Cigarettes   Start date: 16   Quit date: 09/02/2020   Years since quitting: 0.5  Smokeless Tobacco Former  Tobacco Comments   Encouraged to cut back on number of cigs smoked  . She has been exposed to passive smoke. The patient's alcohol use is:  Social History    Substance and Sexual Activity  Alcohol Use Yes   Alcohol/week: 1.0 standard drink   Types: 1 Glasses of wine per week   Comment: 2 x a year   Review of Systems  Constitutional: Negative.   HENT: Negative.    Eyes: Negative.   Respiratory: Negative.    Cardiovascular: Negative.  Negative for palpitations.  Gastrointestinal: Negative.  Negative for constipation.  Endocrine: Negative.  Negative for cold intolerance.  Genitourinary: Negative.  Negative for menstrual problem.  Musculoskeletal: Negative.   Skin: Negative.   Allergic/Immunologic: Negative.   Neurological: Negative.  Negative for tremors.  Hematological: Negative.   Psychiatric/Behavioral: Negative.      Today's Vitals   03/09/21 1450  BP: 138/80  Pulse: 84  Temp: 98.5 F (36.9 C)  Weight: 187 lb 12.8 oz (85.2 kg)  Height: 5' 2"  (1.575 m)   Body mass index is 34.35 kg/m.  Wt Readings from Last 3 Encounters:  03/09/21 187 lb 12.8 oz (85.2 kg)  01/26/21 175 lb (79.4 kg)  09/29/20 177 lb 3.2 oz (80.4 kg)    BP Readings from Last 3 Encounters:  03/09/21 138/80  01/26/21 (!) 166/106  10/07/20 129/89    Objective:  Physical Exam Vitals and nursing note reviewed.  Constitutional:      Appearance: Normal appearance.  HENT:     Head: Normocephalic and atraumatic.     Right Ear: Tympanic membrane, ear canal and external ear normal.     Left Ear: Tympanic membrane, ear canal and external ear normal.     Ears:     Comments: No drainage. Violet specks b/l canals. Non-erythematous canals    Nose:     Comments: Masked     Mouth/Throat:     Comments: Masked  Eyes:     Extraocular Movements: Extraocular movements intact.     Conjunctiva/sclera: Conjunctivae normal.     Pupils: Pupils are equal, round, and reactive to light.  Cardiovascular:     Rate and Rhythm: Normal rate and regular rhythm.     Pulses: Normal pulses.     Heart sounds: Normal heart sounds.  Pulmonary:     Effort: Pulmonary effort is  normal.     Breath sounds: Normal breath sounds.  Chest:  Breasts:    Tanner Score is 5.     Comments: She kept her bra on for the exam.  Hyperpigmented scaly rash b/w breasts, no vesicular lesions noted Abdominal:     General: Abdomen is flat. Bowel sounds are normal.     Palpations: Abdomen is soft.  Genitourinary:    Comments: deferred Musculoskeletal:        General: Normal range of motion.     Cervical back: Normal range of motion and neck supple.  Skin:    General: Skin is warm and dry.  Neurological:     General: No focal deficit present.     Mental Status: She is alert and oriented to person, place, and time.  Psychiatric:  Mood and Affect: Mood normal.        Behavior: Behavior normal.        Assessment And Plan:     1. Encounter for annual physical exam Comments: A full exam was performed. Importance of monthly self breast exams was discussed with the patient. PATIENT IS ADVISED TO GET 30-45 MINUTES REGULAR EXERCISE NO LESS THAN FOUR TO FIVE DAYS PER WEEK - BOTH WEIGHTBEARING EXERCISES AND AEROBIC ARE RECOMMENDED.  PATIENT IS ADVISED TO FOLLOW A HEALTHY DIET WITH AT LEAST SIX FRUITS/VEGGIES PER DAY, DECREASE INTAKE OF RED MEAT, AND TO INCREASE FISH INTAKE TO TWO DAYS PER WEEK.  MEATS/FISH SHOULD NOT BE FRIED, BAKED OR BROILED IS PREFERABLE.  IT IS ALSO IMPORTANT TO CUT BACK ON YOUR SUGAR INTAKE. PLEASE AVOID ANYTHING WITH ADDED SUGAR, CORN SYRUP OR OTHER SWEETENERS. IF YOU MUST USE A SWEETENER, YOU CAN TRY STEVIA. IT IS ALSO IMPORTANT TO AVOID ARTIFICIALLY SWEETENERS AND DIET BEVERAGES. LASTLY, I SUGGEST WEARING SPF 50 SUNSCREEN ON EXPOSED PARTS AND ESPECIALLY WHEN IN THE DIRECT SUNLIGHT FOR AN EXTENDED PERIOD OF TIME.  PLEASE AVOID FAST FOOD RESTAURANTS AND INCREASE YOUR WATER INTAKE.  - CBC - CMP14+EGFR - Lipid panel  2. Primary hypothyroidism Comments: Her last TSH was August 2022, within normal limits. She will rto  Dec 2022 for lab visit. I will recheck  thyroid levels at that time.   3. Elevated blood pressure reading Comments: She is encouraged to follow low sodium diet. She was also given DASH diet information. EKG performed, NSR w/o acute changes. F/u 3-6 months.  - EKG 12-Lead  4. Atherosclerosis of aorta ([place]) Comments: Chronic, encouraged to follow heart healthy diet. She is encouraged to take statin as directed.   5. Other abnormal glucose Comments: Her a1c has been elevated in the past. I will recheck this today. She is encouraged to limit her intake of sweetened beverages, including diet drinks.  - Hemoglobin A1c  6. Intertrigo Comments: Located in between her breasts. She will c/w use of her current remedy w/ OTC powder.   7. Class 1 obesity due to excess calories with serious comorbidity and body mass index (BMI) of 34.0 to 34.9 in adult Comments: She is encouraged to strive for BMI less than 30 to decrease cardiac risk. Advised to incorporate more exercise into her daily.   Patient was given opportunity to ask questions. Patient verbalized understanding of the plan and was able to repeat key elements of the plan. All questions were answered to their satisfaction.   I, Maximino Greenland, MD, have reviewed all documentation for this visit. The documentation on 03/09/21 for the exam, diagnosis, procedures, and orders are all accurate and complete.   THE PATIENT IS ENCOURAGED TO PRACTICE SOCIAL DISTANCING DUE TO THE COVID-19 PANDEMIC.

## 2021-03-10 LAB — LIPID PANEL
Chol/HDL Ratio: 2.5 ratio (ref 0.0–4.4)
Cholesterol, Total: 215 mg/dL — ABNORMAL HIGH (ref 100–199)
HDL: 85 mg/dL (ref >39)
LDL Chol Calc (NIH): 118 mg/dL — ABNORMAL HIGH (ref 0–99)
Triglycerides: 67 mg/dL (ref 0–149)
VLDL Cholesterol Cal: 12 mg/dL (ref 5–40)

## 2021-03-10 LAB — CMP14+EGFR
ALT: 13 IU/L (ref 0–32)
AST: 23 IU/L (ref 0–40)
Albumin/Globulin Ratio: 1.7 (ref 1.2–2.2)
Albumin: 4.8 g/dL (ref 3.8–4.8)
Alkaline Phosphatase: 94 IU/L (ref 44–121)
BUN/Creatinine Ratio: 13 (ref 12–28)
BUN: 12 mg/dL (ref 8–27)
Bilirubin Total: 0.6 mg/dL (ref 0.0–1.2)
CO2: 23 mmol/L (ref 20–29)
Calcium: 9.4 mg/dL (ref 8.7–10.3)
Chloride: 103 mmol/L (ref 96–106)
Creatinine, Ser: 0.92 mg/dL (ref 0.57–1.00)
Globulin, Total: 2.9 g/dL (ref 1.5–4.5)
Glucose: 93 mg/dL (ref 70–99)
Potassium: 4.3 mmol/L (ref 3.5–5.2)
Sodium: 141 mmol/L (ref 134–144)
Total Protein: 7.7 g/dL (ref 6.0–8.5)
eGFR: 70 mL/min/{1.73_m2} (ref >59)

## 2021-03-10 LAB — CBC
Hematocrit: 38.6 % (ref 34.0–46.6)
Hemoglobin: 13 g/dL (ref 11.1–15.9)
MCH: 28.6 pg (ref 26.6–33.0)
MCHC: 33.7 g/dL (ref 31.5–35.7)
MCV: 85 fL (ref 79–97)
Platelets: 286 10*3/uL (ref 150–450)
RBC: 4.55 x10E6/uL (ref 3.77–5.28)
RDW: 12.4 % (ref 11.7–15.4)
WBC: 7.1 10*3/uL (ref 3.4–10.8)

## 2021-03-10 LAB — HEMOGLOBIN A1C
Est. average glucose Bld gHb Est-mCnc: 126 mg/dL
Hgb A1c MFr Bld: 6 % — ABNORMAL HIGH (ref 4.8–5.6)

## 2021-03-30 DIAGNOSIS — R87619 Unspecified abnormal cytological findings in specimens from cervix uteri: Secondary | ICD-10-CM | POA: Diagnosis not present

## 2021-04-06 ENCOUNTER — Telehealth: Payer: Self-pay | Admitting: Orthopedic Surgery

## 2021-04-06 ENCOUNTER — Other Ambulatory Visit: Payer: Self-pay

## 2021-04-06 ENCOUNTER — Ambulatory Visit (INDEPENDENT_AMBULATORY_CARE_PROVIDER_SITE_OTHER): Admitting: Orthopedic Surgery

## 2021-04-06 DIAGNOSIS — M7541 Impingement syndrome of right shoulder: Secondary | ICD-10-CM

## 2021-04-06 MED ORDER — NABUMETONE 750 MG PO TABS: 750.0000 mg | ORAL_TABLET | Freq: Two times a day (BID) | ORAL | 3 refills | Status: DC | PRN

## 2021-04-06 NOTE — Telephone Encounter (Signed)
Received medical records release form from patient for DOS 12/29/2020

## 2021-04-07 ENCOUNTER — Encounter: Payer: Self-pay | Admitting: Orthopedic Surgery

## 2021-04-07 NOTE — Progress Notes (Signed)
Office Visit Note   Patient: Anita Duncan           Date of Birth: [birth year]           MRN: [id number] Visit Date: 04/06/2021              Requested by: Dorothyann Peng, MD [address] [place],  [place] [address] PCP: Dorothyann Peng, MD  Chief Complaint  Patient presents with   Right Shoulder - Follow-up      HPI: Patient is a 65 year old woman who presents in follow-up for impingement symptoms right shoulder work-related injury.  Patient states she has been having increasing pain with the decreasing temperature.  Patient states she has increased pain with internal rotation as well as external rotation.  She states she is having pain in both shoulders at this time.  She has had good relief with using Relafen.  Assessment & Plan: Visit Diagnoses:  1. Impingement syndrome of right shoulder     Plan: A prescription was called in for Relafen.  Discussed that she could try Voltaren gel.  Patient was given a work note stating that there is no change in her work restrictions.  Follow-Up Instructions: Return if symptoms worsen or fail to improve.   Ortho Exam  Patient is alert, oriented, no adenopathy, well-dressed, normal affect, normal respiratory effort. Examination patient has abduction and flexion to 90 degrees bilaterally.  She has internal and external rotation of 45 degrees and has pain at the end of the range of motion.  Imaging: No results found. No images are attached to the encounter.  Labs: Lab Results  Component Value Date   HGBA1C 6.0 (H) 03/09/2021   HGBA1C 6.2 (H) 08/26/2020   HGBA1C 6.1 (H) 02/26/2020   ESRSEDRATE 22 01/12/2011     Lab Results  Component Value Date   ALBUMIN 4.8 03/09/2021   ALBUMIN 4.4 08/26/2020   ALBUMIN 4.6 02/26/2020    No results found for: MG Lab Results  Component Value Date   VD25OH 18.0 (L) 02/26/2020    No results found for: PREALBUMIN CBC EXTENDED Latest Ref Rng & Units 03/09/2021 02/26/2020  02/20/2019  WBC 3.4 - 10.8 x10E3/uL 7.1 7.6 6.7  RBC 3.77 - 5.28 x10E6/uL 4.55 4.29 4.11  HGB 11.1 - 15.9 g/dL 03.5 00.9 38.1  HCT 82.9 - 46.6 % 38.6 38.1 35.3  PLT 150 - 450 x10E3/uL 286 266 238  NEUTROABS 1.7 - 7.7 K/uL - - -  LYMPHSABS 0.7 - 4.0 K/uL - - -     There is no height or weight on file to calculate BMI.  Orders:  No orders of the defined types were placed in this encounter.  Meds ordered this encounter  Medications   nabumetone (RELAFEN) 750 MG tablet    Sig: Take 1 tablet (750 mg total) by mouth 2 (two) times daily as needed for mild pain or moderate pain. with food    Dispense:  60 tablet    Refill:  3     Procedures: No procedures performed  Clinical Data: No additional findings.  ROS:  All other systems negative, except as noted in the HPI. Review of Systems  Objective: Vital Signs: There were no vitals taken for this visit.  Specialty Comments:  No specialty comments available.  PMFS History: Patient Active Problem List   Diagnosis Date Noted   Primary hypothyroidism 03/09/2021   Elevated blood pressure reading 03/09/2021   Atherosclerosis of aorta ([facility]) 03/09/2021   Other  abnormal glucose 03/09/2021   Intertrigo 03/09/2021   Class 1 obesity due to excess calories with serious comorbidity and body mass index (BMI) of 34.0 to 34.9 in adult 03/09/2021   Impingement syndrome of right shoulder 06/21/2016   Past Medical History:  Diagnosis Date   Hypothyroidism    Seasonal allergies     Family History  Problem Relation Age of Onset   Breast cancer Cousin 32    Past Surgical History:  Procedure Laterality Date   BREAST BIOPSY Right    BREAST BIOPSY Right    BREAST EXCISIONAL BIOPSY Right    BREAST LUMPECTOMY     CESAREAN SECTION     Social History   Occupational History   Not on file  Tobacco Use   Smoking status: Former    Packs/day: 0.50    Years: 44.00    Pack years: 22.00    Types: Cigarettes    Start date: 66    Quit  date: 09/02/2020    Years since quitting: 0.5   Smokeless tobacco: Former   Tobacco comments:    Encouraged to cut back on number of cigs smoked  Vaping Use   Vaping Use: Never used  Substance and Sexual Activity   Alcohol use: Yes    Alcohol/week: 1.0 standard drink    Types: 1 Glasses of wine per week    Comment: 2 x a year   Drug use: No   Sexual activity: Not Currently

## 2021-04-30 ENCOUNTER — Other Ambulatory Visit: Payer: Self-pay | Admitting: Internal Medicine

## 2021-05-04 ENCOUNTER — Other Ambulatory Visit: Payer: Self-pay

## 2021-05-04 ENCOUNTER — Other Ambulatory Visit: Payer: Federal, State, Local not specified - PPO

## 2021-05-04 DIAGNOSIS — E039 Hypothyroidism, unspecified: Secondary | ICD-10-CM | POA: Diagnosis not present

## 2021-05-05 LAB — T3, FREE: T3, Free: 2.7 pg/mL (ref 2.0–4.4)

## 2021-05-05 LAB — TSH: TSH: 2.11 u[IU]/mL (ref 0.450–4.500)

## 2021-05-05 LAB — T4, FREE: Free T4: 1.27 ng/dL (ref 0.82–1.77)

## 2021-06-03 ENCOUNTER — Other Ambulatory Visit: Payer: Self-pay | Admitting: Orthopedic Surgery

## 2021-06-17 ENCOUNTER — Other Ambulatory Visit: Payer: Self-pay

## 2021-06-17 ENCOUNTER — Ambulatory Visit (INDEPENDENT_AMBULATORY_CARE_PROVIDER_SITE_OTHER): Payer: PPO | Admitting: Internal Medicine

## 2021-06-17 ENCOUNTER — Encounter: Payer: Self-pay | Admitting: Internal Medicine

## 2021-06-17 VITALS — BP 118/80 | HR 64 | Temp 98.2°F | Ht 62.0 in | Wt 186.0 lb

## 2021-06-17 DIAGNOSIS — I700 Atherosclerosis of aorta: Secondary | ICD-10-CM

## 2021-06-17 DIAGNOSIS — J301 Allergic rhinitis due to pollen: Secondary | ICD-10-CM | POA: Diagnosis not present

## 2021-06-17 DIAGNOSIS — E6609 Other obesity due to excess calories: Secondary | ICD-10-CM

## 2021-06-17 DIAGNOSIS — Z6834 Body mass index (BMI) 34.0-34.9, adult: Secondary | ICD-10-CM

## 2021-06-17 DIAGNOSIS — R0981 Nasal congestion: Secondary | ICD-10-CM | POA: Diagnosis not present

## 2021-06-17 NOTE — Patient Instructions (Signed)
Sinus Headache ?A sinus headache happens when your sinuses get swollen or blocked (clogged). Sinuses are spaces behind the bones of your face and forehead. You may feel pain or pressure in your face, forehead, ears, or upper teeth. Sinus headaches can be mild or very bad. ?Follow these instructions at home: ?General instructions ?If told: ?Apply a warm, moist washcloth to your face. This can help to lessen pain. ?Use a nasal saline wash. Follow the directions on the bottle or box. ?Hydrate and humidify ?Drink enough water to keep your pee (urine) pale yellow. ?Use a cool mist humidifier to keep the humidity level in your home above 50%. ?Breathe in steam for 10-15 minutes, 3-4 times a day or as told by your doctor. You can do this in the bathroom while a hot shower is running. ?Try not to spend time in cool or dry air. ?Medicines ? ?Take over-the-counter and prescription medicines only as told by your doctor. ?If you were prescribed an antibiotic medicine, take it as told by your doctor. Do not stop taking it even if you start to feel better. ?Use a nose spray if your nose feels full of mucus (congested). ?Contact a doctor if: ?You get more than one headache a week. ?Light or sound bothers you. ?You have a fever. ?You feel sick to your stomach (nauseous) or you throw up (vomit). ?Your headaches do not get better with treatment. ?Get help right away if: ?You have trouble seeing. ?You suddenly have very bad pain in your face or head. ?You start to have quick, sudden movements or shaking that you cannot control (seizure). ?You are confused. ?You have a stiff neck. ?Summary ?A sinus headache happens when your sinuses get swollen or blocked (clogged). Sinuses are spaces behind the bones of your face and forehead. ?You may feel pain or pressure in your face, forehead, ears, or upper teeth. ?Take over-the-counter and prescription medicines only as told by your doctor. ?If told, apply a warm, moist washcloth to your face.  This can help to lessen pain. ?This information is not intended to replace advice given to you by your health care provider. Make sure you discuss any questions you have with your health care provider. ?Document Revised: 02/08/2020 Document Reviewed: 02/12/2020 ?Elsevier Patient Education ? 2022 [employer] ? ?

## 2021-06-17 NOTE — Progress Notes (Signed)
Jeri Cos Llittleton,acting as a Neurosurgeon for [name], MD.,have documented all relevant documentation on the behalf of [name], MD,as directed by  [name], MD while in the presence of [name], MD.  This visit occurred during the SARS-CoV-2 public health emergency.  Safety protocols were in place, including screening questions prior to the visit, additional usage of staff PPE, and extensive cleaning of exam room while observing appropriate contact time as indicated for disinfecting solutions.  Subjective:     Patient ID: Anita Duncan , female    DOB: 05-Feb-1956 , 66 y.o.   MRN: [id number]   Chief Complaint  Patient presents with   URI    HPI  She presents today for further evaluation of sinus congestion/drainage. She states she sees white mucus when she blows her nose. Denies fever/chills/facial pain. She adds that her ears have started to itch as well. She states cream prescribed by ENT has helped with the itching. Denies ear pain. Patient stated she would like a refill on her cipro ear drops if needed.   URI  This is a new problem. The current episode started in the past 7 days. The problem has been gradually improving. There has been no fever. Associated symptoms include congestion, coughing and headaches. Pertinent negatives include no ear pain, sinus pain or sore throat.    Past Medical History:  Diagnosis Date   Hypothyroidism    Seasonal allergies      Family History  Problem Relation Age of Onset   Breast cancer Cousin 10     Current Outpatient Medications:    benzonatate (TESSALON PERLES) 100 MG capsule, Take 1 capsule (100 mg total) by mouth 3 (three) times daily as needed for cough., Disp: 30 capsule, Rfl: 1   fluticasone (FLONASE) 50 MCG/ACT nasal spray, SPRAY 1 SPRAY INTO BOTH NOSTRILS DAILY., Disp: 48 mL, Rfl: 1   levocetirizine (XYZAL) 5 MG tablet, TAKE 1 TABLET BY MOUTH EVERY DAY IN THE EVENING, Disp: 90 tablet, Rfl: 1    levothyroxine (SYNTHROID) 100 MCG tablet, Take one by tablet by mouth on Sundays, Disp: 39 tablet, Rfl: 1   levothyroxine (SYNTHROID) 88 MCG tablet, Take one tablet by mouth daily Monday through saturday, Disp: 52 tablet, Rfl: 2   nabumetone (RELAFEN) 750 MG tablet, TAKE 1 TABLET BY MOUTH TWICE  DAILY WITH FOOD AS NEEDED FOR  MILD OR MODERATE PAIN, Disp: 180 tablet, Rfl: 3   rosuvastatin (CRESTOR) 10 MG tablet, Take 1 tablet (10 mg total) by mouth daily., Disp: 90 tablet, Rfl: 3   Vitamin D, Ergocalciferol, (DRISDOL) 1.25 MG (50000 UNIT) CAPS capsule, TAKE 1 CAPSULE BY MOUTH ON TUESDAYS AND FRIDAYS, Disp: 24 capsule, Rfl: 0   ciprofloxacin-dexamethasone (CIPRODEX) OTIC suspension, Place 4 drops into the left ear 2 (two) times daily. 4 drops twice a day in the left ear x5 days or until drainage stops. (Patient not taking: Reported on 06/17/2021), Disp: 7.5 mL, Rfl: 0   Allergies  Allergen Reactions   Amoxicillin Itching   Ceftin [Cefuroxime] Itching   Codeine Nausea Only   Sulfa Antibiotics Nausea And Vomiting     Review of Systems  Constitutional: Negative.   HENT:  Positive for congestion. Negative for ear pain, sinus pain and sore throat.   Respiratory:  Positive for cough.        Dry cough.   Cardiovascular: Negative.   Gastrointestinal: Negative.   Neurological:  Positive for headaches.  Psychiatric/Behavioral: Negative.  Today's Vitals   06/17/21 1614  BP: 118/80  Pulse: 64  Temp: 98.2 F (36.8 C)  Weight: 186 lb (84.4 kg)  Height: 5\' 2"  (1.575 m)  PainSc: 0-No pain   Body mass index is 34.02 kg/m.  Wt Readings from Last 3 Encounters:  06/17/21 186 lb (84.4 kg)  03/09/21 187 lb 12.8 oz (85.2 kg)  01/26/21 175 lb (79.4 kg)     Objective:  Physical Exam Vitals and nursing note reviewed.  Constitutional:      Appearance: Normal appearance.  HENT:     Head: Normocephalic and atraumatic.     Right Ear: Tympanic membrane, ear canal and external ear normal. There is  no impacted cerumen.     Left Ear: Tympanic membrane, ear canal and external ear normal. There is no impacted cerumen.     Nose:     Comments: Masked     Mouth/Throat:     Comments: Masked  Eyes:     Extraocular Movements: Extraocular movements intact.  Cardiovascular:     Rate and Rhythm: Normal rate and regular rhythm.     Heart sounds: Normal heart sounds.  Pulmonary:     Effort: Pulmonary effort is normal.     Breath sounds: Normal breath sounds.  Musculoskeletal:     Cervical back: Normal range of motion.  Skin:    General: Skin is warm.  Neurological:     General: No focal deficit present.     Mental Status: She is alert.  Psychiatric:        Mood and Affect: Mood normal.        Behavior: Behavior normal.        Assessment And Plan:     1. Sinus congestion Comments: She is advised to avoid dairy products and to stay well hydrated. She was given samples of Norel AD to take twice daily prn.  - Ambulatory referral to Allergy  2. Seasonal allergic rhinitis due to pollen Comments: She agrees to referral to Allergy/Immunology for formal allergy testing.  - Ambulatory referral to Allergy  3. Atherosclerosis of aorta ([facility]CC) Comments: Chronic, importance of statin compliance was d/w patient. Advised to follow heart healthy lifestyle.   4. Class 1 obesity due to excess calories with serious comorbidity and body mass index (BMI) of 34.0 to 34.9 in adult Comments: She is encouraged to strive for BMI <30 to decrease cardiac risk. Advised to aim for at least 150 minutes of exercise per week.    Patient was given opportunity to ask questions. Patient verbalized understanding of the plan and was able to repeat key elements of the plan. All questions were answered to their satisfaction.   I, 03/28/21, MD, have reviewed all documentation for this visit. The documentation on 06/18/21 for the exam, diagnosis, procedures, and orders are all accurate and complete.   IF YOU HAVE  BEEN REFERRED TO A SPECIALIST, IT MAY TAKE 1-2 WEEKS TO SCHEDULE/PROCESS THE REFERRAL. IF YOU HAVE NOT HEARD FROM US/SPECIALIST IN TWO WEEKS, PLEASE GIVE 08/16/21 A CALL AT [phone].   THE PATIENT IS ENCOURAGED TO PRACTICE SOCIAL DISTANCING DUE TO THE COVID-19 PANDEMIC.

## 2021-06-24 ENCOUNTER — Other Ambulatory Visit: Payer: Self-pay

## 2021-06-24 MED ORDER — NOREL AD 4-10-325 MG PO TABS: 1.0000 | ORAL_TABLET | Freq: Every day | ORAL | 0 refills | Status: DC | PRN

## 2021-07-20 DIAGNOSIS — H2513 Age-related nuclear cataract, bilateral: Secondary | ICD-10-CM | POA: Diagnosis not present

## 2021-07-23 ENCOUNTER — Other Ambulatory Visit: Payer: Self-pay | Admitting: Obstetrics and Gynecology

## 2021-07-23 DIAGNOSIS — Z1231 Encounter for screening mammogram for malignant neoplasm of breast: Secondary | ICD-10-CM

## 2021-07-30 ENCOUNTER — Encounter: Payer: Self-pay | Admitting: Allergy

## 2021-07-30 ENCOUNTER — Other Ambulatory Visit: Payer: Self-pay

## 2021-07-30 ENCOUNTER — Ambulatory Visit (INDEPENDENT_AMBULATORY_CARE_PROVIDER_SITE_OTHER): Payer: PPO | Admitting: Allergy

## 2021-07-30 VITALS — BP 142/80 | HR 95 | Temp 97.6°F | Resp 20 | Ht 62.0 in | Wt 186.0 lb

## 2021-07-30 DIAGNOSIS — T781XXD Other adverse food reactions, not elsewhere classified, subsequent encounter: Secondary | ICD-10-CM | POA: Diagnosis not present

## 2021-07-30 DIAGNOSIS — J3089 Other allergic rhinitis: Secondary | ICD-10-CM | POA: Diagnosis not present

## 2021-07-30 DIAGNOSIS — T781XXA Other adverse food reactions, not elsewhere classified, initial encounter: Secondary | ICD-10-CM

## 2021-07-30 DIAGNOSIS — H1013 Acute atopic conjunctivitis, bilateral: Secondary | ICD-10-CM

## 2021-07-30 MED ORDER — EPINEPHRINE 0.3 MG/0.3ML IJ SOAJ: 0.3000 mg | INTRAMUSCULAR | 1 refills | Status: AC | PRN

## 2021-07-30 MED ORDER — RYALTRIS 665-25 MCG/ACT NA SUSP: 2.0000 | Freq: Every day | NASAL | 5 refills | Status: DC | PRN

## 2021-07-30 NOTE — Patient Instructions (Addendum)
-   Testing today showed: trees and outdoor molds. ?- Copy of test results provided.  ?- Avoidance measures provided. ?- Continue with: Xyzal (levocetirizine) 5mg  tablet once daily.  ?- Start taking: Ryaltris (olopatadine/mometasone) two sprays per nostril 1-2 times daily as needed.  This is a combo spray with nasal steroid (mometasone - this is similar to flonase for nasal congestion control) and nasal antihistamine (olopatadine) for nasal drainage control.   ?Pazeo (olopatadine) one drop per eye daily as needed for itchy/watery eyes.  ?- You can use an extra dose of the antihistamine, if needed, for breakthrough symptoms.  ?- Consider nasal saline rinses 1-2 times daily to remove allergens from the nasal cavities as well as help with mucous clearance (this is especially helpful to do before the nasal sprays are given) ?- Consider allergy shots as a means of long-term control if medication management is not effective enough ?- Allergy shots "re-train" and "reset" the immune system to ignore environmental allergens and decrease the resulting immune response to those allergens (sneezing, itchy watery eyes, runny nose, nasal congestion, etc).    ?- Allergy shots improve symptoms in 80-85% of patients.  ?- We can discuss more at future appointment if the medications are not working for you. ? ?- Food testing is negative to peanuts, tree nuts, shellfish and orange/citrus ?- We will obtain serum IgE levels to these foods to help determine if you can perform in-office food challenges.  Challenges help determine if you are not allergic to the foods ?- avoid these foods for now while awaiting labs ?- Would recommend access to epinephrine device in case of allergic reaction ? ?Follow-up in 4-6 months or sooner if needed ? ?

## 2021-07-30 NOTE — Progress Notes (Signed)
? ? ?New Patient Note ? ?RE: Anita Duncan MRN: [id number] DOB: 1955-09-27 ?Date of Office Visit: 07/30/2021 ? ? ?Primary care provider: Dorothyann Peng, MD ? ?Chief Complaint: Allergies ? ?History of present illness: ?Anita Duncan is a 66 y.o. female presenting today for evaluation of allergies (environmental and possible food). ? ?With environmental allergies she reports symptoms of ear itch, congestion, runny nose with cough, eye itch.  She has been on xyzal and states that has been working.  She has also taken norel AD which also has helped.   ?She has taken claritin in the past.  She states zyrtec and allegra make her drowsy.  For her ear symptoms she was prescribed cipro drops as well as betemethasone cream by Dr. Ezzard Standing before he retired.  She has an appt in May to see Dr. Suszanne Conners to take over for her ENT care.   ? ?She has had itchy skin after eating almonds/peanuts/nuts.  She states she does love mixed nuts.   ? ?She also has had itchy, rash after eating fresh shellfish in [place] and this was about 7 years ago.  ?About 3 years ago she had freshshellfish again and had itchy, rash that was "red stripes" across her chest.  ?States she can eat white fish but does note issues like itchiness when she has had salmon. ?About 2 years ago she states she had some shrimp in a restaurant here and she states the next day her lips felt and looked partially burnt.   ? ?She also reports she can't eat oranges/citrus as causes itching.  She states orange ingestion also made her lips tingle.  ? ?She also states she has dry skin related to her thyroid as she has hypothyroidism on levothyroxine.   ? ?She has been prescribed albuterol from an ED visit due to cough around the time she stopped smoking.  She also has had tessalon perls.  She has no history of asthma. ? ?She had allergy testing about 10+ years ago and she does recall tree pollen and mold being positive.   ? ?Review of systems: ?Review of Systems  ?Constitutional:  Negative.   ?HENT:    ?     See HPI  ?Eyes:   ?     See HPI  ?Respiratory: Negative.    ?Cardiovascular: Negative.   ?Gastrointestinal: Negative.   ?Musculoskeletal: Negative.   ?Skin: Negative.   ?Allergic/Immunologic: Negative.   ?Neurological: Negative.   ? ?All other systems negative unless noted above in HPI ? ?Past medical history: ?Past Medical History:  ?Diagnosis Date  ? Hypothyroidism   ? Seasonal allergies   ? ? ?Past surgical history: ?Past Surgical History:  ?Procedure Laterality Date  ? BREAST BIOPSY Right   ? BREAST BIOPSY Right   ? BREAST EXCISIONAL BIOPSY Right   ? BREAST LUMPECTOMY    ? CESAREAN SECTION    ? TONSILLECTOMY    ? ? ?Family history:  ?Family History  ?Problem Relation Age of Onset  ? Allergic rhinitis Mother   ? Breast cancer Cousin 31  ? Asthma Son   ? ? ?Social history: ?Lives in a townhome with carpeting in the bedroom with electric and gas heating and central cooling.  No pets in the home.  There is no concern for water damage, mildew or roaches in the home.  She is retired.  She has a smoking history of cigarettes 6 to 8/day from 1980 till April 2022. ? ? ?Medication List: ?Current Outpatient Medications  ?Medication  Sig Dispense Refill  ? Chlorphen-PE-Acetaminophen (NOREL AD) 4-10-325 MG TABS Take 1 tablet by mouth daily as needed. 30 tablet 0  ? ciprofloxacin-dexamethasone (CIPRODEX) OTIC suspension Place 4 drops into the left ear 2 (two) times daily. 4 drops twice a day in the left ear x5 days or until drainage stops. 7.5 mL 0  ? fluticasone (FLONASE) 50 MCG/ACT nasal spray SPRAY 1 SPRAY INTO BOTH NOSTRILS DAILY. 48 mL 1  ? levocetirizine (XYZAL) 5 MG tablet TAKE 1 TABLET BY MOUTH EVERY DAY IN THE EVENING 90 tablet 1  ? levothyroxine (SYNTHROID) 100 MCG tablet Take one by tablet by mouth on Sundays 39 tablet 1  ? levothyroxine (SYNTHROID) 88 MCG tablet Take one tablet by mouth daily Monday through saturday 52 tablet 2  ? nabumetone (RELAFEN) 750 MG tablet TAKE 1 TABLET BY MOUTH  TWICE  DAILY WITH FOOD AS NEEDED FOR  MILD OR MODERATE PAIN 180 tablet 3  ? rosuvastatin (CRESTOR) 10 MG tablet Take 1 tablet (10 mg total) by mouth daily. 90 tablet 3  ? Vitamin D, Ergocalciferol, (DRISDOL) 1.25 MG (50000 UNIT) CAPS capsule TAKE 1 CAPSULE BY MOUTH ON TUESDAYS AND FRIDAYS 24 capsule 0  ? benzonatate (TESSALON PERLES) 100 MG capsule Take 1 capsule (100 mg total) by mouth 3 (three) times daily as needed for cough. (Patient not taking: Reported on 07/30/2021) 30 capsule 1  ? ?No current facility-administered medications for this visit.  ? ? ?Known medication allergies: ?Allergies  ?Allergen Reactions  ? Amoxicillin Itching  ? Ceftin [Cefuroxime] Itching  ? Codeine Nausea Only  ? Sulfa Antibiotics Nausea And Vomiting  ? ? ? ?Physical examination: ?Blood pressure (!) 142/80, pulse 95, temperature 97.6 ?F (36.4 ?C), temperature source Temporal, resp. rate 20, height 5\' 2"  (1.575 m), weight 186 lb (84.4 kg), SpO2 97 %. ? ?General: Alert, interactive, in no acute distress. ?HEENT: PERRLA, TMs pearly gray, turbinates minimally edematous without discharge, post-pharynx non erythematous. ?Neck: Supple without lymphadenopathy. ?Lungs: Clear to auscultation without wheezing, rhonchi or rales. {no increased work of breathing. ?CV: Normal S1, S2 without murmurs. ?Abdomen: Nondistended, nontender. ?Skin: Warm and dry, without lesions or rashes. ?Extremities:  No clubbing, cyanosis or edema. ?Neuro:   Grossly intact. ? ?Diagnositics/Labs: ? ?Allergy testing:  ? Airborne Adult Perc - 07/30/21 1511   ? ? Time Antigen Placed 1516   ? Allergen Manufacturer Waynette ButteryGreer   ? Location Back   ? Number of Test 59   ? 1. Control-Buffer 50% Glycerol Negative   ? 2. Control-Histamine 1 mg/ml 2+   ? 3. Albumin saline Negative   ? 4. Bahia Negative   ? 5. [place] Negative   ? 6. Johnson Negative   ? 7. Kentucky Blue Negative   ? 8. Meadow Fescue Negative   ? 9. Perennial Rye Negative   ? 10. Sweet Vernal Negative   ? 11. Timothy Negative    ? 12. Cocklebur Negative   ? 13. Burweed Marshelder Negative   ? 14. Ragweed, short Negative   ? 15. Ragweed, Giant Negative   ? 16. Plantain,  English Negative   ? 17. Lamb's Quarters Negative   ? 18. Sheep Sorrell Negative   ? 19. Rough Pigweed Negative   ? 20. Marsh Elder, Rough Negative   ? 21. Mugwort, Common Negative   ? 22. Ash mix Negative   ? 23. Charletta CousinBirch mix Negative   ? 24. Beech American 2+   ? 25. Box, Elder Negative   ? 26. Cyd Silenceedar,  red Negative   ? 27. Cottonwood, Guinea[place]issau Negative   ? 28. Elm mix Negative   ? 29. Hickory Negative   ? 30. Maple mix Negative   ? 31. Oak, Guinea[place] mix Negative   ? 32. Pecan Pollen Negative   ? 33. Pine mix Negative   ? 34. Sycamore Eastern Negative   ? 35. Walnut, Black Pollen Negative   ? 36. Alternaria alternata Negative   ? 37. Cladosporium Herbarum Negative   ? 38. Aspergillus mix Negative   ? 39. Penicillium mix Negative   ? 40. Bipolaris sorokiniana (Helminthosporium) Negative   ? 41. Drechslera spicifera (Curvularia) Negative   ? 42. Mucor plumbeus Negative   ? 43. Fusarium moniliforme Negative   ? 44. Aureobasidium pullulans (pullulara) Negative   ? 45. Rhizopus oryzae 2+   ? 46. Botrytis cinera Negative   ? 47. Epicoccum nigrum Negative   ? 48. Phoma betae Negative   ? 49. Candida Albicans Negative   ? 50. Trichophyton mentagrophytes Negative   ? 51. Mite, D Farinae  5,000 AU/ml Negative   ? 52. Mite, D Pteronyssinus  5,000 AU/ml Negative   ? 53. Cat Hair 10,000 BAU/ml Negative   ? 54.  Dog Epithelia Negative   ? 55. Mixed Feathers Negative   ? 56. Horse Epithelia Negative   ? 57. Cockroach, [place] Negative   ? 58. Mouse Negative   ? 59. Tobacco Leaf Negative   ? ?  ?  ? ?  ? ? Food Adult Perc - 07/30/21 1500   ? ? Time Antigen Placed 1517   ? Allergen Manufacturer Waynette Buttery   ? Location Back   ? Number of allergen test 16   ? 1. Peanut Negative   ? 10. Cashew Negative   ? 11. Pecan Food Negative   ? 12. Walnut Food Negative   ? 13. Almond Negative   ? 14. Hazelnut  Negative   ? 15. [place] nut Negative   ? 16. Coconut Negative   ? 17. Pistachio Negative   ? 22. Salmon Negative   ? 25. Shrimp Negative   ? 26. Crab Negative   ? 27. Lobster Negative   ? 28. [employer]

## 2021-08-02 LAB — ALLERGEN PROFILE, SHELLFISH
Clam IgE: <0.1 kU/L
F023-IgE Crab: <0.1 kU/L
F080-IgE Lobster: <0.1 kU/L
F290-IgE Oyster: <0.1 kU/L
Scallop IgE: <0.1 kU/L
Shrimp IgE: <0.1 kU/L

## 2021-08-02 LAB — IGE NUT PROF. W/COMPONENT RFLX
F017-IgE Hazelnut (Filbert): <0.1 kU/L
F018-IgE Brazil Nut: <0.1 kU/L
F020-IgE Almond: <0.1 kU/L
F202-IgE Cashew Nut: <0.1 kU/L
F203-IgE Pistachio Nut: <0.1 kU/L
F256-IgE Walnut: <0.1 kU/L
Macadamia Nut, IgE: <0.1 kU/L
Peanut, IgE: <0.1 kU/L
Pecan Nut IgE: <0.1 kU/L

## 2021-08-02 LAB — ALLERGEN, LEMON, F208: Lemon: <0.1 kU/L

## 2021-08-02 LAB — ALLERGEN, ORANGE F33: Orange: <0.1 kU/L

## 2021-08-03 ENCOUNTER — Other Ambulatory Visit (HOSPITAL_COMMUNITY): Payer: Self-pay

## 2021-08-03 ENCOUNTER — Other Ambulatory Visit: Payer: Self-pay | Admitting: *Deleted

## 2021-08-03 ENCOUNTER — Ambulatory Visit: Payer: Federal, State, Local not specified - PPO | Admitting: Orthopedic Surgery

## 2021-08-03 MED ORDER — RYALTRIS 665-25 MCG/ACT NA SUSP
2.0000 | Freq: Every day | NASAL | 5 refills | Status: DC | PRN
  Filled 2021-08-03: qty 29, 30d supply, fill #0
  Filled 2021-09-23 – 2021-09-29 (×3): qty 29, 30d supply, fill #1

## 2021-08-03 NOTE — Telephone Encounter (Signed)
Patient called and stated that [facility] told her that the Ryaltris would be $100. She wanted to try and send it to a local pharmacy to see if the cost would be cheaper. I did advise I wasn't sure of what the outcome would be since it is normally sent to the Specialty Pharmacy. Patient verbalized understanding. Medication has been sent to [facility] to determine how the cost may be.  ?

## 2021-08-04 ENCOUNTER — Other Ambulatory Visit (HOSPITAL_COMMUNITY): Payer: Self-pay

## 2021-08-24 ENCOUNTER — Other Ambulatory Visit: Payer: Self-pay | Admitting: Internal Medicine

## 2021-08-31 ENCOUNTER — Ambulatory Visit: Payer: Federal, State, Local not specified - PPO

## 2021-09-03 ENCOUNTER — Ambulatory Visit
Admission: RE | Admit: 2021-09-03 | Discharge: 2021-09-03 | Disposition: A | Discharge status: Home / Self Care | Payer: PPO | Source: Ambulatory Visit | Attending: Obstetrics and Gynecology | Admitting: Obstetrics and Gynecology

## 2021-09-03 DIAGNOSIS — Z1231 Encounter for screening mammogram for malignant neoplasm of breast: Secondary | ICD-10-CM

## 2021-09-16 ENCOUNTER — Other Ambulatory Visit (HOSPITAL_COMMUNITY): Payer: Self-pay

## 2021-09-16 DIAGNOSIS — H608X3 Other otitis externa, bilateral: Secondary | ICD-10-CM | POA: Diagnosis not present

## 2021-09-16 MED ORDER — MOMETASONE FUROATE 0.1 % EX CREA
1.0000 "application " | TOPICAL_CREAM | Freq: Every day | CUTANEOUS | 5 refills | Status: DC | PRN
  Filled 2021-09-16: qty 15, 10d supply, fill #0
  Filled 2022-08-25: qty 15, 10d supply, fill #1

## 2021-09-16 MED ORDER — CIPROFLOXACIN-DEXAMETHASONE 0.3-0.1 % OT SUSP
4.0000 [drp] | OTIC | 5 refills | Status: DC
  Filled 2021-09-16: qty 7.5, 20d supply, fill #0
  Filled 2021-12-10: qty 7.5, 10d supply, fill #0
  Filled 2021-12-17: qty 7.5, 7d supply, fill #0
  Filled 2022-02-22: qty 7.5, 7d supply, fill #1

## 2021-09-22 ENCOUNTER — Other Ambulatory Visit: Payer: Self-pay

## 2021-09-22 ENCOUNTER — Encounter: Payer: Self-pay | Admitting: Internal Medicine

## 2021-09-22 ENCOUNTER — Ambulatory Visit (INDEPENDENT_AMBULATORY_CARE_PROVIDER_SITE_OTHER): Payer: PPO | Admitting: Internal Medicine

## 2021-09-22 VITALS — BP 130/84 | HR 93 | Temp 98.3°F | Ht 62.2 in | Wt 185.2 lb

## 2021-09-22 DIAGNOSIS — R8290 Unspecified abnormal findings in urine: Secondary | ICD-10-CM | POA: Diagnosis not present

## 2021-09-22 DIAGNOSIS — E039 Hypothyroidism, unspecified: Secondary | ICD-10-CM | POA: Diagnosis not present

## 2021-09-22 DIAGNOSIS — R030 Elevated blood-pressure reading, without diagnosis of hypertension: Secondary | ICD-10-CM

## 2021-09-22 DIAGNOSIS — Z79899 Other long term (current) drug therapy: Secondary | ICD-10-CM

## 2021-09-22 DIAGNOSIS — Z23 Encounter for immunization: Secondary | ICD-10-CM

## 2021-09-22 DIAGNOSIS — Z0000 Encounter for general adult medical examination without abnormal findings: Secondary | ICD-10-CM

## 2021-09-22 DIAGNOSIS — G4700 Insomnia, unspecified: Secondary | ICD-10-CM

## 2021-09-22 DIAGNOSIS — E6609 Other obesity due to excess calories: Secondary | ICD-10-CM

## 2021-09-22 DIAGNOSIS — R7309 Other abnormal glucose: Secondary | ICD-10-CM

## 2021-09-22 DIAGNOSIS — G4709 Other insomnia: Secondary | ICD-10-CM | POA: Diagnosis not present

## 2021-09-22 DIAGNOSIS — Z6833 Body mass index (BMI) 33.0-33.9, adult: Secondary | ICD-10-CM | POA: Diagnosis not present

## 2021-09-22 DIAGNOSIS — I700 Atherosclerosis of aorta: Secondary | ICD-10-CM | POA: Diagnosis not present

## 2021-09-22 LAB — POCT URINALYSIS DIPSTICK
Bilirubin, UA: NEGATIVE
Glucose, UA: NEGATIVE
Ketones, UA: NEGATIVE
Nitrite, UA: POSITIVE
Protein, UA: NEGATIVE
Spec Grav, UA: 1.025 (ref 1.010–1.025)
Urobilinogen, UA: 0.2 E.U./dL
pH, UA: 5.5 (ref 5.0–8.0)

## 2021-09-22 MED ORDER — ROSUVASTATIN CALCIUM 20 MG PO TABS
ORAL_TABLET | ORAL | 1 refills | Status: DC
  Filled 2021-09-22: qty 75, 90d supply, fill #0
  Filled 2021-12-17: qty 75, 90d supply, fill #1

## 2021-09-22 NOTE — Patient Instructions (Addendum)
Magnesium glycinate - helps with sleep, take nightly around 10pm ?You can get from health food store ? ?Ms. Ramires , ?Thank you for taking time to come for your Medicare Wellness Visit. I appreciate your ongoing commitment to your health goals. Please review the following plan we discussed and let me know if I can assist you in the future.  ? ?These are the goals we discussed: ? Goals   ? ?  Increase physical activity   ?  Aim for at least 150 minutes of exercise per week ?  ? ?  ?  ?This is a list of the screening recommended for you and due dates:  ?Health Maintenance  ?Topic Date Due  ? Pneumonia Vaccine (1 - PCV) Never done  ? Zoster (Shingles) Vaccine (1 of 2) Never done  ? Pap Smear  10/28/2020  ? HIV Screening  03/09/2022*  ? Flu Shot  12/15/2021  ? Mammogram  09/04/2023  ? Tetanus Vaccine  09/11/2023  ? Colon Cancer Screening  08/20/2025  ? DEXA scan (bone density measurement)  Completed  ? COVID-19 Vaccine  Completed  ? Hepatitis C Screening: USPSTF Recommendation to screen - Ages 30-79 yo.  Completed  ? HPV Vaccine  Aged Out  ?*Topic was postponed. The date shown is not the original due date.  ? ? ?

## 2021-09-22 NOTE — Progress Notes (Signed)
Subjective:    Anita Duncan is a 66 y.o. female who presents for a Welcome to Medicare exam. She reports compliance with meds. She denies having any issues with her meds.   Review of Systems Review of Systems  Constitutional: Negative.   HENT: Negative.    Eyes: Negative.   Respiratory: Negative.    Cardiovascular: Negative.   Gastrointestinal: Negative.   Genitourinary: Negative.   Musculoskeletal: Negative.   Skin: Negative.   Neurological: Negative.   Endo/Heme/Allergies: Negative.   Psychiatric/Behavioral: Negative.         She admits to having sleep issues. She is recently retired and previously worked 3rd shift.    Cardiac Risk Factors include: advanced age (>19mn, >>88women);obesity (BMI >30kg/m2)      Objective:    Today's Vitals   09/22/21 1419 09/22/21 1539  BP: (!) 142/80 130/84  Pulse: 93   Temp: 98.3 F (36.8 C)   TempSrc: Oral   Weight: 185 lb 3.2 oz (84 kg)   Height: 5' 2.2" (1.58 m)   PainSc: 0-No pain   Body mass index is 33.66 kg/m.  Medications Outpatient Encounter Medications as of 09/22/2021  Medication Sig   EPINEPHrine 0.3 mg/0.3 mL IJ SOAJ injection Inject 0.3 mg into the muscle as needed for anaphylaxis.   fluticasone (FLONASE) 50 MCG/ACT nasal spray SPRAY 1 SPRAY INTO BOTH NOSTRILS DAILY.   levocetirizine (XYZAL) 5 MG tablet TAKE 1 TABLET BY MOUTH EVERY DAY IN THE EVENING   levothyroxine (SYNTHROID) 100 MCG tablet Take one by tablet by mouth on Sundays   mometasone (ELOCON) 0.1 % cream Apply 1 application topically daily as needed for itch   nabumetone (RELAFEN) 750 MG tablet TAKE 1 TABLET BY MOUTH TWICE  DAILY WITH FOOD AS NEEDED FOR  MILD OR MODERATE PAIN   rosuvastatin (CRESTOR) 20 MG tablet One tab po daily on Monday-Saturday, skip Sundays   RYALTRIS 665-25 MCG/ACT SUSP Place 2 puffs into the nose daily as needed.   Vitamin D, Ergocalciferol, (DRISDOL) 1.25 MG (50000 UNIT) CAPS capsule TAKE 1 CAPSULE BY MOUTH ON TUESDAYS AND  FRIDAYS   [DISCONTINUED] levothyroxine (SYNTHROID) 88 MCG tablet Take one tablet by mouth daily Monday through saturday   [DISCONTINUED] rosuvastatin (CRESTOR) 10 MG tablet Take 1 tablet (10 mg total) by mouth daily.   ciprofloxacin-dexamethasone (CIPRODEX) OTIC suspension Instill 4 drops in both ears as needed for itching (Patient not taking: Reported on 09/22/2021)   ciprofloxacin-dexamethasone (CIPRODEX) OTIC suspension Place 4 drops into the left ear 2 (two) times daily. 4 drops twice a day in the left ear x5 days or until drainage stops. (Patient not taking: Reported on 09/22/2021)   [DISCONTINUED] benzonatate (TESSALON PERLES) 100 MG capsule Take 1 capsule (100 mg total) by mouth 3 (three) times daily as needed for cough. (Patient not taking: Reported on 07/30/2021)   [DISCONTINUED] Chlorphen-PE-Acetaminophen (NOREL AD) 4-10-325 MG TABS Take 1 tablet by mouth daily as needed. (Patient not taking: Reported on 09/22/2021)   No facility-administered encounter medications on file as of 09/22/2021.     History: Past Medical History:  Diagnosis Date   Allergy    Hypothyroidism    Seasonal allergies    Past Surgical History:  Procedure Laterality Date   BREAST BIOPSY Right    BREAST BIOPSY Right    BREAST EXCISIONAL BIOPSY Right    CESAREAN SECTION     TONSILLECTOMY      Family History  Problem Relation Age of Onset  Allergic rhinitis Mother    Breast cancer Cousin 57   Asthma Son    Social History   Occupational History   Not on file  Tobacco Use   Smoking status: Former    Packs/day: 0.50    Years: 44.00    Pack years: 22.00    Types: Cigarettes    Start date: 64    Quit date: 09/02/2020    Years since quitting: 1.1    Passive exposure: Never   Smokeless tobacco: Former   Tobacco comments:    Encouraged to cut back on number of cigs smoked  Vaping Use   Vaping Use: Never used  Substance and Sexual Activity   Alcohol use: Yes    Alcohol/week: 1.0 standard drink     Types: 1 Glasses of wine per week    Comment: 2 x a year   Drug use: No   Sexual activity: Not Currently    Tobacco Counseling Counseling given: Yes Tobacco comments: Encouraged to cut back on number of cigs smoked   Immunizations and Health Maintenance Immunization History  Administered Date(s) Administered   Influenza,inj,Quad PF,6+ Mos 02/20/2019   PFIZER(Purple Top)SARS-COV-2 Vaccination 07/21/2019, 08/11/2019, 03/15/2020   PNEUMOCOCCAL CONJUGATE-20 09/22/2021   Pfizer Covid-19 Vaccine Bivalent Booster 25yr & up 04/22/2021   Health Maintenance Due  Topic Date Due   Zoster Vaccines- Shingrix (1 of 2) Never done   PAP SMEAR-Modifier  10/28/2020    Activities of Daily Living    09/22/2021    2:58 PM  In your present state of health, do you have any difficulty performing the following activities:  Hearing? 0  Vision? 0  Difficulty concentrating or making decisions? 0  Walking or climbing stairs? 0  Dressing or bathing? 0  Doing errands, shopping? 0  Preparing Food and eating ? N  Using the Toilet? N  In the past six months, have you accidently leaked urine? N  Do you have problems with loss of bowel control? N  Managing your Medications? N  Managing your Finances? N  Housekeeping or managing your Housekeeping? N    Physical Exam   Physical Exam Vitals and nursing note reviewed.  Constitutional:      Appearance: Normal appearance.  HENT:     Head: Normocephalic and atraumatic.     Right Ear: Ear canal and external ear normal. There is no impacted cerumen.     Left Ear: Tympanic membrane, ear canal and external ear normal. There is no impacted cerumen.     Ears:     Comments: TM scarring on the right Eyes:     Extraocular Movements: Extraocular movements intact.     Conjunctiva/sclera: Conjunctivae normal.     Pupils: Pupils are equal, round, and reactive to light.  Cardiovascular:     Rate and Rhythm: Normal rate and regular rhythm.     Pulses: Normal  pulses.          Dorsalis pedis pulses are 2+ on the right side and 2+ on the left side.     Heart sounds: Normal heart sounds.  Pulmonary:     Effort: Pulmonary effort is normal.     Breath sounds: Normal breath sounds.  Chest:  Breasts:    Tanner Score is 5.     Comments: She elected to keep bra on, breast exam not performed. Abdominal:     General: Bowel sounds are normal.     Palpations: Abdomen is soft.  Musculoskeletal:  General: Normal range of motion.     Cervical back: Normal range of motion and neck supple.  Skin:    General: Skin is warm and dry.  Neurological:     General: No focal deficit present.     Mental Status: She is alert and oriented to person, place, and time.  Psychiatric:        Mood and Affect: Mood normal.        Behavior: Behavior normal.    Advanced Directives: Does Patient Have a Medical Advance Directive?: Yes Does patient want to make changes to medical advance directive?: Yes (MAU/Ambulatory/Procedural Areas - Information given)    Assessment:    This is a routine wellness examination for this patient .  Vision/Hearing screen Hearing Screening   500Hz  1000Hz  2000Hz  4000Hz   Right ear 40 40 nr nr  Left ear 40 40 nr nr   Vision Screening   Right eye Left eye Both eyes  Without correction 20/30 20/30 20/20   With correction       Dietary issues and exercise activities discussed:  Current Exercise Habits: Home exercise routine, Type of exercise: walking, Time (Minutes): 20, Frequency (Times/Week): 3, Weekly Exercise (Minutes/Week): 60, Intensity: Moderate, Exercise limited by: orthopedic condition(s) (r shoulder pain ?? rotator cuff injury)   Goals      Increase physical activity     Aim for at least 150 minutes of exercise per week       Depression Screen    09/22/2021    2:27 PM 09/22/2021    2:19 PM 03/09/2021    2:47 PM 02/26/2020    2:21 PM  PHQ 2/9 Scores  PHQ - 2 Score 0 0 0 0  PHQ- 9 Score    0     Fall Risk     09/22/2021    2:57 PM  Fall Risk   Falls in the past year? 1  Number falls in past yr: 0  Comment fell in bathroom while standing on stool, missed a step  Injury with Fall? 0    Cognitive Function:        09/22/2021    2:27 PM  6CIT Screen  What Year? 0 points  What month? 0 points  What time? 0 points  Count back from 20 0 points  Months in reverse 0 points  Repeat phrase 4 points  Total Score 4 points    Patient Care Team: Glendale Chard, MD as PCP - General (Internal Medicine) Jerline Pain, MD as PCP - Cardiology (Cardiology) Brien Few, MD as Consulting Physician (Obstetrics and Gynecology)     Plan:  1. Encounter for Medicare annual wellness exam The annual wellness visit was performed including discussion of advanced directives, assessment of functional status and cognitive function. EKG performed, . A full exam was also performed. She will rto in one year for AWV with [facility] Advisor.  PATIENT IS ADVISED TO GET 30-45 MINUTES REGULAR EXERCISE NO LESS THAN FOUR TO FIVE DAYS PER WEEK - BOTH WEIGHTBEARING EXERCISES AND AEROBIC ARE RECOMMENDED.  PATIENT IS ADVISED TO FOLLOW A HEALTHY DIET WITH AT LEAST SIX FRUITS/VEGGIES PER DAY, DECREASE INTAKE OF RED MEAT, AND TO INCREASE FISH INTAKE TO TWO DAYS PER WEEK.  MEATS/FISH SHOULD NOT BE FRIED, BAKED OR BROILED IS PREFERABLE.  IT IS ALSO IMPORTANT TO CUT BACK ON YOUR SUGAR INTAKE. PLEASE AVOID ANYTHING WITH ADDED SUGAR, CORN SYRUP OR OTHER SWEETENERS. IF YOU MUST USE A SWEETENER, YOU CAN TRY STEVIA. IT IS ALSO IMPORTANT  TO AVOID ARTIFICIALLY SWEETENERS AND DIET BEVERAGES. LASTLY, I SUGGEST WEARING SPF 50 SUNSCREEN ON EXPOSED PARTS AND ESPECIALLY WHEN IN THE DIRECT SUNLIGHT FOR AN EXTENDED PERIOD OF TIME.  PLEASE AVOID FAST FOOD RESTAURANTS AND INCREASE YOUR WATER INTAKE.    - POCT Urinalysis Dipstick (81002) - EKG 12-Lead  2. Primary hypothyroidism Comments: I will check thyroid panel and adjust meds as needed.  She will RTO in  six months for re-evaluation.  - CMP14+EGFR - CBC no Diff - TSH + free T4  3. Atherosclerosis of aorta (HCC) Comments: LDL goal <70. Admits to non-compliance with w/ rosuvastatin.  Importance of medication/dietary compliance was d/w patient.  - Lipid panel; Future - ALT; Future  4. Other abnormal glucose Comments: Her a1c has been elevated in the past. I will recheck an a1c today.  She is encouraged to limit her intake of sugary beverages and foods.   5. Elevated blood pressure reading Comments: She is encouraged to limit her intake of packaged/processed foods and to limit her sodium intake.  - CMP14+EGFR - Magnesium  6. Other insomnia Comments: She is having difficulty adjusting to retired life from 3rd shift. She will start magnesium glycinate, 143m nightly.  7. Abnormal urine Comments: Nitrites seen in u/a today. I will send for urine culture.  - Urine Culture  8. Class 1 obesity due to excess calories with serious comorbidity and body mass index (BMI) of 33.0 to 33.9 in adult Comments: She is encouraged to aim for at least 150 minutes of exercise per week, while striving for BMI<30 to decrease cardiac risk.   9. Drug therapy Comments: I will check a vitamin B12 level today.  - Vitamin B12  10. Immunization due Comments: She was given Prevnar-20 IM x 1.  - Pneumococcal conjugate vaccine 20-valent (Prevnar-20)   I have personally reviewed and noted the following in the patient's chart:   Medical and social history Use of alcohol, tobacco or illicit drugs  Current medications and supplements Functional ability and status Nutritional status Physical activity Advanced directives List of other physicians Hospitalizations, surgeries, and ER visits in previous 12 months Vitals Screenings to include cognitive, depression, and falls Referrals and appointments  In addition, I have reviewed and discussed with patient certain preventive protocols, quality metrics, and best  practice recommendations. A written personalized care plan for preventive services as well as general preventive health recommendations were provided to patient.     RMaximino Greenland MD 10/11/2021

## 2021-09-23 ENCOUNTER — Other Ambulatory Visit: Payer: Self-pay

## 2021-09-23 LAB — CMP14+EGFR
ALT: 13 IU/L (ref 0–32)
AST: 20 IU/L (ref 0–40)
Albumin/Globulin Ratio: 1.7 (ref 1.2–2.2)
Albumin: 4.6 g/dL (ref 3.8–4.8)
Alkaline Phosphatase: 98 IU/L (ref 44–121)
BUN/Creatinine Ratio: 17 (ref 12–28)
BUN: 16 mg/dL (ref 8–27)
Bilirubin Total: 0.5 mg/dL (ref 0.0–1.2)
CO2: 23 mmol/L (ref 20–29)
Calcium: 9.4 mg/dL (ref 8.7–10.3)
Chloride: 102 mmol/L (ref 96–106)
Creatinine, Ser: 0.93 mg/dL (ref 0.57–1.00)
Globulin, Total: 2.7 g/dL (ref 1.5–4.5)
Glucose: 86 mg/dL (ref 70–99)
Potassium: 4.1 mmol/L (ref 3.5–5.2)
Sodium: 141 mmol/L (ref 134–144)
Total Protein: 7.3 g/dL (ref 6.0–8.5)
eGFR: 68 mL/min/{1.73_m2} (ref >59)

## 2021-09-23 LAB — CBC
Hematocrit: 35 % (ref 34.0–46.6)
Hemoglobin: 12.2 g/dL (ref 11.1–15.9)
MCH: 31.1 pg (ref 26.6–33.0)
MCHC: 34.9 g/dL (ref 31.5–35.7)
MCV: 89 fL (ref 79–97)
Platelets: 229 10*3/uL (ref 150–450)
RBC: 3.92 x10E6/uL (ref 3.77–5.28)
RDW: 14.2 % (ref 11.7–15.4)
WBC: 6.7 10*3/uL (ref 3.4–10.8)

## 2021-09-23 LAB — TSH+FREE T4
Free T4: 0.99 ng/dL (ref 0.82–1.77)
TSH: 13.1 u[IU]/mL — ABNORMAL HIGH (ref 0.450–4.500)

## 2021-09-23 LAB — MAGNESIUM: Magnesium: 2.5 mg/dL — ABNORMAL HIGH (ref 1.6–2.3)

## 2021-09-23 LAB — VITAMIN B12: Vitamin B-12: 518 pg/mL (ref 232–1245)

## 2021-09-24 ENCOUNTER — Other Ambulatory Visit: Payer: Self-pay

## 2021-09-25 ENCOUNTER — Other Ambulatory Visit: Payer: Self-pay | Admitting: Internal Medicine

## 2021-09-25 ENCOUNTER — Other Ambulatory Visit: Payer: Self-pay

## 2021-09-25 LAB — URINE CULTURE

## 2021-09-25 MED ORDER — LEVOTHYROXINE SODIUM 88 MCG PO TABS
ORAL_TABLET | ORAL | 2 refills | Status: DC
  Filled 2021-09-25: qty 52, 60d supply, fill #0
  Filled 2021-12-10: qty 52, 60d supply, fill #1
  Filled 2022-02-22: qty 52, 60d supply, fill #2

## 2021-09-25 MED ORDER — NITROFURANTOIN MONOHYD MACRO 100 MG PO CAPS: 100.0000 mg | ORAL_CAPSULE | Freq: Two times a day (BID) | ORAL | 0 refills | Status: DC

## 2021-09-28 ENCOUNTER — Other Ambulatory Visit: Payer: Self-pay

## 2021-09-29 ENCOUNTER — Other Ambulatory Visit: Payer: Self-pay

## 2021-09-29 ENCOUNTER — Other Ambulatory Visit (HOSPITAL_COMMUNITY): Payer: Self-pay

## 2021-09-29 MED ORDER — NITROFURANTOIN MONOHYD MACRO 100 MG PO CAPS
100.0000 mg | ORAL_CAPSULE | Freq: Two times a day (BID) | ORAL | 0 refills | Status: AC
  Filled 2021-09-29: qty 10, 5d supply, fill #0

## 2021-10-21 ENCOUNTER — Other Ambulatory Visit: Payer: Self-pay | Admitting: Internal Medicine

## 2021-10-23 ENCOUNTER — Other Ambulatory Visit: Payer: Self-pay

## 2021-10-23 ENCOUNTER — Other Ambulatory Visit: Payer: Self-pay | Admitting: Internal Medicine

## 2021-10-26 ENCOUNTER — Other Ambulatory Visit: Payer: Self-pay

## 2021-11-03 ENCOUNTER — Other Ambulatory Visit: Payer: PPO

## 2021-11-04 ENCOUNTER — Other Ambulatory Visit: Payer: PPO

## 2021-11-04 ENCOUNTER — Other Ambulatory Visit: Payer: Self-pay | Admitting: Internal Medicine

## 2021-11-04 ENCOUNTER — Other Ambulatory Visit: Payer: Self-pay

## 2021-11-04 DIAGNOSIS — I700 Atherosclerosis of aorta: Secondary | ICD-10-CM

## 2021-11-05 ENCOUNTER — Other Ambulatory Visit: Payer: Self-pay

## 2021-11-05 LAB — LIPID PANEL
Chol/HDL Ratio: 2 ratio (ref 0.0–4.4)
Cholesterol, Total: 144 mg/dL (ref 100–199)
HDL: 73 mg/dL (ref >39)
LDL Chol Calc (NIH): 58 mg/dL (ref 0–99)
Triglycerides: 62 mg/dL (ref 0–149)
VLDL Cholesterol Cal: 13 mg/dL (ref 5–40)

## 2021-11-05 LAB — ALT: ALT: 21 IU/L (ref 0–32)

## 2021-11-06 ENCOUNTER — Other Ambulatory Visit: Payer: Self-pay

## 2021-11-13 ENCOUNTER — Other Ambulatory Visit: Payer: Self-pay

## 2021-11-16 ENCOUNTER — Encounter: Payer: Self-pay | Admitting: Internal Medicine

## 2021-11-16 ENCOUNTER — Other Ambulatory Visit: Payer: Self-pay

## 2021-11-16 MED ORDER — LEVOCETIRIZINE DIHYDROCHLORIDE 5 MG PO TABS
ORAL_TABLET | ORAL | 1 refills | Status: DC
  Filled 2021-11-16: qty 90, 90d supply, fill #0
  Filled 2022-02-22: qty 90, 90d supply, fill #1

## 2021-11-18 ENCOUNTER — Other Ambulatory Visit: Payer: Self-pay

## 2021-12-10 ENCOUNTER — Other Ambulatory Visit: Payer: Self-pay

## 2021-12-14 ENCOUNTER — Other Ambulatory Visit: Payer: Self-pay

## 2021-12-17 ENCOUNTER — Other Ambulatory Visit: Payer: Self-pay

## 2022-01-19 ENCOUNTER — Encounter: Payer: Self-pay | Admitting: Cardiology

## 2022-02-09 ENCOUNTER — Ambulatory Visit: Payer: PPO | Attending: Cardiology | Admitting: Cardiology

## 2022-02-09 ENCOUNTER — Encounter: Payer: Self-pay | Admitting: Cardiology

## 2022-02-09 VITALS — BP 120/80 | HR 95 | Ht 62.0 in | Wt 184.0 lb

## 2022-02-09 DIAGNOSIS — I700 Atherosclerosis of aorta: Secondary | ICD-10-CM

## 2022-02-09 DIAGNOSIS — I2510 Atherosclerotic heart disease of native coronary artery without angina pectoris: Secondary | ICD-10-CM

## 2022-02-09 DIAGNOSIS — I2584 Coronary atherosclerosis due to calcified coronary lesion: Secondary | ICD-10-CM

## 2022-02-09 DIAGNOSIS — E7800 Pure hypercholesterolemia, unspecified: Secondary | ICD-10-CM

## 2022-02-09 NOTE — Patient Instructions (Signed)
Medication Instructions:  The current medical regimen is effective;  continue present plan and medications.  *If you need a refill on your cardiac medications before your next appointment, please call your pharmacy*  Follow-Up: At [facility], you and your health needs are our priority.  As part of our continuing mission to provide you with exceptional heart care, we have created designated Provider Care Teams.  These Care Teams include your primary Cardiologist (physician) and Advanced Practice Providers (APPs -  Physician Assistants and Nurse Practitioners) who all work together to provide you with the care you need, when you need it.  We recommend signing up for the patient portal called "MyChart".  Sign up information is provided on this After Visit Summary.  MyChart is used to connect with patients for Virtual Visits (Telemedicine).  Patients are able to view lab/test results, encounter notes, upcoming appointments, etc.  Non-urgent messages can be sent to your provider as well.   To learn more about what you can do with MyChart, go to [email or web address].    Your next appointment:   1 year(s)  The format for your next appointment:   In Person  Provider:   Mark Skains, MD      Important Information About Sugar       

## 2022-02-09 NOTE — Progress Notes (Signed)
Cardiology Office Note:    Date:  02/09/2022   ID:  Anita Duncan, DOB [birth year], MRN [id number]  PCP:  Glendale Chard, MD   [facility] Providers Cardiologist:  Candee Furbish, MD     Referring MD: Glendale Chard, MD     History of Present Illness:    Anita Duncan is a 66 y.o. female here for follow-up of family history of coronary artery disease and hyperlipidemia   She reported that her father died at age 33 with MI and her sister, at age 39 was diagnosed with a hole in the back of her heart   At her previous visit 09/29/2020 she had a prior LDL 131, now 41.  She reported that she quit tobacco a year and a half ago, after her allergies tipped her over the edge with all of her mucus production etc.  She had been battling ear pain, eustachian tube occlusion. She had a ruptured eardrum that took about 3 months to heal.    Her mother, Anita Duncan who I take care of is well with moderate to severe mitral regurgitation, she ended up having a CT scan in preparation for watchman device with Dr. Quentin Ore.  She had a reaction to the IV dye she believes.  Her daughter said that her skin has been peeling quite diffusely.  She spoke to our CT nurse coordinators regarding this matter. Records reviewed. Encouraged her to talk to Dr. Baird Cancer.  I wonder if she needs an allergist. She has been told not to take Benadryl because of her recent glaucoma eye surgery.  She reports that her son has lupus.  He owns a gym and maintains a vegan diet. She is an only child.  Today she reports that she is doing well. She has been taking her rosuvastatin with no issues. She has started her exercise routine again and is trying to monitor her diet. She does have an old injury to her right arm that occasionally bothers her. Soon she hopes to start playing tennis to get more active. She is taking care of her mother and her son, who has lupus. He has been doing well with his treatment.   She denies any  palpitations, chest pain, shortness of breath, or peripheral edema. No lightheadedness, headaches, syncope, orthopnea, or PND.    Past Medical History:  Diagnosis Date   Allergy    Hypothyroidism    Seasonal allergies     Past Surgical History:  Procedure Laterality Date   BREAST BIOPSY Right    BREAST BIOPSY Right    BREAST EXCISIONAL BIOPSY Right    CESAREAN SECTION     TONSILLECTOMY      Current Medications: Current Meds  Medication Sig   ciprofloxacin-dexamethasone (CIPRODEX) OTIC suspension Instill 4 drops in both ears as needed for itching   EPINEPHrine 0.3 mg/0.3 mL IJ SOAJ injection Inject 0.3 mg into the muscle as needed for anaphylaxis.   fluticasone (FLONASE) 50 MCG/ACT nasal spray SPRAY 1 SPRAY INTO BOTH NOSTRILS DAILY.   levocetirizine (XYZAL) 5 MG tablet TAKE 1 TABLET BY MOUTH EVERY DAY IN THE EVENING   levothyroxine (SYNTHROID) 100 MCG tablet Take one by tablet by mouth on Sundays   levothyroxine (SYNTHROID) 88 MCG tablet Take one tablet by mouth daily Monday through saturday   mometasone (ELOCON) 0.1 % cream Apply 1 application topically daily as needed for itch   nabumetone (RELAFEN) 750 MG tablet TAKE 1 TABLET BY MOUTH TWICE  DAILY WITH FOOD AS NEEDED  FOR  MILD OR MODERATE PAIN   rosuvastatin (CRESTOR) 20 MG tablet One tab po daily on Monday-Saturday, skip Sundays   RYALTRIS 665-25 MCG/ACT SUSP Place 2 puffs into the nose daily as needed.   Vitamin D, Ergocalciferol, (DRISDOL) 1.25 MG (50000 UNIT) CAPS capsule TAKE 1 CAPSULE BY MOUTH ON TUESDAYS AND FRIDAYS     Allergies:   Amoxicillin, Ceftin [cefuroxime], Codeine, and Sulfa antibiotics   Social History   Socioeconomic History   Marital status: Single    Spouse name: Not on file   Number of children: Not on file   Years of education: Not on file   Highest education level: Not on file  Occupational History   Not on file  Tobacco Use   Smoking status: Former    Packs/day: 0.50    Years: 44.00     Total pack years: 22.00    Types: Cigarettes    Start date: 42    Quit date: 09/02/2020    Years since quitting: 1.4    Passive exposure: Never   Smokeless tobacco: Former   Tobacco comments:    Encouraged to cut back on number of cigs smoked  Vaping Use   Vaping Use: Never used  Substance and Sexual Activity   Alcohol use: Yes    Alcohol/week: 1.0 standard drink of alcohol    Types: 1 Glasses of wine per week    Comment: 2 x a year   Drug use: No   Sexual activity: Not Currently  Other Topics Concern   Not on file  Social History Narrative   Not on file   Social Determinants of Health   Financial Resource Strain: Low Risk  (09/22/2021)   Overall Financial Resource Strain (CARDIA)    Difficulty of Paying Living Expenses: Not hard at all  Food Insecurity: No Food Insecurity (09/22/2021)   Hunger Vital Sign    Worried About Running Out of Food in the Last Year: Never true    Ran Out of Food in the Last Year: Never true  Transportation Needs: No Transportation Needs (09/22/2021)   PRAPARE - Administrator, Civil Service (Medical): No    Lack of Transportation (Non-Medical): No  Physical Activity: Insufficiently Active (09/22/2021)   Exercise Vital Sign    Days of Exercise per Week: 3 days    Minutes of Exercise per Session: 20 min  Stress: Stress Concern Present (09/22/2021)   [employer] of Occupational Health - Occupational Stress Questionnaire    Feeling of Stress : To some extent  Social Connections: Moderately Isolated (09/22/2021)   Social Connection and Isolation Panel [NHANES]    Frequency of Communication with Friends and Family: More than three times a week    Frequency of Social Gatherings with Friends and Family: Twice a week    Attends Religious Services: More than 4 times per year    Active Member of [employer] or Organizations: No    Attends Banker Meetings: Never    Marital Status: Never married     Family History: The patient's family  history includes Allergic rhinitis in her mother; Asthma in her son; Breast cancer (age of onset: 43) in her cousin.  ROS:   Please see the history of present illness.   (+) Right arm tendonitis  All other systems reviewed and are negative.  EKGs/Labs/Other Studies Reviewed:    [facility] 03/17/2020:  IMPRESSIONS    1. Left ventricular ejection fraction, by estimation, is 60 to 65%. The  left ventricle has normal function. The left ventricle has no regional  wall motion abnormalities. There is moderate concentric left ventricular  hypertrophy. Left ventricular  diastolic parameters are indeterminate.   2. Right ventricular systolic function is normal. The right ventricular  size is normal. Tricuspid regurgitation signal is inadequate for assessing  PA pressure.   3. Left atrial size was mild to moderately dilated.   4. The mitral valve is normal in structure. Trivial mitral valve  regurgitation. No evidence of mitral stenosis.   5. The aortic valve is tricuspid. There is mild calcification of the  aortic valve. Aortic valve regurgitation is not visualized. Mild aortic  valve sclerosis is present, with no evidence of aortic valve stenosis.   6. The inferior vena cava is normal in size with greater than 50%  respiratory variability, suggesting right atrial pressure of 3 mmHg.   EKG: EKG is personally reviewed.  02/09/2022: EKG was not ordered.  09/22/21:Sinus rhythm, heart rate 84 bpm.  Recent Labs: 09/22/2021: BUN 16; Creatinine, Ser 0.93; Hemoglobin 12.2; Magnesium 2.5; Platelets 229; Potassium 4.1; Sodium 141; TSH 13.100 11/04/2021: ALT 21  Recent Lipid Panel    Component Value Date/Time   CHOL 144 11/04/2021 1414   TRIG 62 11/04/2021 1414   HDL 73 11/04/2021 1414   CHOLHDL 2.0 11/04/2021 1414   LDLCALC 58 11/04/2021 1414     Risk Assessment/Calculations:      Physical Exam:    VS:  BP 120/80 (BP Location: Left Arm, Patient Position: Sitting, Cuff Size: Normal)   Pulse 95    Ht 5\' 2"  (1.575 m)   Wt 184 lb (83.5 kg)   SpO2 95%   BMI 33.65 kg/m     Wt Readings from Last 3 Encounters:  02/09/22 184 lb (83.5 kg)  09/22/21 185 lb 3.2 oz (84 kg)  07/30/21 186 lb (84.4 kg)     GEN:  Well nourished, well developed in no acute distress HEENT: Normal NECK: No JVD; No carotid bruits LYMPHATICS: No lymphadenopathy CARDIAC: RRR, no murmurs, rubs, gallops RESPIRATORY:  Clear to auscultation without rales, wheezing or rhonchi  ABDOMEN: Soft, non-tender, non-distended MUSCULOSKELETAL:  No edema; No deformity  SKIN: Warm and dry NEUROLOGIC:  Alert and oriented x 3 PSYCHIATRIC:  Normal affect   ASSESSMENT:    1. Coronary artery calcification   2. Pure hypercholesterolemia   3. Atherosclerosis of aorta (HCC)     PLAN:    In order of problems listed above:  Coronary artery calcification/atherosclerosis - Doing very well with rosuvastatin 20 mg once a day.  No myalgias.  Continue with current prescription drug management.  Refills as needed.  Her LDL is now down to 58.  Great for plaque stabilization.  Hyperlipidemia - Excellent reduction in LDL with rosuvastatin 20 mg once a day.  LDL now 58.  Triglycerides 40.  HDL 62.  ALT is 14.  Tobacco cessation - Excellent job.  She quit smoking now for over a year and a half.  Follow Up: 1 year follow up.   Medication Adjustments/Labs and Tests Ordered: Current medicines are reviewed at length with the patient today.  Concerns regarding medicines are outlined above.  No orders of the defined types were placed in this encounter.  No orders of the defined types were placed in this encounter.   Patient Instructions  Medication Instructions:  The current medical regimen is effective;  continue present plan and medications.  *If you need a refill on your cardiac medications before your  next appointment, please call your pharmacy*   Follow-Up: At [facility], you and your health needs are our  priority.  As part of our continuing mission to provide you with exceptional heart care, we have created designated Provider Care Teams.  These Care Teams include your primary Cardiologist (physician) and Advanced Practice Providers (APPs -  Physician Assistants and Nurse Practitioners) who all work together to provide you with the care you need, when you need it.  We recommend signing up for the patient portal called "MyChart".  Sign up information is provided on this After Visit Summary.  MyChart is used to connect with patients for Virtual Visits (Telemedicine).  Patients are able to view lab/test results, encounter notes, upcoming appointments, etc.  Non-urgent messages can be sent to your provider as well.   To learn more about what you can do with MyChart, go to [email or web address].    Your next appointment:   1 year(s)  The format for your next appointment:   In Person  Provider:   Donato Schultz, MD      Important Information About Sugar          Jodelle Gross Ford,acting as a scribe for Donato Schultz, MD.,have documented all relevant documentation on the behalf of Donato Schultz, MD,as directed by  Donato Schultz, MD while in the presence of Donato Schultz, MD.   I, Donato Schultz, MD, have reviewed all documentation for this visit. The documentation on 02/09/22 for the exam, diagnosis, procedures, and orders are all accurate and complete.   Signed, Donato Schultz, MD  02/09/2022 10:20 AM    [facility]

## 2022-02-15 DIAGNOSIS — R87610 Atypical squamous cells of undetermined significance on cytologic smear of cervix (ASC-US): Secondary | ICD-10-CM | POA: Diagnosis not present

## 2022-02-15 DIAGNOSIS — Z124 Encounter for screening for malignant neoplasm of cervix: Secondary | ICD-10-CM | POA: Diagnosis not present

## 2022-02-15 DIAGNOSIS — Z01419 Encounter for gynecological examination (general) (routine) without abnormal findings: Secondary | ICD-10-CM | POA: Diagnosis not present

## 2022-02-15 DIAGNOSIS — R87810 Cervical high risk human papillomavirus (HPV) DNA test positive: Secondary | ICD-10-CM | POA: Diagnosis not present

## 2022-02-15 DIAGNOSIS — Z6833 Body mass index (BMI) 33.0-33.9, adult: Secondary | ICD-10-CM | POA: Diagnosis not present

## 2022-02-15 DIAGNOSIS — Z01411 Encounter for gynecological examination (general) (routine) with abnormal findings: Secondary | ICD-10-CM | POA: Diagnosis not present

## 2022-02-20 LAB — HM PAP SMEAR

## 2022-02-22 ENCOUNTER — Other Ambulatory Visit: Payer: Self-pay

## 2022-03-09 ENCOUNTER — Other Ambulatory Visit: Payer: Self-pay

## 2022-03-11 ENCOUNTER — Ambulatory Visit: Payer: Federal, State, Local not specified - PPO | Admitting: Internal Medicine

## 2022-03-22 ENCOUNTER — Ambulatory Visit (INDEPENDENT_AMBULATORY_CARE_PROVIDER_SITE_OTHER): Payer: PPO | Admitting: Internal Medicine

## 2022-03-22 ENCOUNTER — Encounter: Payer: Self-pay | Admitting: Internal Medicine

## 2022-03-22 VITALS — BP 140/98 | HR 87 | Temp 98.0°F | Ht 62.2 in | Wt 182.8 lb

## 2022-03-22 DIAGNOSIS — R030 Elevated blood-pressure reading, without diagnosis of hypertension: Secondary | ICD-10-CM

## 2022-03-22 DIAGNOSIS — I700 Atherosclerosis of aorta: Secondary | ICD-10-CM | POA: Diagnosis not present

## 2022-03-22 DIAGNOSIS — Z87891 Personal history of nicotine dependence: Secondary | ICD-10-CM | POA: Diagnosis not present

## 2022-03-22 DIAGNOSIS — Z2821 Immunization not carried out because of patient refusal: Secondary | ICD-10-CM | POA: Diagnosis not present

## 2022-03-22 DIAGNOSIS — Z6833 Body mass index (BMI) 33.0-33.9, adult: Secondary | ICD-10-CM | POA: Diagnosis not present

## 2022-03-22 DIAGNOSIS — R7309 Other abnormal glucose: Secondary | ICD-10-CM | POA: Diagnosis not present

## 2022-03-22 DIAGNOSIS — E559 Vitamin D deficiency, unspecified: Secondary | ICD-10-CM

## 2022-03-22 DIAGNOSIS — E66811 Obesity, class 1: Secondary | ICD-10-CM

## 2022-03-22 DIAGNOSIS — E039 Hypothyroidism, unspecified: Secondary | ICD-10-CM

## 2022-03-22 DIAGNOSIS — E6609 Other obesity due to excess calories: Secondary | ICD-10-CM | POA: Diagnosis not present

## 2022-03-22 NOTE — Progress Notes (Signed)
Rich Brave [place]leton,acting as a Education administrator for Maximino Greenland, MD.,have documented all relevant documentation on the behalf of Maximino Greenland, MD,as directed by  Maximino Greenland, MD while in the presence of Maximino Greenland, MD.    Subjective:     Patient ID: Anita Duncan , female    DOB: 07/21/1955 , 66 y.o.   MRN: [id number]   Chief Complaint  Patient presents with   Hypothyroidism    HPI  Patient presents today for a thyroid check. She is currently taking Synthroid 11mg daily M-Sat and 1057m on Sundays. She reports compliance with medications.  She is the primary caregiver for her mother, this has been stressful for her recently.   Hyperlipidemia This is a chronic problem. The current episode started more than 1 year ago. The problem is controlled. Exacerbating diseases include hypothyroidism. She has no history of chronic renal disease.     Past Medical History:  Diagnosis Date   Allergy    Hypothyroidism    Seasonal allergies      Family History  Problem Relation Age of Onset   Allergic rhinitis Mother    Breast cancer Cousin [id number] Asthma Son      Current Outpatient Medications:    ciprofloxacin-dexamethasone (CIPRODEX) OTIC suspension, Instill 4 drops in both ears as needed for itching, Disp: 7.5 mL, Rfl: 5   EPINEPHrine 0.3 mg/0.3 mL IJ SOAJ injection, Inject 0.3 mg into the muscle as needed for anaphylaxis., Disp: 1 each, Rfl: 1   fluticasone (FLONASE) 50 MCG/ACT nasal spray, SPRAY 1 SPRAY INTO BOTH NOSTRILS DAILY., Disp: 48 mL, Rfl: 1   levocetirizine (XYZAL) 5 MG tablet, TAKE 1 TABLET BY MOUTH EVERY DAY IN THE EVENING, Disp: 90 tablet, Rfl: 1   levothyroxine (SYNTHROID) 100 MCG tablet, Take one by tablet by mouth on Sundays, Disp: 39 tablet, Rfl: 1   levothyroxine (SYNTHROID) 88 MCG tablet, Take one tablet by mouth daily Monday through saturday, Disp: 52 tablet, Rfl: 2   mometasone (ELOCON) 0.1 % cream, Apply 1 application topically daily as needed for itch,  Disp: 15 g, Rfl: 5   nabumetone (RELAFEN) 750 MG tablet, TAKE 1 TABLET BY MOUTH TWICE  DAILY WITH FOOD AS NEEDED FOR  MILD OR MODERATE PAIN, Disp: 180 tablet, Rfl: 3   rosuvastatin (CRESTOR) 20 MG tablet, One tab po daily on Monday-Saturday, skip Sundays, Disp: 75 tablet, Rfl: 1   RYALTRIS 665-25 MCG/ACT SUSP, Place 2 puffs into the nose daily as needed., Disp: 29 g, Rfl: 5   Vitamin D, Ergocalciferol, (DRISDOL) 1.25 MG (50000 UNIT) CAPS capsule, TAKE 1 CAPSULE BY MOUTH ON TUESDAYS AND FRIDAYS, Disp: 24 capsule, Rfl: 0   Allergies  Allergen Reactions   Amoxicillin Itching   Ceftin [Cefuroxime] Itching   Codeine Nausea Only   Sulfa Antibiotics Nausea And Vomiting     Review of Systems  Constitutional: Negative.   HENT: Negative.    Eyes: Negative.   Respiratory: Negative.    Cardiovascular: Negative.   Gastrointestinal: Negative.      Today's Vitals   03/22/22 1202 03/22/22 1246  BP: (!) 140/100 (!) 140/98  Pulse: 87   Temp: 98 F (36.7 C)   Weight: 182 lb 12.8 oz (82.9 kg)   Height: 5' 2.2" (1.58 m)    Body mass index is 33.22 kg/m.  Wt Readings from Last 3 Encounters:  03/22/22 182 lb 12.8 oz (82.9 kg)  02/09/22 184 lb (83.5 kg)  09/22/21 185 lb 3.2  oz (84 kg)    BP Readings from Last 3 Encounters:  03/22/22 (!) 140/98  02/09/22 120/80  09/22/21 130/84    Objective:  Physical Exam Vitals and nursing note reviewed.  Constitutional:      Appearance: Normal appearance.  HENT:     Head: Normocephalic and atraumatic.     Nose:     Comments: Masked     Mouth/Throat:     Comments: Masked  Eyes:     Extraocular Movements: Extraocular movements intact.  Cardiovascular:     Rate and Rhythm: Normal rate and regular rhythm.     Heart sounds: Normal heart sounds.  Pulmonary:     Effort: Pulmonary effort is normal.     Breath sounds: Normal breath sounds.  Musculoskeletal:     Cervical back: Normal range of motion.  Skin:    General: Skin is warm.  Neurological:      General: No focal deficit present.     Mental Status: She is alert.  Psychiatric:        Mood and Affect: Mood normal.        Behavior: Behavior normal.         Assessment And Plan:     1. Primary hypothyroidism Comments: Chronic, I will check thyroid panel and adjust meds as needed.  She will rto in six months for a full physical examination . - TSH + free T4  2. Elevated blood pressure reading Comments: Likely exacerbated by stress. I will have her return for a nurse visit in 3 weeks, advised to follow low sodium diet. - Amb Referral To Provider Referral Exercise Program (P.R.E.P) - CMP14+EGFR  3. Atherosclerosis of aorta (HCC) Comments: Chronic, LDL goal <70. She is now on statin therapy. She will c/w rosuvastatin 59m daily. - Amb Referral To Provider Referral Exercise Program (P.R.E.P)  4. Other abnormal glucose Comments: Her a1c has been elevated in the past, I will recheck this today. She is encouraged to limit her intake of sugary foods and beverages. - Amb Referral To Provider Referral Exercise Program (P.R.E.P) - Hemoglobin A1c  5. Vitamin D deficiency disease Comments: I will check a vitamin D level and supplement as needed. - Vitamin D (25 hydroxy)  6. History of tobacco use Comments: I will refer her for repeat low dose chest CT. Previous study, performed in 2021, results reviewed in full detail. - CT CHEST LUNG CA SCREEN LOW DOSE W/O CM; Future  7. Class 1 obesity due to excess calories with serious comorbidity and body mass index (BMI) of 33.0 to 33.9 in adult Comments: She is encouraged to incorporate at least 150 minutes of exercise per week, while striving for BMI<30 to decrease cardiac risk. Will refer her to PREP program.  8. Influenza vaccination declined  9. Herpes zoster vaccination declined   Patient was given opportunity to ask questions. Patient verbalized understanding of the plan and was able to repeat key elements of the plan. All  questions were answered to their satisfaction.   I, RMaximino Greenland MD, have reviewed all documentation for this visit. The documentation on 03/22/22 for the exam, diagnosis, procedures, and orders are all accurate and complete.   IF YOU HAVE BEEN REFERRED TO A SPECIALIST, IT MAY TAKE 1-2 WEEKS TO SCHEDULE/PROCESS THE REFERRAL. IF YOU HAVE NOT HEARD FROM US/SPECIALIST IN TWO WEEKS, PLEASE GIVE [place] CALL AT [phone].   THE PATIENT IS ENCOURAGED TO PRACTICE SOCIAL DISTANCING DUE TO THE COVID-19 PANDEMIC.

## 2022-03-22 NOTE — Patient Instructions (Signed)
Hyperthyroidism Hyperthyroidism refers to a thyroid gland that is too active or overactive. The thyroid gland is a small gland located in the lower front part of the neck, just in front of the windpipe (trachea). This gland makes hormones that: Help control how the body uses food for energy (metabolism). Help the heart and brain work well. Keep your bones strong. When the thyroid is overactive, it produces too much of a hormone called thyroxine. What are the causes? This condition may be caused by: Graves' disease. This is a disorder in which the body's disease-fighting system (immune system) attacks the thyroid gland. This is the most common cause. Inflammation of the thyroid gland. A tumor in the thyroid gland. Use of certain medicines, including: Prescription thyroid hormone replacement. Herbal supplements that mimic thyroid hormones. Amiodarone therapy. Solid or fluid-filled lumps within your thyroid gland (thyroid nodules). Taking in a large amount of iodine from foods or medicines. What increases the risk? You are more likely to develop this condition if: You are female. You have a family history of thyroid conditions. You smoke tobacco. You use a medicine called lithium. You take medicines that affect the immune system (immunosuppressants). What are the signs or symptoms? Symptoms of this condition include: Nervousness. Inability to tolerate heat. Diarrhea. Rapid heart rate. Shaky hands. Restlessness. Sleep problems. Other symptoms may include: Heart skipping beats or making extra beats. Unexplained weight loss. Change in the texture of hair or skin. Loss of menstruation. Fatigue. Enlarged thyroid gland or a lump in the thyroid (nodule). You may also have symptoms of Graves' disease, which may include: Protruding eyes. Dry eyes. Red or swollen eyes. Problems with vision. How is this diagnosed? This condition may be diagnosed based on: Your symptoms and medical  history. A physical exam. Blood tests. Thyroid ultrasound. This test involves using sound waves to produce images of the thyroid gland. A thyroid scan. A radioactive substance is injected into a vein, and images show how much iodine is present in the thyroid. Radioactive iodine uptake test (RAIU). A small amount of radioactive iodine is given by mouth to see how much iodine the thyroid absorbs after a certain amount of time. How is this treated? Treatment depends on the cause and severity of the condition. Treatment may include: Medicines to reduce the amount of thyroid hormone your body makes. Medicines to help manage your symptoms. Radioactive iodine treatment (radioiodine therapy). This involves swallowing a small dose of radioactive iodine, in capsule or liquid form, to kill thyroid cells. Surgery to remove part or all of your thyroid gland. You may need to take thyroid hormone replacement medicine for the rest of your life after thyroid surgery. Follow these instructions at home:  Take over-the-counter and prescription medicines only as told by your health care provider. Do not use any products that contain nicotine or tobacco. These products include cigarettes, chewing tobacco, and vaping devices, such as e-cigarettes. If you need help quitting, ask your health care provider. Follow any instructions from your health care provider about diet. You may be instructed to limit foods that contain iodine. Keep all follow-up visits. You will need to have blood tests regularly so that your health care provider can monitor your condition. Where to find more information National Institute of Diabetes and Digestive and Kidney Diseases: niddk.nih.gov Contact a health care provider if: Your symptoms do not get better with treatment. You have a fever. You have abdominal pain. You feel dizzy. You are taking thyroid hormone replacement medicine and: You have   symptoms of depression. You feel like you  are tired all the time. You gain weight. Get help right away if: You have sudden, unexplained confusion or other mental changes. You have chest pain. You have fast or irregular heartbeats (palpitations). You have difficulty breathing. These symptoms may be an emergency. Get help right away. Call 911. Do not wait to see if the symptoms will go away. Do not drive yourself to the hospital. Summary The thyroid gland is a small gland located in the lower front part of the neck, just in front of the windpipe. Hyperthyroidism is when the thyroid gland is too active and produces too much of a hormone called thyroxine. The most common cause is Graves' disease, a disorder in which your immune system attacks the thyroid gland. Hyperthyroidism can cause various symptoms, such as unexplained weight loss, nervousness, inability to tolerate heat, or changes in your heartbeat. Treatment may include medicine to reduce the amount of thyroid hormone your body makes, radioiodine therapy, surgery, or medicines to manage symptoms. This information is not intended to replace advice given to you by your health care provider. Make sure you discuss any questions you have with your health care provider. Document Revised: 06/26/2021 Document Reviewed: 06/26/2021 Elsevier Patient Education  2023 Elsevier Inc.  

## 2022-03-23 LAB — CMP14+EGFR
ALT: 12 IU/L (ref 0–32)
AST: 14 IU/L (ref 0–40)
Albumin/Globulin Ratio: 1.4 (ref 1.2–2.2)
Albumin: 4.1 g/dL (ref 3.9–4.9)
Alkaline Phosphatase: 93 IU/L (ref 44–121)
BUN/Creatinine Ratio: 18 (ref 12–28)
BUN: 13 mg/dL (ref 8–27)
Bilirubin Total: 0.4 mg/dL (ref 0.0–1.2)
CO2: 24 mmol/L (ref 20–29)
Calcium: 9.3 mg/dL (ref 8.7–10.3)
Chloride: 105 mmol/L (ref 96–106)
Creatinine, Ser: 0.74 mg/dL (ref 0.57–1.00)
Globulin, Total: 2.9 g/dL (ref 1.5–4.5)
Glucose: 100 mg/dL — ABNORMAL HIGH (ref 70–99)
Potassium: 4.4 mmol/L (ref 3.5–5.2)
Sodium: 142 mmol/L (ref 134–144)
Total Protein: 7 g/dL (ref 6.0–8.5)
eGFR: 90 mL/min/{1.73_m2} (ref >59)

## 2022-03-23 LAB — HEMOGLOBIN A1C
Est. average glucose Bld gHb Est-mCnc: 131 mg/dL
Hgb A1c MFr Bld: 6.2 % — ABNORMAL HIGH (ref 4.8–5.6)

## 2022-03-23 LAB — VITAMIN D 25 HYDROXY (VIT D DEFICIENCY, FRACTURES): Vit D, 25-Hydroxy: 59.7 ng/mL (ref 30.0–100.0)

## 2022-03-23 LAB — TSH+FREE T4
Free T4: 1.78 ng/dL — ABNORMAL HIGH (ref 0.82–1.77)
TSH: 0.033 u[IU]/mL — ABNORMAL LOW (ref 0.450–4.500)

## 2022-03-25 ENCOUNTER — Other Ambulatory Visit: Payer: Self-pay

## 2022-03-25 DIAGNOSIS — E039 Hypothyroidism, unspecified: Secondary | ICD-10-CM

## 2022-03-26 ENCOUNTER — Other Ambulatory Visit: Payer: Self-pay | Admitting: Internal Medicine

## 2022-03-26 ENCOUNTER — Other Ambulatory Visit: Payer: Self-pay

## 2022-03-26 ENCOUNTER — Telehealth: Payer: Self-pay

## 2022-03-26 NOTE — Telephone Encounter (Signed)
Call to pt reference PREP class Explained program/times/locations.  Could potentially do a T/TH afternoon class.  Next one starting in January.  Will call her back in January to check in to see if that still works for her.

## 2022-03-27 MED ORDER — ROSUVASTATIN CALCIUM 20 MG PO TABS
ORAL_TABLET | ORAL | 1 refills | Status: DC
  Filled 2022-03-27: qty 75, 90d supply, fill #0
  Filled 2022-06-26: qty 75, 90d supply, fill #1

## 2022-03-27 MED ORDER — LEVOCETIRIZINE DIHYDROCHLORIDE 5 MG PO TABS
5.0000 mg | ORAL_TABLET | Freq: Every evening | ORAL | 1 refills | Status: DC
  Filled 2022-03-27: qty 90, fill #0
  Filled 2022-06-26: qty 90, 90d supply, fill #0
  Filled 2022-09-20: qty 90, 90d supply, fill #1

## 2022-03-28 ENCOUNTER — Other Ambulatory Visit: Payer: Self-pay

## 2022-03-29 ENCOUNTER — Other Ambulatory Visit: Payer: Self-pay

## 2022-03-30 ENCOUNTER — Other Ambulatory Visit: Payer: Self-pay

## 2022-04-16 ENCOUNTER — Other Ambulatory Visit: Payer: Self-pay | Admitting: Internal Medicine

## 2022-04-18 ENCOUNTER — Other Ambulatory Visit: Payer: Self-pay

## 2022-04-18 ENCOUNTER — Other Ambulatory Visit: Payer: Self-pay | Admitting: Internal Medicine

## 2022-04-19 ENCOUNTER — Other Ambulatory Visit: Payer: Self-pay

## 2022-04-19 MED FILL — Levothyroxine Sodium Tab 88 MCG: ORAL | 60 days supply | Qty: 52 | Fill #0 | Status: AC

## 2022-04-23 ENCOUNTER — Other Ambulatory Visit: Payer: Self-pay

## 2022-04-27 ENCOUNTER — Other Ambulatory Visit: Payer: PPO

## 2022-05-13 ENCOUNTER — Other Ambulatory Visit: Payer: PPO

## 2022-05-13 DIAGNOSIS — E039 Hypothyroidism, unspecified: Secondary | ICD-10-CM

## 2022-05-14 LAB — TSH: TSH: 0.318 u[IU]/mL — ABNORMAL LOW (ref 0.450–4.500)

## 2022-05-21 ENCOUNTER — Telehealth: Payer: Self-pay

## 2022-05-21 NOTE — Telephone Encounter (Signed)
Call to pt reference next PREP class starting on 06/15/22 at [facility] on T/TH 1p-215p.  Will need to check schedule and call me back before accepting.

## 2022-05-25 ENCOUNTER — Inpatient Hospital Stay: Admission: RE | Admit: 2022-05-25 | Payer: PPO | Source: Ambulatory Visit

## 2022-06-10 ENCOUNTER — Other Ambulatory Visit: Payer: Self-pay

## 2022-06-10 MED ORDER — DOXYCYCLINE HYCLATE 100 MG PO CAPS
100.0000 mg | ORAL_CAPSULE | Freq: Every day | ORAL | 1 refills | Status: DC
  Filled 2022-06-10: qty 10, 10d supply, fill #0
  Filled 2022-06-26: qty 10, 10d supply, fill #1

## 2022-06-22 ENCOUNTER — Other Ambulatory Visit: Payer: Self-pay | Admitting: Obstetrics and Gynecology

## 2022-06-22 DIAGNOSIS — Z1231 Encounter for screening mammogram for malignant neoplasm of breast: Secondary | ICD-10-CM

## 2022-06-23 ENCOUNTER — Other Ambulatory Visit: Payer: PPO

## 2022-06-26 ENCOUNTER — Other Ambulatory Visit: Payer: Self-pay

## 2022-06-26 MED FILL — Levothyroxine Sodium Tab 88 MCG: ORAL | 60 days supply | Qty: 52 | Fill #1 | Status: AC

## 2022-06-28 ENCOUNTER — Other Ambulatory Visit: Payer: Self-pay

## 2022-07-05 ENCOUNTER — Ambulatory Visit
Admission: RE | Admit: 2022-07-05 | Discharge: 2022-07-05 | Disposition: A | Discharge status: Home / Self Care | Payer: PPO | Source: Ambulatory Visit | Attending: Internal Medicine | Admitting: Internal Medicine

## 2022-07-05 DIAGNOSIS — Z87891 Personal history of nicotine dependence: Secondary | ICD-10-CM

## 2022-07-20 ENCOUNTER — Other Ambulatory Visit: Payer: Self-pay

## 2022-07-23 ENCOUNTER — Other Ambulatory Visit: Payer: Self-pay

## 2022-07-23 MED ORDER — CLINDAMYCIN HCL 150 MG PO CAPS
150.0000 mg | ORAL_CAPSULE | Freq: Three times a day (TID) | ORAL | 0 refills | Status: DC
  Filled 2022-07-23: qty 15, 5d supply, fill #0

## 2022-07-23 MED ORDER — HYDROCODONE-ACETAMINOPHEN 5-325 MG PO TABS
1.0000 | ORAL_TABLET | Freq: Four times a day (QID) | ORAL | 0 refills | Status: DC | PRN
  Filled 2022-07-23: qty 10, 3d supply, fill #0

## 2022-07-24 ENCOUNTER — Telehealth: Payer: Self-pay

## 2022-07-24 NOTE — Telephone Encounter (Signed)
Call to patient reference starting PREP on 08/03/22. Would like to start April 30th instead.  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 Will contact her closer to start of class so we can scheduled 1:1 appt.

## 2022-08-25 ENCOUNTER — Other Ambulatory Visit: Payer: Self-pay

## 2022-08-25 ENCOUNTER — Other Ambulatory Visit: Payer: Self-pay | Admitting: Internal Medicine

## 2022-08-26 ENCOUNTER — Other Ambulatory Visit: Payer: Self-pay

## 2022-08-26 MED FILL — Levothyroxine Sodium Tab 88 MCG: ORAL | 60 days supply | Qty: 52 | Fill #2 | Status: CN

## 2022-08-26 MED FILL — Levothyroxine Sodium Tab 88 MCG: ORAL | 60 days supply | Qty: 52 | Fill #2 | Status: AC

## 2022-09-03 ENCOUNTER — Telehealth: Payer: Self-pay

## 2022-09-03 NOTE — Telephone Encounter (Signed)
Late entry: Spoke with pt early in week reference next PREP class starting on 09/14/22. Can do-intake scheduled at [facility] on 09/09/22 at 2pm. Will meet pt at front desk.

## 2022-09-06 ENCOUNTER — Ambulatory Visit
Admission: RE | Admit: 2022-09-06 | Discharge: 2022-09-06 | Disposition: A | Discharge status: Home / Self Care | Payer: PPO | Source: Ambulatory Visit | Attending: Obstetrics and Gynecology | Admitting: Obstetrics and Gynecology

## 2022-09-06 DIAGNOSIS — Z1231 Encounter for screening mammogram for malignant neoplasm of breast: Secondary | ICD-10-CM

## 2022-09-09 NOTE — Progress Notes (Signed)
[facility] PREP Evaluation  Patient Details  Name: Anita Duncan MRN: [id number] Date of Birth: 1955-09-06 Age: 67 y.o. PCP: Dorothyann Peng, MD  Vitals:   09/09/22 1400  BP: 132/82  Pulse: 95  SpO2: 97%  Weight: 187 lb 6.4 oz (85 kg)     [facility] Eval - 09/09/22 1700       [facility] "PREP" Location   [facility]PREP" Location [facility]      Referral    Referring Provider Sanders    Reason for referral High Cholesterol    Program Start Date 09/14/22   T/TH 1p-215p x 12wks     Measurement   Waist Circumference 41 inches    Hip Circumference 43 inches    Body fat 43.8 percent      Information for Trainer   Goals Tone up, lose inches off waist, lose weight    Current Exercise walks 2x wk    Orthopedic Concerns Shoulder impingement on the right    Pertinent Medical History CAD, hypothyroid, inc chol    Current Barriers none      Timed Up and Go (TUGS)   Timed Up and Go Low risk <9 seconds      Mobility and Daily Activities   I find it easy to walk up or down two or more flights of stairs. 4    I have no trouble taking out the trash. 4    I do housework such as vacuuming and dusting on my own without difficulty. 4    I can easily lift a gallon of milk (8lbs). 4    I can easily walk a mile. 4    I have no trouble reaching into high cupboards or reaching down to pick up something from the floor. 2    I do not have trouble doing out-door work such as Loss adjuster, chartered, raking leaves, or gardening. 1      Mobility and Daily Activities   I feel younger than my age. 2    I feel independent. 4    I feel energetic. 2    I live an active life.  4    I feel strong. 4    I feel healthy. 4    I feel active as other people my age. 4      How fit and strong are you.   Fit and Strong Total Score 47            Past Medical History:  Diagnosis Date   Allergy    Hypothyroidism    Seasonal allergies    Past Surgical History:  Procedure Laterality Date   BREAST BIOPSY Right     BREAST BIOPSY Right    BREAST EXCISIONAL BIOPSY Right    CESAREAN SECTION     TONSILLECTOMY     Social History   Tobacco Use  Smoking Status Former   Packs/day: 0.50   Years: 44.00   Additional pack years: 0.00   Total pack years: 22.00   Types: Cigarettes   Start date: 17   Quit date: 09/02/2020   Years since quitting: 2.0   Passive exposure: Never  Smokeless Tobacco Former  Tobacco Comments   Encouraged to cut back on number of cigs smoked    Bonnye Fava 09/09/2022, 5:23 PM

## 2022-09-13 ENCOUNTER — Telehealth: Payer: Self-pay

## 2022-09-13 NOTE — Telephone Encounter (Signed)
Call to pt reference change in start of PREP from 09/14/22 to 09/21/22 still T/TH 1p-215p due to Brewing technologist at [facility]

## 2022-09-20 ENCOUNTER — Other Ambulatory Visit: Payer: Self-pay | Admitting: Internal Medicine

## 2022-09-21 ENCOUNTER — Other Ambulatory Visit: Payer: Self-pay

## 2022-09-21 MED ORDER — ROSUVASTATIN CALCIUM 20 MG PO TABS
ORAL_TABLET | ORAL | 1 refills | Status: DC
  Filled 2022-09-21: qty 75, 75d supply, fill #0
  Filled 2022-12-10 – 2023-02-07 (×3): qty 75, 75d supply, fill #1

## 2022-09-22 DIAGNOSIS — H40053 Ocular hypertension, bilateral: Secondary | ICD-10-CM | POA: Diagnosis not present

## 2022-09-22 DIAGNOSIS — H2511 Age-related nuclear cataract, right eye: Secondary | ICD-10-CM | POA: Diagnosis not present

## 2022-09-22 DIAGNOSIS — H2512 Age-related nuclear cataract, left eye: Secondary | ICD-10-CM | POA: Diagnosis not present

## 2022-09-23 NOTE — Progress Notes (Signed)
[facility] PRE[facility] Weekly Session  Patient Details  Name: Anita Duncan MRN: [id number] Date of Birth: Jun 05, 1955 Age: 67 y.o. PCP: Dorothyann Peng, MD  There were no vitals filed for this visit.   [facility] Weekly seesion - 09/23/22 1500       [facility] "PREP" Location   [facility]PREP[facility] Location [facility]      Weekly Session   Topic Discussed Goal setting and welcome to the program    Classes attended to date 2             Bonnye Fava 09/23/2022, 3:46 PM

## 2022-09-30 NOTE — Progress Notes (Signed)
[facility] PREP Weekly Session  Patient Details  Name: Anita Duncan MRN: [id number] Date of Birth: 07-06-1955 Age: 67 y.o. PCP: Dorothyann Peng, MD  Vitals:   09/28/22 1430  Weight: 186 lb (84.4 kg)     [facility] Weekly seesion - 09/30/22 1600       [facility] "PREP" Location   [facility]PREP" Location [facility]      Weekly Session   Topic Discussed Importance of resistance training;Other ways to be active    Minutes exercised this week 160 minutes    Classes attended to date 3             Bonnye Fava 09/30/2022, 4:02 PM

## 2022-10-04 ENCOUNTER — Ambulatory Visit (INDEPENDENT_AMBULATORY_CARE_PROVIDER_SITE_OTHER): Payer: PPO | Admitting: Internal Medicine

## 2022-10-04 ENCOUNTER — Other Ambulatory Visit: Payer: Self-pay | Admitting: Internal Medicine

## 2022-10-04 ENCOUNTER — Encounter: Payer: Self-pay | Admitting: Internal Medicine

## 2022-10-04 VITALS — BP 116/80 | HR 80 | Temp 98.1°F | Ht 62.2 in | Wt 182.6 lb

## 2022-10-04 DIAGNOSIS — Z87891 Personal history of nicotine dependence: Secondary | ICD-10-CM

## 2022-10-04 DIAGNOSIS — I2583 Coronary atherosclerosis due to lipid rich plaque: Secondary | ICD-10-CM

## 2022-10-04 DIAGNOSIS — Z23 Encounter for immunization: Secondary | ICD-10-CM | POA: Diagnosis not present

## 2022-10-04 DIAGNOSIS — Z0000 Encounter for general adult medical examination without abnormal findings: Secondary | ICD-10-CM

## 2022-10-04 DIAGNOSIS — E6609 Other obesity due to excess calories: Secondary | ICD-10-CM | POA: Diagnosis not present

## 2022-10-04 DIAGNOSIS — R7309 Other abnormal glucose: Secondary | ICD-10-CM | POA: Diagnosis not present

## 2022-10-04 DIAGNOSIS — I700 Atherosclerosis of aorta: Secondary | ICD-10-CM | POA: Diagnosis not present

## 2022-10-04 DIAGNOSIS — Z0001 Encounter for general adult medical examination with abnormal findings: Secondary | ICD-10-CM | POA: Diagnosis not present

## 2022-10-04 DIAGNOSIS — E039 Hypothyroidism, unspecified: Secondary | ICD-10-CM | POA: Diagnosis not present

## 2022-10-04 DIAGNOSIS — I2510 Atherosclerotic heart disease of native coronary artery without angina pectoris: Secondary | ICD-10-CM | POA: Diagnosis not present

## 2022-10-04 DIAGNOSIS — Z6833 Body mass index (BMI) 33.0-33.9, adult: Secondary | ICD-10-CM

## 2022-10-04 NOTE — Progress Notes (Signed)
Jeri Cos [place],acting as a Neurosurgeon for [name], MD.,have documented all relevant documentation on the behalf of [name], MD,as directed by  [name], MD while in the presence of [name], MD.   Subjective:     Patient ID: Anita Duncan , female    DOB: 1955/06/10 , 67 y.o.   MRN: [id number]   Chief Complaint  Patient presents with   Annual Exam    HPI  She is here today for a full physical examination. She is followed by Dr. Billy Coast for her GYN exams. She reports compliance with meds. Denies having headaches, chest pain and shortness of breath. She has no specific concerns or complaints at this time.   Thyroid Problem Presents for follow-up visit. Patient reports no cold intolerance, constipation, depressed mood, menstrual problem, palpitations or tremors.     Past Medical History:  Diagnosis Date   Allergy    Hypothyroidism    Seasonal allergies      Family History  Problem Relation Age of Onset   Allergic rhinitis Mother    Breast cancer Cousin 27   Asthma Son      Current Outpatient Medications:    fluticasone (FLONASE) 50 MCG/ACT nasal spray, SPRAY 1 SPRAY INTO BOTH NOSTRILS DAILY., Disp: 48 mL, Rfl: 1   levocetirizine (XYZAL) 5 MG tablet, Take 1 tablet (5 mg total) by mouth every evening., Disp: 90 tablet, Rfl: 1   rosuvastatin (CRESTOR) 20 MG tablet, Take 1 tablet by mouth daily on Monday-Saturday, skip Sundays, Disp: 75 tablet, Rfl: 1   EPINEPHrine 0.3 mg/0.3 mL IJ SOAJ injection, Inject 0.3 mg into the muscle as needed for anaphylaxis. (Patient not taking: Reported on 10/04/2022), Disp: 1 each, Rfl: 1   nabumetone (RELAFEN) 750 MG tablet, TAKE 1 TABLET BY MOUTH TWICE  DAILY WITH FOOD AS NEEDED FOR  MILD OR MODERATE PAIN (Patient not taking: Reported on 10/04/2022), Disp: 180 tablet, Rfl: 3   RYALTRIS 665-25 MCG/ACT SUSP, Place 2 puffs into the nose daily as needed. (Patient not taking: Reported on 10/04/2022), Disp: 29 g, Rfl: 5    Vitamin D, Ergocalciferol, (DRISDOL) 1.25 MG (50000 UNIT) CAPS capsule, TAKE 1 CAPSULE BY MOUTH ON TUESDAYS AND FRIDAYS (Patient not taking: Reported on 10/04/2022), Disp: 24 capsule, Rfl: 0   Allergies  Allergen Reactions   Amoxicillin Itching   Ceftin [Cefuroxime] Itching   Codeine Nausea Only   Sulfa Antibiotics Nausea And Vomiting      The patient states she uses post menopausal status for birth control. Last LMP was No LMP recorded. Patient is postmenopausal.. Negative for Dysmenorrhea. Negative for: breast discharge, breast lump(s), breast pain and breast self exam. Associated symptoms include abnormal vaginal bleeding. Pertinent negatives include abnormal bleeding (hematology), anxiety, decreased libido, depression, difficulty falling sleep, dyspareunia, history of infertility, nocturia, sexual dysfunction, sleep disturbances, urinary incontinence, urinary urgency, vaginal discharge and vaginal itching. Diet regular.The patient states her exercise level is moderate. She is now participating in the PREP program.    . The patient's tobacco use is:  Social History   Tobacco Use  Smoking Status Former   Packs/day: 0.50   Years: 44.00   Additional pack years: 0.00   Total pack years: 22.00   Types: Cigarettes   Start date: 3   Quit date: 09/02/2020   Years since quitting: 2.0   Passive exposure: Never  Smokeless Tobacco Former  Tobacco Comments   Encouraged to cut back on number of cigs smoked  .  She has been exposed to passive smoke. The patient's alcohol use is:  Social History   Substance and Sexual Activity  Alcohol Use Yes   Alcohol/week: 1.0 standard drink of alcohol   Types: 1 Glasses of wine per week   Comment: 2 x a year   Review of Systems  Constitutional: Negative.   HENT: Negative.    Eyes: Negative.   Respiratory: Negative.    Cardiovascular: Negative.  Negative for palpitations.  Gastrointestinal: Negative.  Negative for constipation.  Endocrine:  Negative.  Negative for cold intolerance.  Genitourinary: Negative.  Negative for menstrual problem.  Musculoskeletal: Negative.   Skin: Negative.   Allergic/Immunologic: Negative.   Neurological: Negative.  Negative for tremors.  Hematological: Negative.   Psychiatric/Behavioral: Negative.       Today's Vitals   10/04/22 1410  BP: 116/80  Pulse: 80  Temp: 98.1 F (36.7 C)  Weight: 182 lb 9.6 oz (82.8 kg)  Height: 5' 2.2" (1.58 m)  PainSc: 0-No pain   Body mass index is 33.18 kg/m.  Wt Readings from Last 3 Encounters:  10/04/22 182 lb 9.6 oz (82.8 kg)  09/28/22 186 lb (84.4 kg)  09/09/22 187 lb 6.4 oz (85 kg)     Objective:  Physical Exam Vitals and nursing note reviewed.  Constitutional:      Appearance: Normal appearance.  HENT:     Head: Normocephalic and atraumatic.     Right Ear: Tympanic membrane, ear canal and external ear normal.     Left Ear: Tympanic membrane, ear canal and external ear normal.     Nose: Nose normal.     Mouth/Throat:     Mouth: Mucous membranes are moist.     Pharynx: Oropharynx is clear.  Eyes:     Extraocular Movements: Extraocular movements intact.     Conjunctiva/sclera: Conjunctivae normal.     Pupils: Pupils are equal, round, and reactive to light.  Cardiovascular:     Rate and Rhythm: Normal rate and regular rhythm.     Pulses: Normal pulses.     Heart sounds: Normal heart sounds.  Pulmonary:     Effort: Pulmonary effort is normal.     Breath sounds: Normal breath sounds.  Chest:  Breasts:    Tanner Score is 5.     Comments: She kept her bra on , breast exam not performed Abdominal:     General: Bowel sounds are normal.     Palpations: Abdomen is soft.  Genitourinary:    Comments: deferred Musculoskeletal:        General: Normal range of motion.     Cervical back: Normal range of motion and neck supple.  Skin:    General: Skin is warm and dry.  Neurological:     General: No focal deficit present.     Mental Status:  She is alert and oriented to person, place, and time.  Psychiatric:        Mood and Affect: Mood normal.        Behavior: Behavior normal.     Assessment And Plan:     1. Encounter for annual physical exam Comments: A full exam was performed. Importance of monthly self breast exams was discussed with the patient.  PATIENT IS ADVISED TO GET 30-45 MINUTES REGULAR EXERCISE NO LESS THAN FOUR TO FIVE DAYS PER WEEK - BOTH WEIGHTBEARING EXERCISES AND AEROBIC ARE RECOMMENDED.  PATIENT IS ADVISED TO FOLLOW A HEALTHY DIET WITH AT LEAST SIX FRUITS/VEGGIES PER DAY, DECREASE INTAKE OF RED  MEAT, AND TO INCREASE FISH INTAKE TO TWO DAYS PER WEEK.  MEATS/FISH SHOULD NOT BE FRIED, BAKED OR BROILED IS PREFERABLE.  IT IS ALSO IMPORTANT TO CUT BACK ON YOUR SUGAR INTAKE. PLEASE AVOID ANYTHING WITH ADDED SUGAR, CORN SYRUP OR OTHER SWEETENERS. IF YOU MUST USE A SWEETENER, YOU CAN TRY STEVIA. IT IS ALSO IMPORTANT TO AVOID ARTIFICIALLY SWEETENERS AND DIET BEVERAGES. LASTLY, I SUGGEST WEARING SPF 50 SUNSCREEN ON EXPOSED PARTS AND ESPECIALLY WHEN IN THE DIRECT SUNLIGHT FOR AN EXTENDED PERIOD OF TIME.  PLEASE AVOID FAST FOOD RESTAURANTS AND INCREASE YOUR WATER INTAKE.  2. Primary hypothyroidism Comments: Chronic, I will check thyroid panel and adjust meds as needed. She will rto in six months for re-evaluation. - TSH + free T4  3. Other abnormal glucose Comments: Previous labs reviewed, her a1c has been elevated in the past. I will recheck an a1c today, encouaged to limit her intake of sugary beverages/foods. - CMP14+EGFR - Hemoglobin A1c  4. Coronary artery disease due to lipid rich plaque Comments: Chronic, she will c/w rosuvastatin daily. Most recent Cardiology note reviewed. Consider ASA therapy, as per Cardiology.  5. Atherosclerosis of aorta (HCC) Comments: Chronic, LDL goal < 70. Importance of statin compliance was d/w patient.  She will c/w rosuvastatin 20mg  daily. Consider adding ASA 81ng - CBC with  Differential/Platelet - Lipid panel - CMP14+EGFR  6. Class 1 obesity due to excess calories with serious comorbidity and body mass index (BMI) of 33.0 to 33.9 in adult Comments: She is encouraged to aim for at least 150 minutes of exercise/week, while striving for BMI<30 to decrease cardiac risk.  7. Immunization due - Varicella-zoster vaccine IM  8. History of tobacco use Comments: Per Radiology tech, she does not qualify for LDCT, despite her documented tobacco history. I will check CT chest w/o CM. - CT Chest Wo Contrast; Future  Return in 6 months (on 04/06/2023), or thyroid check, for 1 year physical. Patient was given opportunity to ask questions. Patient verbalized understanding of the plan and was able to repeat key elements of the plan. All questions were answered to their satisfaction.   I, [name], MD, have reviewed all documentation for this visit. The documentation on 10/04/22 for the exam, diagnosis, procedures, and orders are all accurate and complete.   THE PATIENT IS ENCOURAGED TO PRACTICE SOCIAL DISTANCING DUE TO THE COVID-19 PANDEMIC.

## 2022-10-04 NOTE — Patient Instructions (Signed)

## 2022-10-05 LAB — CMP14+EGFR
ALT: 19 IU/L (ref 0–32)
AST: 28 IU/L (ref 0–40)
Albumin/Globulin Ratio: 1.8 (ref 1.2–2.2)
Albumin: 4.5 g/dL (ref 3.9–4.9)
Alkaline Phosphatase: 99 IU/L (ref 44–121)
BUN/Creatinine Ratio: 13 (ref 12–28)
BUN: 12 mg/dL (ref 8–27)
Bilirubin Total: 0.5 mg/dL (ref 0.0–1.2)
CO2: 24 mmol/L (ref 20–29)
Calcium: 9.4 mg/dL (ref 8.7–10.3)
Chloride: 103 mmol/L (ref 96–106)
Creatinine, Ser: 0.9 mg/dL (ref 0.57–1.00)
Globulin, Total: 2.5 g/dL (ref 1.5–4.5)
Glucose: 86 mg/dL (ref 70–99)
Potassium: 4.5 mmol/L (ref 3.5–5.2)
Sodium: 139 mmol/L (ref 134–144)
Total Protein: 7 g/dL (ref 6.0–8.5)
eGFR: 71 mL/min/{1.73_m2} (ref >59)

## 2022-10-05 LAB — CBC WITH DIFFERENTIAL/PLATELET
Basophils Absolute: 0 10*3/uL (ref 0.0–0.2)
Basos: 0 %
EOS (ABSOLUTE): 0.2 10*3/uL (ref 0.0–0.4)
Eos: 3 %
Hematocrit: 37.6 % (ref 34.0–46.6)
Hemoglobin: 12 g/dL (ref 11.1–15.9)
Immature Grans (Abs): 0 10*3/uL (ref 0.0–0.1)
Immature Granulocytes: 0 %
Lymphocytes Absolute: 2 10*3/uL (ref 0.7–3.1)
Lymphs: 36 %
MCH: 28.7 pg (ref 26.6–33.0)
MCHC: 31.9 g/dL (ref 31.5–35.7)
MCV: 90 fL (ref 79–97)
Monocytes Absolute: 0.3 10*3/uL (ref 0.1–0.9)
Monocytes: 6 %
Neutrophils Absolute: 3.1 10*3/uL (ref 1.4–7.0)
Neutrophils: 55 %
Platelets: 236 10*3/uL (ref 150–450)
RBC: 4.18 x10E6/uL (ref 3.77–5.28)
RDW: 13.7 % (ref 11.7–15.4)
WBC: 5.7 10*3/uL (ref 3.4–10.8)

## 2022-10-05 LAB — LIPID PANEL
Chol/HDL Ratio: 2 ratio (ref 0.0–4.4)
Cholesterol, Total: 159 mg/dL (ref 100–199)
HDL: 78 mg/dL (ref >39)
LDL Chol Calc (NIH): 69 mg/dL (ref 0–99)
Triglycerides: 57 mg/dL (ref 0–149)
VLDL Cholesterol Cal: 12 mg/dL (ref 5–40)

## 2022-10-05 LAB — TSH+FREE T4
Free T4: 1.49 ng/dL (ref 0.82–1.77)
TSH: 0.551 u[IU]/mL (ref 0.450–4.500)

## 2022-10-05 LAB — HEMOGLOBIN A1C
Est. average glucose Bld gHb Est-mCnc: 128 mg/dL
Hgb A1c MFr Bld: 6.1 % — ABNORMAL HIGH (ref 4.8–5.6)

## 2022-10-07 NOTE — Progress Notes (Signed)
[facility] PREP Weekly Session  Patient Details  Name: Anita Duncan MRN: [id number] Date of Birth: Mar 17, 1956 Age: 67 y.o. PCP: Dorothyann Peng, MD  Vitals:   10/05/22 1300  Weight: 184 lb 6.4 oz (83.6 kg)     [facility] Weekly seesion - 10/07/22 1200       [facility] "PREP" Location   [facility]      Weekly Session   Topic Discussed Healthy eating tips    Minutes exercised this week 40 minutes   3 1/2 mile walking, 500 steps, stretching before/after bed   Classes attended to date 5             Bonnye Fava 10/07/2022, 12:39 PM

## 2022-10-10 ENCOUNTER — Encounter: Payer: Self-pay | Admitting: Internal Medicine

## 2022-10-12 ENCOUNTER — Encounter: Payer: Self-pay | Admitting: Internal Medicine

## 2022-10-12 NOTE — Progress Notes (Signed)
[facility] PREP Weekly Session  Patient Details  Name: Anita Duncan MRN: [id number] Date of Birth: 10-26-1955 Age: 67 y.o. PCP: Dorothyann Peng, MD  Vitals:   10/12/22 1300  Weight: 183 lb 3.2 oz (83.1 kg)     [facility] Weekly seesion - 10/12/22 1700       [facility] "PREP" Location   [facility]      Weekly Session   Topic Discussed Health habits    Minutes exercised this week 160 minutes    Classes attended to date 7             Bonnye Fava 10/12/2022, 5:17 PM

## 2022-10-20 NOTE — Progress Notes (Signed)
[facility] PREP Weekly Session  Patient Details  Name: Anita Duncan MRN: [id number] Date of Birth: 1956/04/21 Age: 67 y.o. PCP: Dorothyann Peng, MD  Vitals:   10/19/22 1300  Weight: 184 lb 12.8 oz (83.8 kg)     [facility] Weekly seesion - 10/20/22 0900       [facility] "PREP" Location   [facility] "PREP" Location [facility]      Weekly Session   Topic Discussed Stress management and problem solving    Minutes exercised this week 100 minutes   2 miles   Classes attended to date 59             Pam Jerral Bonito 10/20/2022, 9:13 AM

## 2022-10-21 ENCOUNTER — Ambulatory Visit (INDEPENDENT_AMBULATORY_CARE_PROVIDER_SITE_OTHER): Payer: PPO

## 2022-10-21 VITALS — BP 118/80 | HR 84 | Temp 97.8°F | Ht 62.4 in | Wt 182.8 lb

## 2022-10-21 DIAGNOSIS — Z0000 Encounter for general adult medical examination without abnormal findings: Secondary | ICD-10-CM | POA: Diagnosis not present

## 2022-10-21 NOTE — Patient Instructions (Addendum)
Ms. Anita Duncan , Thank you for taking time to come for your Medicare Wellness Visit. I appreciate your ongoing commitment to your health goals. Please review the following plan we discussed and let me know if I can assist you in the future.   These are the goals we discussed:  Goals      Increase physical activity     Aim for at least 150 minutes of exercise per week     Patient Stated     10/21/2022, wants to build muscle and lose 20 pounds        This is a list of the screening recommended for you and due dates:  Health Maintenance  Topic Date Due   Screening for Lung Cancer  09/16/2020   COVID-19 Vaccine (5 - 2023-24 season) 01/15/2022   Zoster (Shingles) Vaccine (2 of 2) 11/29/2022   Flu Shot  12/16/2022   DTaP/Tdap/Td vaccine (2 - Td or Tdap) 09/11/2023   Medicare Annual Wellness Visit  10/21/2023   Mammogram  09/05/2024   Colon Cancer Screening  08/20/2025   Pneumonia Vaccine  Completed   DEXA scan (bone density measurement)  Completed   Hepatitis C Screening  Completed   HPV Vaccine  Aged Out    Advanced directives: Advance directive discussed with you today.   Conditions/risks identified: none  Next appointment: Follow up in one year for your annual wellness visit    Preventive Care 65 Years and Older, Female Preventive care refers to lifestyle choices and visits with your health care provider that can promote health and wellness. What does preventive care include? A yearly physical exam. This is also called an annual well check. Dental exams once or twice a year. Routine eye exams. Ask your health care provider how often you should have your eyes checked. Personal lifestyle choices, including: Daily care of your teeth and gums. Regular physical activity. Eating a healthy diet. Avoiding tobacco and drug use. Limiting alcohol use. Practicing safe sex. Taking low-dose aspirin every day. Taking vitamin and mineral supplements as recommended by your health care  provider. What happens during an annual well check? The services and screenings done by your health care provider during your annual well check will depend on your age, overall health, lifestyle risk factors, and family history of disease. Counseling  Your health care provider may ask you questions about your: Alcohol use. Tobacco use. Drug use. Emotional well-being. Home and relationship well-being. Sexual activity. Eating habits. History of falls. Memory and ability to understand (cognition). Work and work Astronomer. Reproductive health. Screening  You may have the following tests or measurements: Height, weight, and BMI. Blood pressure. Lipid and cholesterol levels. These may be checked every 5 years, or more frequently if you are over 67 years old. Skin check. Lung cancer screening. You may have this screening every year starting at age 67 if you have a 30-pack-year history of smoking and currently smoke or have quit within the past 15 years. Fecal occult blood test (FOBT) of the stool. You may have this test every year starting at age 67. Flexible sigmoidoscopy or colonoscopy. You may have a sigmoidoscopy every 5 years or a colonoscopy every 10 years starting at age 26. Hepatitis C blood test. Hepatitis B blood test. Sexually transmitted disease (STD) testing. Diabetes screening. This is done by checking your blood sugar (glucose) after you have not eaten for a while (fasting). You may have this done every 1-3 years. Bone density scan. This is done to screen for  osteoporosis. You may have this done starting at age 67. Mammogram. This may be done every 1-2 years. Talk to your health care provider about how often you should have regular mammograms. Talk with your health care provider about your test results, treatment options, and if necessary, the need for more tests. Vaccines  Your health care provider may recommend certain vaccines, such as: Influenza vaccine. This is  recommended every year. Tetanus, diphtheria, and acellular pertussis (Tdap, Td) vaccine. You may need a Td booster every 10 years. Zoster vaccine. You may need this after age 34. Pneumococcal 13-valent conjugate (PCV13) vaccine. One dose is recommended after age 36. Pneumococcal polysaccharide (PPSV23) vaccine. One dose is recommended after age 32. Talk to your health care provider about which screenings and vaccines you need and how often you need them. This information is not intended to replace advice given to you by your health care provider. Make sure you discuss any questions you have with your health care provider. Document Released: 05/30/2015 Document Revised: 01/21/2016 Document Reviewed: 03/04/2015 Elsevier Interactive Patient Education  2017 [employer].  Fall Prevention in the Home Falls can cause injuries. They can happen to people of all ages. There are many things you can do to make your home safe and to help prevent falls. What can I do on the outside of my home? Regularly fix the edges of walkways and driveways and fix any cracks. Remove anything that might make you trip as you walk through a door, such as a raised step or threshold. Trim any bushes or trees on the path to your home. Use bright outdoor lighting. Clear any walking paths of anything that might make someone trip, such as rocks or tools. Regularly check to see if handrails are loose or broken. Make sure that both sides of any steps have handrails. Any raised decks and porches should have guardrails on the edges. Have any leaves, snow, or ice cleared regularly. Use sand or salt on walking paths during winter. Clean up any spills in your garage right away. This includes oil or grease spills. What can I do in the bathroom? Use night lights. Install grab bars by the toilet and in the tub and shower. Do not use towel bars as grab bars. Use non-skid mats or decals in the tub or shower. If you need to sit down in  the shower, use a plastic, non-slip stool. Keep the floor dry. Clean up any water that spills on the floor as soon as it happens. Remove soap buildup in the tub or shower regularly. Attach bath mats securely with double-sided non-slip rug tape. Do not have throw rugs and other things on the floor that can make you trip. What can I do in the bedroom? Use night lights. Make sure that you have a light by your bed that is easy to reach. Do not use any sheets or blankets that are too big for your bed. They should not hang down onto the floor. Have a firm chair that has side arms. You can use this for support while you get dressed. Do not have throw rugs and other things on the floor that can make you trip. What can I do in the kitchen? Clean up any spills right away. Avoid walking on wet floors. Keep items that you use a lot in easy-to-reach places. If you need to reach something above you, use a strong step stool that has a grab bar. Keep electrical cords out of the way. Do not  use floor polish or wax that makes floors slippery. If you must use wax, use non-skid floor wax. Do not have throw rugs and other things on the floor that can make you trip. What can I do with my stairs? Do not leave any items on the stairs. Make sure that there are handrails on both sides of the stairs and use them. Fix handrails that are broken or loose. Make sure that handrails are as long as the stairways. Check any carpeting to make sure that it is firmly attached to the stairs. Fix any carpet that is loose or worn. Avoid having throw rugs at the top or bottom of the stairs. If you do have throw rugs, attach them to the floor with carpet tape. Make sure that you have a light switch at the top of the stairs and the bottom of the stairs. If you do not have them, ask someone to add them for you. What else can I do to help prevent falls? Wear shoes that: Do not have high heels. Have rubber bottoms. Are comfortable  and fit you well. Are closed at the toe. Do not wear sandals. If you use a stepladder: Make sure that it is fully opened. Do not climb a closed stepladder. Make sure that both sides of the stepladder are locked into place. Ask someone to hold it for you, if possible. Clearly mark and make sure that you can see: Any grab bars or handrails. First and last steps. Where the edge of each step is. Use tools that help you move around (mobility aids) if they are needed. These include: Canes. Walkers. Scooters. Crutches. Turn on the lights when you go into a dark area. Replace any light bulbs as soon as they burn out. Set up your furniture so you have a clear path. Avoid moving your furniture around. If any of your floors are uneven, fix them. If there are any pets around you, be aware of where they are. Review your medicines with your doctor. Some medicines can make you feel dizzy. This can increase your chance of falling. Ask your doctor what other things that you can do to help prevent falls. This information is not intended to replace advice given to you by your health care provider. Make sure you discuss any questions you have with your health care provider. Document Released: 02/27/2009 Document Revised: 10/09/2015 Document Reviewed: 06/07/2014 Elsevier Interactive Patient Education  2017 [employer].

## 2022-10-21 NOTE — Progress Notes (Addendum)
Subjective:   Anita Duncan is a 67 y.o. female who presents for Medicare Annual (Subsequent) preventive examination.  Patient Medicare AWV questionnaire was completed by the patient on 10/17/2022; I have confirmed that all information answered by patient is correct and no changes since this date.    Review of Systems     Cardiac Risk Factors include: advanced age (>77men, >48 women)     Objective:    Today's Vitals   10/21/22 1358  BP: 118/80  Pulse: 84  Temp: 97.8 F (36.6 C)  TempSrc: Oral  SpO2: 96%  Weight: 182 lb 12.8 oz (82.9 kg)  Height: 5' 2.4" (1.585 m)   Body mass index is 33.01 kg/m.     10/21/2022    2:08 PM 09/22/2021    2:55 PM 10/25/2019   11:02 PM  Advanced Directives  Does Patient Have a Medical Advance Directive? No Yes No  Does patient want to make changes to medical advance directive?  Yes (MAU/Ambulatory/Procedural Areas - Information given)     Current Medications (verified) Outpatient Encounter Medications as of 10/21/2022  Medication Sig   levocetirizine (XYZAL) 5 MG tablet Take 1 tablet (5 mg total) by mouth every evening.   levothyroxine (SYNTHROID) 88 MCG tablet Take 88 mcg by mouth daily before breakfast. Take one tablet by mouth daily on Monday through Saturdays and 1/2 tablet on sundays   rosuvastatin (CRESTOR) 20 MG tablet Take 1 tablet by mouth daily on Monday-Saturday, skip Sundays   EPINEPHrine 0.3 mg/0.3 mL IJ SOAJ injection Inject 0.3 mg into the muscle as needed for anaphylaxis. (Patient not taking: Reported on 10/04/2022)   fluticasone (FLONASE) 50 MCG/ACT nasal spray SPRAY 1 SPRAY INTO BOTH NOSTRILS DAILY. (Patient not taking: Reported on 10/21/2022)   nabumetone (RELAFEN) 750 MG tablet TAKE 1 TABLET BY MOUTH TWICE  DAILY WITH FOOD AS NEEDED FOR  MILD OR MODERATE PAIN (Patient not taking: Reported on 10/04/2022)   RYALTRIS 665-25 MCG/ACT SUSP Place 2 puffs into the nose daily as needed. (Patient not taking: Reported on 10/04/2022)    Vitamin D, Ergocalciferol, (DRISDOL) 1.25 MG (50000 UNIT) CAPS capsule TAKE 1 CAPSULE BY MOUTH ON TUESDAYS AND FRIDAYS (Patient not taking: Reported on 10/04/2022)   No facility-administered encounter medications on file as of 10/21/2022.    Allergies (verified) Amoxicillin, Ceftin [cefuroxime], Codeine, and Sulfa antibiotics   History: Past Medical History:  Diagnosis Date   Allergy    Anemia During pregnancy 1984.  Not present anymore   Cataract Microscopic one.  Eye exams annually to make sure   Heart murmur Was told not severe.  We all have one some worse t   Hypothyroidism    Neuromuscular disorder (HCC) 2009   Injury on the job.  Currently being treated, ortho doctor.,   Seasonal allergies    Past Surgical History:  Procedure Laterality Date   BREAST BIOPSY Right    BREAST BIOPSY Right    BREAST EXCISIONAL BIOPSY Right    CESAREAN SECTION     TONSILLECTOMY     Family History  Problem Relation Age of Onset   Allergic rhinitis Mother    Hearing loss Mother    Heart disease Mother    Hypertension Mother    Heart disease Father    Breast cancer Cousin 35   Asthma Son    Cancer Maternal Grandfather    Miscarriages / Stillbirths Maternal Grandmother    Stroke Maternal Grandmother    Arthritis Maternal Aunt    Stroke  Maternal Aunt    Vision loss Maternal Aunt    Arthritis Paternal Aunt    Asthma Son    Diabetes Maternal Aunt    Heart disease Maternal Aunt    Hypertension Maternal Aunt    Social History   Socioeconomic History   Marital status: Single    Spouse name: Not on file   Number of children: Not on file   Years of education: Not on file   Highest education level: Not on file  Occupational History   Not on file  Tobacco Use   Smoking status: Former    Packs/day: 0.50    Years: 18.00    Additional pack years: 0.00    Total pack years: 9.00    Types: Cigarettes    Start date: 19    Quit date: 09/03/2019    Years since quitting: 3.1    Passive  exposure: Never   Smokeless tobacco: Former   Tobacco comments:    Cutting back.  Prefer to do cold [place].  Smoked off & on.  Some yrs I quit 6 mos - 1 yr.   Smoked  Vaping Use   Vaping Use: Never used  Substance and Sexual Activity   Alcohol use: Not Currently    Alcohol/week: 1.0 - 2.0 standard drink of alcohol    Types: 1 - 2 Glasses of wine per week    Comment: A glass of wine special occasions.  1/2 glasses e/blue moon   Drug use: No   Sexual activity: Not Currently    Birth control/protection: Abstinence, Condom, Post-menopausal, Other-see comments    Comment: .  Choice condoms but currently abstinence  Other Topics Concern   Not on file  Social History Narrative   Not on file   Social Determinants of Health   Financial Resource Strain: Low Risk  (10/17/2022)   Overall Financial Resource Strain (CARDIA)    Difficulty of Paying Living Expenses: Not very hard  Food Insecurity: No Food Insecurity (10/17/2022)   Hunger Vital Sign    Worried About Running Out of Food in the Last Year: Never true    Ran Out of Food in the Last Year: Never true  Transportation Needs: No Transportation Needs (10/17/2022)   PRAPARE - Administrator, Civil Service (Medical): No    Lack of Transportation (Non-Medical): No  Physical Activity: Insufficiently Active (10/17/2022)   Exercise Vital Sign    Days of Exercise per Week: 3 days    Minutes of Exercise per Session: 40 min  Stress: No Stress Concern Present (10/17/2022)   [employer] of Occupational Health - Occupational Stress Questionnaire    Feeling of Stress : Only a little  Social Connections: Unknown (10/17/2022)   Social Connection and Isolation Panel [NHANES]    Frequency of Communication with Friends and Family: Twice a week    Frequency of Social Gatherings with Friends and Family: Twice a week    Attends Religious Services: Not on Marketing executive or Organizations: Yes    Attends Banker  Meetings: 1 to 4 times per year    Marital Status: Never married    Tobacco Counseling Counseling given: Not Answered Tobacco comments: Cutting back.  Prefer to do cold [place].  Smoked off & on.  Some yrs I quit 6 mos - 1 yr.   Smoked   Clinical Intake:  Pre-visit preparation completed: Yes  Pain : No/denies pain     Nutritional Status:  BMI > 30  Obese Nutritional Risks: None Diabetes: No  How often do you need to have someone help you when you read instructions, pamphlets, or other written materials from your doctor or pharmacy?: 1 - Never  Diabetic? no  Interpreter Needed?: No  Information entered by :: NAllen LPN   Activities of Daily Living    10/17/2022    8:45 AM  In your present state of health, do you have any difficulty performing the following activities:  Hearing? 0  Vision? 0  Difficulty concentrating or making decisions? 0  Walking or climbing stairs? 0  Dressing or bathing? 0  Doing errands, shopping? 0  Preparing Food and eating ? N  Using the Toilet? N  In the past six months, have you accidently leaked urine? N  Do you have problems with loss of bowel control? N  Managing your Medications? N  Managing your Finances? N  Housekeeping or managing your Housekeeping? N    Patient Care Team: Dorothyann Peng, MD as PCP - General (Internal Medicine) Jake Bathe, MD as PCP - Cardiology (Cardiology) Olivia Mackie, MD as Consulting Physician (Obstetrics and Gynecology)  Indicate any recent Medical Services you may have received from other than [facility] providers in the past year (date may be approximate).     Assessment:   This is a routine wellness examination for Lessia.  Hearing/Vision screen Vision Screening - Comments:: Regular eye exams, Dr. Alinda Money  Dietary issues and exercise activities discussed: Current Exercise Habits: Structured exercise class, Type of exercise: strength training/weights;calisthenics, Time (Minutes): 40, Frequency  (Times/Week): 3, Weekly Exercise (Minutes/Week): 120   Goals Addressed             This Visit's Progress    Patient Stated       10/21/2022, wants to build muscle and lose 20 pounds       Depression Screen    10/21/2022    2:10 PM 10/04/2022    2:16 PM 09/22/2021    2:27 PM 09/22/2021    2:19 PM 03/09/2021    2:47 PM 02/26/2020    2:21 PM 02/20/2019    2:33 PM  PHQ 2/9 Scores  PHQ - 2 Score 0 0 0 0 0 0 0  PHQ- 9 Score  1    0     Fall Risk    10/17/2022    8:45 AM 10/04/2022    2:16 PM 09/22/2021    2:57 PM 06/17/2021    4:22 PM 02/20/2019    2:33 PM  Fall Risk   Falls in the past year? 0 0 1 0 0  Number falls in past yr: 0 0 0 0   Comment   fell in bathroom while standing on stool, missed a step    Injury with Fall? 0 0 0 0   Risk for fall due to : Medication side effect No Fall Risks  No Fall Risks   Follow up Falls prevention discussed;Education provided;Falls evaluation completed Falls evaluation completed       FALL RISK PREVENTION PERTAINING TO THE HOME:  Any stairs in or around the home? Yes  If so, are there any without handrails? No  Home free of loose throw rugs in walkways, pet beds, electrical cords, etc? Yes  Adequate lighting in your home to reduce risk of falls? Yes   ASSISTIVE DEVICES UTILIZED TO PREVENT FALLS:  Life alert? No  Use of a cane, walker or w/c? No  Grab bars in the bathroom? No  Shower chair or bench in shower? No  Elevated toilet seat or a handicapped toilet? No   TIMED UP AND GO:  Was the test performed? Yes .  Length of time to ambulate 10 feet: 5 sec.   Gait steady and fast without use of assistive device  Cognitive Function:        10/21/2022    2:11 PM 09/22/2021    2:27 PM  6CIT Screen  What Year? 0 points 0 points  What month? 0 points 0 points  What time? 0 points 0 points  Count back from 20 0 points 0 points  Months in reverse 2 points 0 points  Repeat phrase 2 points 4 points  Total Score 4 points 4 points     Immunizations Immunization History  Administered Date(s) Administered   Influenza,inj,Quad PF,6+ Mos 02/20/2019   PFIZER(Purple Top)SARS-COV-2 Vaccination 07/21/2019, 08/11/2019, 03/15/2020   PNEUMOCOCCAL CONJUGATE-20 09/22/2021   Pfizer Covid-19 Vaccine Bivalent Booster 61yrs & up 04/22/2021   Tdap 09/10/2013   Zoster Recombinat (Shingrix) 10/04/2022    TDAP status: Up to date  Flu Vaccine status: Up to date  Pneumococcal vaccine status: Up to date  Covid-19 vaccine status: Completed vaccines  Qualifies for Shingles Vaccine? Yes   Zostavax completed Yes   Shingrix Completed?: need second dose  Screening Tests Health Maintenance  Topic Date Due   Lung Cancer Screening  09/16/2020   COVID-19 Vaccine (5 - 2023-24 season) 01/15/2022   Zoster Vaccines- Shingrix (2 of 2) 11/29/2022   INFLUENZA VACCINE  12/16/2022   DTaP/Tdap/Td (2 - Td or Tdap) 09/11/2023   Medicare Annual Wellness (AWV)  10/21/2023   MAMMOGRAM  09/05/2024   Colonoscopy  08/20/2025   Pneumonia Vaccine 35+ Years old  Completed   DEXA SCAN  Completed   Hepatitis C Screening  Completed   HPV VACCINES  Aged Out    Health Maintenance  Health Maintenance Due  Topic Date Due   Lung Cancer Screening  09/16/2020   COVID-19 Vaccine (5 - 2023-24 season) 01/15/2022    Colorectal cancer screening: Type of screening: Colonoscopy. Completed 08/21/2015. Repeat every 10 years  Mammogram status: Completed 09/06/2022. Repeat every year  Bone Density status: Completed 03/07/2020.   Lung Cancer Screening: (Low Dose CT Chest recommended if Age 56-80 years, 30 pack-year currently smoking OR have quit w/in 15years.) does qualify.   Lung Cancer Screening Referral:  Scheduled for 11/08/2022  Additional Screening:  Hepatitis C Screening: does qualify; Completed 08/27/2013  Vision Screening: Recommended annual ophthalmology exams for early detection of glaucoma and other disorders of the eye. Is the patient up to date  with their annual eye exam?  Yes  Who is the provider or what is the name of the office in which the patient attends annual eye exams? Dr. Lorenza Cambridge If pt is not established with a provider, would they like to be referred to a provider to establish care? No .   Dental Screening: Recommended annual dental exams for proper oral hygiene  Community Resource Referral / Chronic Care Management: CRR required this visit?  No   CCM required this visit?  No      Plan:     I have personally reviewed and noted the following in the patient's chart:   Medical and social history Use of alcohol, tobacco or illicit drugs  Current medications and supplements including opioid prescriptions. Patient is not currently taking opioid prescriptions. Functional ability and status Nutritional status Physical activity Advanced directives List of other physicians  Hospitalizations, surgeries, and ER visits in previous 12 months Vitals Screenings to include cognitive, depression, and falls Referrals and appointments  In addition, I have reviewed and discussed with patient certain preventive protocols, quality metrics, and best practice recommendations. A written personalized care plan for preventive services as well as general preventive health recommendations were provided to patient.     Barb Merino, LPN   4/0/9811   Nurse Notes: none

## 2022-10-25 ENCOUNTER — Ambulatory Visit
Admission: RE | Admit: 2022-10-25 | Discharge: 2022-10-25 | Disposition: A | Discharge status: Home / Self Care | Payer: PPO | Source: Ambulatory Visit | Attending: Internal Medicine | Admitting: Internal Medicine

## 2022-10-25 DIAGNOSIS — Z720 Tobacco use: Secondary | ICD-10-CM | POA: Diagnosis not present

## 2022-10-25 DIAGNOSIS — Z87891 Personal history of nicotine dependence: Secondary | ICD-10-CM

## 2022-10-25 DIAGNOSIS — J40 Bronchitis, not specified as acute or chronic: Secondary | ICD-10-CM | POA: Diagnosis not present

## 2022-10-28 NOTE — Progress Notes (Signed)
[facility] PRE[facility] Weekly Session  Patient Details  Name: Anita Duncan MRN: [id number] Date of Birth: 01-07-1956 Age: 67 y.o. PCP: Dorothyann Peng, MD  Vitals:   10/26/22 1300  Weight: 184 lb 9.6 oz (83.7 kg)     [facility] Weekly seesion - 10/28/22 0800       [facility]PRE[facility]" Location   [facility]      Weekly Session   Topic Discussed Stress management and problem solving   correction to last week topic: resturant eating   Minutes exercised this week 400 minutes   stretches before and after exercise   Classes attended to date 10             Bonnye Fava 10/28/2022, 8:54 AM

## 2022-11-07 ENCOUNTER — Other Ambulatory Visit: Payer: Self-pay | Admitting: Internal Medicine

## 2022-11-08 ENCOUNTER — Other Ambulatory Visit: Payer: PPO

## 2022-11-09 ENCOUNTER — Other Ambulatory Visit: Payer: Self-pay

## 2022-11-09 MED ORDER — LEVOTHYROXINE SODIUM 88 MCG PO TABS
88.0000 ug | ORAL_TABLET | Freq: Every day | ORAL | 0 refills | Status: DC
  Filled 2022-11-09: qty 90, 90d supply, fill #0

## 2022-11-10 ENCOUNTER — Other Ambulatory Visit: Payer: Self-pay

## 2022-11-11 NOTE — Progress Notes (Signed)
[facility] PRE[facility] Weekly Session  Patient Details  Name: Anita Duncan MRN: [id number] Date of Birth: 01-Jan-1956 Age: 67 y.o. PCP: Dorothyann Peng, MD  Vitals:   11/09/22 1300  Weight: 185 lb (83.9 kg)     [facility] Weekly seesion - 11/11/22 0900       [facility]" Location   [facility]      Weekly Session   Topic Discussed --   portions   Minutes exercised this week 400 minutes    Classes attended to date 13             Bonnye Fava 11/11/2022, 9:56 AM

## 2022-11-15 ENCOUNTER — Other Ambulatory Visit: Payer: Self-pay

## 2022-11-15 ENCOUNTER — Ambulatory Visit
Admission: RE | Admit: 2022-11-15 | Discharge: 2022-11-15 | Disposition: A | Discharge status: Home / Self Care | Payer: PPO | Source: Ambulatory Visit | Attending: Urgent Care | Admitting: Urgent Care

## 2022-11-15 VITALS — BP 142/92 | HR 93 | Temp 97.8°F | Resp 18

## 2022-11-15 DIAGNOSIS — R058 Other specified cough: Secondary | ICD-10-CM

## 2022-11-15 DIAGNOSIS — R051 Acute cough: Secondary | ICD-10-CM

## 2022-11-15 MED ORDER — BENZONATATE 100 MG PO CAPS
ORAL_CAPSULE | ORAL | 0 refills | Status: DC
Start: 2022-11-15 — End: 2022-12-08
  Filled 2022-11-15: qty 30, 5d supply, fill #0

## 2022-11-15 NOTE — ED Provider Notes (Signed)
Anita Duncan    CSN: [id number] Arrival date & time: 11/15/22  4098      History   Chief Complaint Chief Complaint  Patient presents with   Cough    allergys get kenalog or prednisone shot.  dud a covid test Sat, 6/29 was negative.  prior week had sweating, headache, runny nose severe cough & tiredness diarrhea.  Drank water consistently all day & night.  Feel a lot better but coughstill present - Entered by patient    HPI Anita Duncan is a 67 y.o. female.    Cough   Patient presents to urgent care with concern for cough x 5 days.  She is using over-the-counter treatment including cough medication as well as supplements.  She endorses onset of flulike symptoms.  Endorses subjective fever with sweating, headache, rhinorrhea, fatigue, diarrhea.  She states all symptoms resolved but except for cough.  She is using "Robitussin" and states this gives her relief at night.  Past Medical History:  Diagnosis Date   Allergy    Anemia During pregnancy 1984.  Not present anymore   Cataract Microscopic one.  Eye exams annually to make sure   Heart murmur Was told not severe.  We all have one some worse t   Hypothyroidism    Neuromuscular disorder (HCC) 2009   Injury on the job.  Currently being treated, ortho doctor.,   Seasonal allergies     Patient Active Problem List   Diagnosis Date Noted   Primary hypothyroidism 03/09/2021   Elevated blood pressure reading 03/09/2021   Atherosclerosis of aorta (HCC) 03/09/2021   Other abnormal glucose 03/09/2021   Intertrigo 03/09/2021   Class 1 obesity due to excess calories with serious comorbidity and body mass index (BMI) of 33.0 to 33.9 in adult 03/09/2021   Impingement syndrome of right shoulder 06/21/2016    Past Surgical History:  Procedure Laterality Date   BREAST BIOPSY Right    BREAST BIOPSY Right    BREAST EXCISIONAL BIOPSY Right    CESAREAN SECTION     TONSILLECTOMY      OB History   No obstetric history on  file.      Home Medications    Prior to Admission medications   Medication Sig Start Date End Date Taking? Authorizing Provider  EPINEPHrine 0.3 mg/0.3 mL IJ SOAJ injection Inject 0.3 mg into the muscle as needed for anaphylaxis. Patient not taking: Reported on 10/04/2022 07/30/21   Marcelyn Bruins, MD  fluticasone (FLONASE) 50 MCG/ACT nasal spray SPRAY 1 SPRAY INTO BOTH NOSTRILS DAILY. Patient not taking: Reported on 10/21/2022 02/09/21   Dorothyann Peng, MD  levocetirizine (XYZAL) 5 MG tablet Take 1 tablet (5 mg total) by mouth every evening. 03/27/22   Dorothyann Peng, MD  levothyroxine (SYNTHROID) 88 MCG tablet Take 1 tablet (88 mcg total) by mouth daily on Monday through Saturdays and 1/2 tablet on sundays 11/09/22   Dorothyann Peng, MD  nabumetone (RELAFEN) 750 MG tablet TAKE 1 TABLET BY MOUTH TWICE  DAILY WITH FOOD AS NEEDED FOR  MILD OR MODERATE PAIN Patient not taking: Reported on 10/04/2022 06/03/21   Adonis Huguenin, NP  rosuvastatin (CRESTOR) 20 MG tablet Take 1 tablet by mouth daily on Monday-Saturday, skip Sundays 09/21/22   Dorothyann Peng, MD  RYALTRIS [id number] MCG/ACT SUSP Place 2 puffs into the nose daily as needed. Patient not taking: Reported on 10/04/2022 08/03/21   Marcelyn Bruins, MD  Vitamin D, Ergocalciferol, (DRISDOL) 1.25 MG (50000 UNIT) CAPS  capsule TAKE 1 CAPSULE BY MOUTH ON TUESDAYS AND FRIDAYS Patient not taking: Reported on 10/04/2022 08/25/21   Dorothyann Peng, MD    Family History Family History  Problem Relation Age of Onset   Allergic rhinitis Mother    Hearing loss Mother    Heart disease Mother    Hypertension Mother    Heart disease Father    Breast cancer Cousin 23   Asthma Son    Cancer Maternal Grandfather    Miscarriages / Stillbirths Maternal Grandmother    Stroke Maternal Grandmother    Arthritis Maternal Aunt    Stroke Maternal Aunt    Vision loss Maternal Aunt    Arthritis Paternal Aunt    Asthma Son    Diabetes Maternal Aunt     Heart disease Maternal Aunt    Hypertension Maternal Aunt     Social History Social History   Tobacco Use   Smoking status: Former    Packs/day: 0.50    Years: 18.00    Additional pack years: 0.00    Total pack years: 9.00    Types: Cigarettes    Start date: 58    Quit date: 09/03/2019    Years since quitting: 3.2    Passive exposure: Never   Smokeless tobacco: Former   Tobacco comments:    Cutting back.  Prefer to do cold [place].  Smoked off & on.  Some yrs I quit 6 mos - 1 yr.   Smoked  Vaping Use   Vaping Use: Never used  Substance Use Topics   Alcohol use: Not Currently    Alcohol/week: 1.0 - 2.0 standard drink of alcohol    Types: 1 - 2 Glasses of wine per week    Comment: A glass of wine special occasions.  1/2 glasses e/blue moon   Drug use: No     Allergies   Amoxicillin, Ceftin [cefuroxime], Codeine, and Sulfa antibiotics   Review of Systems Review of Systems  Respiratory:  Positive for cough.      Physical Exam Triage Vital Signs ED Triage Vitals [11/15/22 0852]  Enc Vitals Group     BP      Pulse      Resp      Temp      Temp src      SpO2      Weight      Height      Head Circumference      Peak Flow      Pain Score 0     Pain Loc      Pain Edu?      Excl. in GC?    No data found.  Updated Vital Signs There were no vitals taken for this visit.  Visual Acuity Right Eye Distance:   Left Eye Distance:   Bilateral Distance:    Right Eye Near:   Left Eye Near:    Bilateral Near:     Physical Exam Vitals reviewed.  Constitutional:      Appearance: Normal appearance.  Cardiovascular:     Rate and Rhythm: Normal rate and regular rhythm.     Pulses: Normal pulses.     Heart sounds: Normal heart sounds.  Pulmonary:     Effort: Pulmonary effort is normal.     Breath sounds: Normal breath sounds.  Skin:    General: Skin is warm and dry.  Neurological:     General: No focal deficit present.     Mental Status: She is  alert and  oriented to person, place, and time.  Psychiatric:        Mood and Affect: Mood normal.        Behavior: Behavior normal.      UC Treatments / Results  Labs (all labs ordered are listed, but only abnormal results are displayed) Labs Reviewed - No data to display  EKG   Radiology No results found.  Procedures Procedures (including critical care time)  Medications Ordered in UC Medications - No data to display  Initial Impression / Assessment and Plan / UC Course  I have reviewed the triage vital signs and the nursing notes.  Pertinent labs & imaging results that were available during my care of the patient were reviewed by me and considered in my medical decision making (see chart for details).   Anita Duncan is a 67 y.o. female presenting with cough. Patient is afebrile without recent antipyretics, satting well on room air. Overall is well appearing though non-toxic, well hydrated, without respiratory distress. Pulmonary exam is unremarkable.  Lungs CTAB without wheezing, rhonchi, rales. RRR without murmurs, rubs, gallops.  Reviewed relevant chart history.   Patient's symptoms are most consistent with post viral cough syndrome.  No red flag symptoms.  Recommending continued use of over-the-counter medication including cough suppressants.  Will provide prescription for benzonatate for her to use additionally.  Counseled patient on potential for adverse effects with medications prescribed/recommended today, ER and return-to-clinic precautions discussed, patient verbalized understanding and agreement with care plan.  Final Clinical Impressions(s) / UC Diagnoses   Final diagnoses:  None   Discharge Instructions   None    ED Prescriptions   None    PDMP not reviewed this encounter.   Charma Igo, [place] 11/15/22 [phone]

## 2022-11-15 NOTE — Discharge Instructions (Addendum)
Recommend continued use of over-the-counter cough suppressants containing dextromethorphan to control your symptoms.  I have prescribed an additional cough suppressant which you may also use along with the over-the-counter medication.

## 2022-11-15 NOTE — ED Triage Notes (Signed)
Patient presents to UC for cough since last weds. States she is improving by taking oil, oregano, and robitussin. Negative rapid test.

## 2022-11-24 NOTE — Progress Notes (Signed)
[facility] PRE[facility] Weekly Session  Patient Details  Name: Anita Duncan MRN: [id number] Date of Birth: Jan 15, 1956 Age: 67 y.o. PCP: Dorothyann Peng, MD  Vitals:   11/23/22 1300  Weight: 183 lb 3.2 oz (83.1 kg)     [facility] Weekly seesion - 11/24/22 1500       [facility]PREP" Location   [facility]      Weekly Session   Topic Discussed Calorie breakdown    Minutes exercised this week --   was ill   Classes attended to date 15             Bonnye Fava 11/24/2022, 3:42 PM

## 2022-12-01 NOTE — Progress Notes (Signed)
[facility] PREP Weekly Session  Patient Details  Name: Anita Duncan MRN: [id number] Date of Birth: Aug 12, 1955 Age: 67 y.o. PCP: Dorothyann Peng, MD  Vitals:   11/30/22 1300  Weight: 184 lb 9.6 oz (83.7 kg)     [facility] Weekly seesion - 12/01/22 1500       [facility]PREP" Location   [facility]      Weekly Session   Topic Discussed Hitting roadblocks    Minutes exercised this week 240 minutes    Classes attended to date 17             Bonnye Fava 12/01/2022, 3:36 PM

## 2022-12-08 ENCOUNTER — Encounter: Payer: Self-pay | Admitting: Nurse Practitioner

## 2022-12-08 ENCOUNTER — Telehealth (INDEPENDENT_AMBULATORY_CARE_PROVIDER_SITE_OTHER): Payer: PPO | Admitting: Nurse Practitioner

## 2022-12-08 ENCOUNTER — Other Ambulatory Visit: Payer: Self-pay

## 2022-12-08 DIAGNOSIS — U071 COVID-19: Secondary | ICD-10-CM | POA: Diagnosis not present

## 2022-12-08 MED ORDER — BENZONATATE 100 MG PO CAPS
ORAL_CAPSULE | ORAL | 0 refills | Status: DC
  Filled 2022-12-08: qty 30, 10d supply, fill #0

## 2022-12-08 NOTE — Assessment & Plan Note (Signed)
Declines antiviral treatment at this time, has mild symptoms and is improving. Will send prescription for cough (tessalon perles).

## 2022-12-08 NOTE — Progress Notes (Signed)
Virtual Visit via Mychart   This visit type was conducted due to national recommendations for restrictions regarding the COVID-19 Pandemic (e.g. social distancing) in an effort to limit this patient's exposure and mitigate transmission in our community.  Due to her co-morbid illnesses, this patient is at least at moderate risk for complications without adequate follow up.  This format is felt to be most appropriate for this patient at this time.  All issues noted in this document were discussed and addressed.  A limited physical exam was performed with this format.    This visit type was conducted due to national recommendations for restrictions regarding the COVID-19 Pandemic (e.g. social distancing) in an effort to limit this patient's exposure and mitigate transmission in our community.  Patients identity confirmed using two different identifiers.  This format is felt to be most appropriate for this patient at this time.  All issues noted in this document were discussed and addressed.  No physical exam was performed (except for noted visual exam findings with Video Visits).    Date:  12/08/2022   ID:  Anita Duncan, DOB 05/30/1955, MRN [id number]  Patient Location:  Home - spoke with Anita Duncan  Provider location:   Office    Chief Complaint:  positive covid  History of Present Illness:    Anita Duncan is a 67 y.o. female who presents via video conferencing for a telehealth visit today.    The patient does have symptoms concerning for COVID-19 infection (fever, chills, cough, or new shortness of breath).   Patient presents today for a positive at home covid test. Patient reports her symptoms started on Monday. Patient tested positive yesterday.Patients current symptoms are headache,sinus pressure, cough, nasal congestion, sore throat. She took her xyzal and allegr a. She drank a lot of water. 3 weeks prior she went to urgent care lost her taste and fatigue. Initially she felt  like it was a sinus infection. She is starting to feel better.      Past Medical History:  Diagnosis Date   Allergy    Anemia During pregnancy 1984.  Not present anymore   Cataract Microscopic one.  Eye exams annually to make sure   Heart murmur Was told not severe.  We all have one some worse t   Hypothyroidism    Neuromuscular disorder (HCC) 2009   Injury on the job.  Currently being treated, ortho doctor.,   Seasonal allergies    Past Surgical History:  Procedure Laterality Date   BREAST BIOPSY Right    BREAST BIOPSY Right    BREAST EXCISIONAL BIOPSY Right    CESAREAN SECTION     TONSILLECTOMY       Current Meds  Medication Sig   levocetirizine (XYZAL) 5 MG tablet Take 1 tablet (5 mg total) by mouth every evening.   levothyroxine (SYNTHROID) 88 MCG tablet Take 1 tablet (88 mcg total) by mouth daily on Monday through Saturdays and 1/2 tablet on sundays   rosuvastatin (CRESTOR) 20 MG tablet Take 1 tablet by mouth daily on Monday-Saturday, skip Sundays     Allergies:   Amoxicillin, Ceftin [cefuroxime], Codeine, and Sulfa antibiotics   Social History   Tobacco Use   Smoking status: Former    Current packs/day: 0.00    Average packs/day: 0.5 packs/day for 43.3 years (21.6 ttl pk-yrs)    Types: Cigarettes    Start date: 36    Quit date: 09/03/2019    Years since quitting: 3.2  Passive exposure: Never   Smokeless tobacco: Former   Tobacco comments:    Cutting back.  Prefer to do cold [place].  Smoked off & on.  Some yrs I quit 6 mos - 1 yr.   Smoked  Vaping Use   Vaping status: Never Used  Substance Use Topics   Alcohol use: Not Currently    Alcohol/week: 1.0 - 2.0 standard drink of alcohol    Types: 1 - 2 Glasses of wine per week    Comment: A glass of wine special occasions.  1/2 glasses e/blue moon   Drug use: No     Family Hx: The patient's family history includes Allergic rhinitis in her mother; Arthritis in her maternal aunt and paternal aunt; Asthma in  her son and son; Breast cancer (age of onset: 24) in her cousin; Cancer in her maternal grandfather; Diabetes in her maternal aunt; Hearing loss in her mother; Heart disease in her father, maternal aunt, and mother; Hypertension in her maternal aunt and mother; Miscarriages / Stillbirths in her maternal grandmother; Stroke in her maternal aunt and maternal grandmother; Vision loss in her maternal aunt.  ROS:   Please see the history of present illness.    Review of Systems  Constitutional:  Positive for chills. Negative for fever and malaise/fatigue.  Respiratory:  Positive for cough and sputum production (clear). Negative for shortness of breath and wheezing.   Musculoskeletal:  Negative for myalgias.  Neurological:  Positive for headaches. Negative for dizziness.    All other systems reviewed and are negative.   Labs/Other Tests and Data Reviewed:    Recent Labs: 10/04/2022: ALT 19; BUN 12; Creatinine, Ser 0.90; Hemoglobin 12.0; Platelets 236; Potassium 4.5; Sodium 139; TSH 0.551   Recent Lipid Panel Lab Results  Component Value Date/Time   CHOL 159 10/04/2022 03:03 PM   TRIG 57 10/04/2022 03:03 PM   HDL 78 10/04/2022 03:03 PM   CHOLHDL 2.0 10/04/2022 03:03 PM   LDLCALC 69 10/04/2022 03:03 PM    Wt Readings from Last 3 Encounters:  11/30/22 184 lb 9.6 oz (83.7 kg)  11/23/22 183 lb 3.2 oz (83.1 kg)  11/09/22 185 lb (83.9 kg)     Exam:    Vital Signs:  There were no vitals taken for this visit.    Physical Exam Vitals reviewed.  Constitutional:      General: She is not in acute distress.    Appearance: Normal appearance.  Pulmonary:     Effort: Pulmonary effort is normal. No respiratory distress.  Neurological:     General: No focal deficit present.     Mental Status: She is alert and oriented to person, place, and time. Mental status is at baseline.     Cranial Nerves: No cranial nerve deficit.  Psychiatric:        Mood and Affect: Mood and affect normal.         Behavior: Behavior normal.        Thought Content: Thought content normal.        Cognition and Memory: Memory normal.        Judgment: Judgment normal.     ASSESSMENT & PLAN:    Positive self-administered antigen test for COVID-19 Assessment & Plan: Declines antiviral treatment at this time, has mild symptoms and is improving. Will send prescription for cough (tessalon perles).   Orders: -     Benzonatate; Take 1-2 tablets 3 times a day as needed for cough  Dispense: 30 capsule; Refill: 0  COVID-19 Education: The signs and symptoms of COVID-19 were discussed with the patient and how to seek care for testing (follow up with PCP or arrange E-visit).  The importance of social distancing was discussed today.  Patient Risk:   After full review of this patients clinical status, I feel that they are at least moderate risk at this time.  Time:   Today, I have spent 7.15 minutes/ seconds with the patient with telehealth technology discussing above diagnoses.     Medication Adjustments/Labs and Tests Ordered: Current medicines are reviewed at length with the patient today.  Concerns regarding medicines are outlined above.   Tests Ordered: No orders of the defined types were placed in this encounter.   Medication Changes: Meds ordered this encounter  Medications   benzonatate (TESSALON) 100 MG capsule    Sig: Take 1-2 tablets 3 times a day as needed for cough    Dispense:  30 capsule    Refill:  0    Disposition:  Follow up prn  Signed, Arnette Felts, FNP

## 2022-12-08 NOTE — Patient Instructions (Signed)
Advised patient to take Vitamin C, D, Zinc.  Keep yourself hydrated with a lot of water and rest. Take Delsym for cough and Mucinex as need. Take Tylenol or pain reliever every 4-6 hours as needed for pain/fever/body ache. If you have elevated blood pressure, you can take OTC Corcidin. You can also take OTC oscillococcinum to help with your symptoms.  Educated patient if symptoms get worse or if she experiences any SOB, chest pain or pain in her legs to seek immediate emergency care. Continue to monitor your oxygen levels. Call us if you have any questions. Quarantine for 5 days if symptoms have improved and wear mask up to 10 days.

## 2022-12-10 ENCOUNTER — Other Ambulatory Visit: Payer: Self-pay

## 2022-12-20 ENCOUNTER — Encounter: Payer: Self-pay | Admitting: Internal Medicine

## 2022-12-21 ENCOUNTER — Other Ambulatory Visit (HOSPITAL_COMMUNITY): Payer: Self-pay

## 2022-12-21 ENCOUNTER — Other Ambulatory Visit: Payer: Self-pay | Admitting: Nurse Practitioner

## 2022-12-21 MED ORDER — HYDROCOD POLI-CHLORPHE POLI ER 10-8 MG/5ML PO SUER
5.0000 mL | Freq: Two times a day (BID) | ORAL | 0 refills | Status: DC | PRN
  Filled 2022-12-21 – 2022-12-22 (×2): qty 115, 12d supply, fill #0

## 2022-12-22 ENCOUNTER — Other Ambulatory Visit: Payer: Self-pay

## 2022-12-22 ENCOUNTER — Other Ambulatory Visit (HOSPITAL_COMMUNITY): Payer: Self-pay

## 2022-12-28 NOTE — Progress Notes (Signed)
[facility] PREP Evaluation  Patient Details  Name: Anita Duncan MRN: [id number] Date of Birth: [birth year] Age: 67 y.o. PCP: Dorothyann Peng, MD  Vitals:   12/28/22 1400  BP: 120/78  Pulse: (!) 110  SpO2: 96%  Weight: 181 lb 6.4 oz (82.3 kg)     [facility] Eval - 12/28/22 1600       [facility] "PREP" Location   [facility] "[facility]" Location [facility]      Referral    Referring Provider Allyne Gee    Program Start Date 09/14/22    Program End Date 12/16/22      Measurement   Waist Circumference 41 inches    Waist Circumference End Program 40 inches    Hip Circumference 43 inches    Hip Circumference End Program 41 inches    Body fat 43 percent      Information for Trainer   Goals continue to exercise      Mobility and Daily Activities   I find it easy to walk up or down two or more flights of stairs. 3    I have no trouble taking out the trash. 4    I do housework such as vacuuming and dusting on my own without difficulty. 2    I can easily lift a gallon of milk (8lbs). 3    I can easily walk a mile. 2    I have no trouble reaching into high cupboards or reaching down to pick up something from the floor. 2    I do not have trouble doing out-door work such as Loss adjuster, chartered, raking leaves, or gardening. 3      Mobility and Daily Activities   I feel younger than my age. 4    I feel independent. 4    I feel energetic. 3    I live an active life.  3    I feel strong. 3    I feel healthy. 3    I feel active as other people my age. 2      How fit and strong are you.   Fit and Strong Total Score 41            Past Medical History:  Diagnosis Date   Allergy    Anemia During pregnancy 1984.  Not present anymore   Cataract Microscopic one.  Eye exams annually to make sure   Heart murmur Was told not severe.  We all have one some worse t   Hypothyroidism    Neuromuscular disorder (HCC) 2009   Injury on the job.  Currently being treated, ortho doctor.,   Seasonal allergies     Past Surgical History:  Procedure Laterality Date   BREAST BIOPSY Right    BREAST BIOPSY Right    BREAST EXCISIONAL BIOPSY Right    CESAREAN SECTION     TONSILLECTOMY     Social History   Tobacco Use  Smoking Status Former   Current packs/day: 0.00   Average packs/day: 0.5 packs/day for 43.3 years (21.6 ttl pk-yrs)   Types: Cigarettes   Start date: 64   Quit date: 09/03/2019   Years since quitting: 3.3   Passive exposure: Never  Smokeless Tobacco Former  Tobacco Comments   Cutting back.  Prefer to do cold [place].  Smoked off & on.  Some yrs I quit 6 mos - 1 yr.   Smoked   Attended 20 sessions/11 educational Fit testing: Cardio march: 306 to 380 Sit to stand: 12 to  15 Bicep curl: 10 to 15 Encouraged to exercise and stay focused on goals  Bonnye Fava 12/28/2022, 4:43 PM

## 2023-01-20 ENCOUNTER — Ambulatory Visit: Payer: PPO | Admitting: Family Medicine

## 2023-01-27 ENCOUNTER — Other Ambulatory Visit: Payer: Self-pay | Admitting: Internal Medicine

## 2023-01-28 ENCOUNTER — Other Ambulatory Visit: Payer: Self-pay

## 2023-01-31 ENCOUNTER — Other Ambulatory Visit: Payer: Self-pay

## 2023-01-31 MED ORDER — LEVOTHYROXINE SODIUM 88 MCG PO TABS
88.0000 ug | ORAL_TABLET | Freq: Every day | ORAL | 0 refills | Status: DC
  Filled 2023-02-07 (×2): qty 90, 90d supply, fill #0

## 2023-01-31 MED ORDER — FLUTICASONE PROPIONATE 50 MCG/ACT NA SUSP
1.0000 | Freq: Every day | NASAL | 1 refills | Status: AC
  Filled 2023-01-31: qty 48, 90d supply, fill #0
  Filled 2023-06-19: qty 48, 90d supply, fill #1

## 2023-01-31 MED ORDER — LEVOCETIRIZINE DIHYDROCHLORIDE 5 MG PO TABS
5.0000 mg | ORAL_TABLET | Freq: Every evening | ORAL | 1 refills | Status: DC
  Filled 2023-02-07 (×2): qty 90, 90d supply, fill #0
  Filled 2023-06-19: qty 90, 90d supply, fill #1

## 2023-02-03 ENCOUNTER — Other Ambulatory Visit: Payer: Self-pay | Admitting: Pharmacist

## 2023-02-03 NOTE — Progress Notes (Signed)
Pharmacy Quality Measure Review  This patient is appearing on a report for being at risk of failing the adherence measure for cholesterol (statin) medications this calendar year.   Medication: rosuvastatin 20 mg every day except Sunday Last fill date: 09/22/22 for 87 day supply, although insurance claim says 75 day supply  Spoke with patient on the phone and she reported adherence to rosuvastatin. Stated she had an extra bottle of rosuvastatin at home she has been taking. Notes she is going to run out soon, but has already arranged to pick up her rosuvastatin and levothyroxine from pharmacy on 02/07/23. No further action needed at this time.  Jarrett Ables, PharmD PGY-1 Pharmacy Resident

## 2023-02-07 ENCOUNTER — Other Ambulatory Visit: Payer: Self-pay

## 2023-02-08 ENCOUNTER — Other Ambulatory Visit (HOSPITAL_BASED_OUTPATIENT_CLINIC_OR_DEPARTMENT_OTHER): Payer: Self-pay

## 2023-02-08 ENCOUNTER — Other Ambulatory Visit: Payer: Self-pay

## 2023-02-23 ENCOUNTER — Ambulatory Visit: Payer: PPO

## 2023-02-23 VITALS — BP 130/80 | HR 85 | Temp 98.1°F | Ht 62.0 in | Wt 181.0 lb

## 2023-02-23 DIAGNOSIS — R87810 Cervical high risk human papillomavirus (HPV) DNA test positive: Secondary | ICD-10-CM | POA: Diagnosis not present

## 2023-02-23 DIAGNOSIS — Z23 Encounter for immunization: Secondary | ICD-10-CM

## 2023-02-23 DIAGNOSIS — N951 Menopausal and female climacteric states: Secondary | ICD-10-CM | POA: Diagnosis not present

## 2023-02-23 DIAGNOSIS — Z124 Encounter for screening for malignant neoplasm of cervix: Secondary | ICD-10-CM | POA: Diagnosis not present

## 2023-02-23 DIAGNOSIS — Z01419 Encounter for gynecological examination (general) (routine) without abnormal findings: Secondary | ICD-10-CM | POA: Diagnosis not present

## 2023-02-23 DIAGNOSIS — Z01411 Encounter for gynecological examination (general) (routine) with abnormal findings: Secondary | ICD-10-CM | POA: Diagnosis not present

## 2023-02-23 DIAGNOSIS — E669 Obesity, unspecified: Secondary | ICD-10-CM | POA: Diagnosis not present

## 2023-02-23 NOTE — Patient Instructions (Signed)

## 2023-02-23 NOTE — Progress Notes (Signed)
Patient presents today for second shingles vaccine.  

## 2023-04-04 ENCOUNTER — Encounter: Payer: Self-pay | Admitting: Cardiology

## 2023-04-04 ENCOUNTER — Ambulatory Visit: Payer: PPO | Attending: Cardiology | Admitting: Cardiology

## 2023-04-04 VITALS — BP 140/108 | HR 81 | Ht 62.0 in | Wt 181.0 lb

## 2023-04-04 DIAGNOSIS — I2510 Atherosclerotic heart disease of native coronary artery without angina pectoris: Secondary | ICD-10-CM | POA: Diagnosis not present

## 2023-04-04 DIAGNOSIS — I700 Atherosclerosis of aorta: Secondary | ICD-10-CM

## 2023-04-04 DIAGNOSIS — E7800 Pure hypercholesterolemia, unspecified: Secondary | ICD-10-CM | POA: Diagnosis not present

## 2023-04-04 NOTE — Progress Notes (Signed)
Cardiology Office Note:  .   Date:  04/04/2023  ID:  Gavin Potters, DOB 1955-10-26, MRN [id number] PCP: Dorothyann Peng, MD  [facility] Providers Cardiologist:  Donato Schultz, MD     History of Present Illness: .   Anita Duncan is a 67 y.o. female Discussed the use of AI scribe   History of Present Illness   The patient, a 67 year old with a history of coronary artery disease and hyperlipidemia, presents for a follow-up visit. She has a significant family history of cardiac disease, with her father passing away from a myocardial infarction at age 27 and a sister diagnosed with a heart defect at age 56. She also reports a vegan diet.  The patient has been experiencing situational stress due to caring for her mother, who is showing signs of aggression and possible dementia, and her son, who has lupus. This stress has led to an increase in her blood pressure, which she attributes to the situational stress. She has not been taking her rosuvastatin as regularly as prescribed due to fatigue and forgetfulness.  The patient also reports a recent bout of COVID-19, which she contracted around the same time as her mother. She has since recovered but has not been able to resume her exercise regimen due to caring for her mother and recovering from cataract surgery. Despite these challenges, the patient is committed to maintaining her health to care for her family.  The patient's last echocardiogram in 2021 showed an EF of 65%, and a CT scan in 2024 showed evidence of coronary artery calcifications. Despite these findings, the patient's cholesterol levels have improved significantly on rosuvastatin 20mg  daily.            Studies Reviewed: Marland Kitchen   EKG Interpretation Date/Time:  Monday April 04 2023 15:45:57 EST Ventricular Rate:  74 PR Interval:  134 QRS Duration:  78 QT Interval:  380 QTC Calculation: 421 R Axis:   30  Text Interpretation: Normal sinus rhythm Normal ECG When  compared with ECG of 04-Oct-2006 01:41, No significant change was found Confirmed by Donato Schultz ([id number]) on 04/04/2023 3:50:46 PM    Risk Assessment/Calculations:           Physical Exam:   VS:  BP (!) 140/108   Pulse 81   Ht 5\' 2"  (1.575 m)   Wt 181 lb (82.1 kg)   SpO2 97%   BMI 33.11 kg/m    Wt Readings from Last 3 Encounters:  04/04/23 181 lb (82.1 kg)  02/23/23 181 lb (82.1 kg)  12/28/22 181 lb 6.4 oz (82.3 kg)    GEN: Well nourished, well developed in no acute distress NECK: No JVD; No carotid bruits CARDIAC: RRR, no murmurs, no rubs, no gallops RESPIRATORY:  Clear to auscultation without rales, wheezing or rhonchi  ABDOMEN: Soft, non-tender, non-distended EXTREMITIES:  No edema; No deformity   ASSESSMENT AND PLAN: .    Assessment and Plan    Coronary Artery Disease (CAD) Follow-up for CAD with significant family history. LDL improved from 131 to 58 with rosuvastatin 20 mg daily. Echo in 2021 showed EF of 65%. Evidence of coronary artery calcifications on CT scan on 10/25/2022. Reports situational stress affecting blood pressure. Discussed medication adherence and regular exercise. Explained rosuvastatin's role in reducing LDL and cardiovascular risk. - Continue rosuvastatin 20 mg daily - Monitor blood pressure at home - Encourage adherence to medication regimen - Encourage regular exercise and stress management  Hyperlipidemia Well-controlled LDL at 58 with rosuvastatin  20 mg daily. Significant family history. Discussed continued medication adherence to maintain LDL levels and reduce cardiovascular risk. - Continue rosuvastatin 20 mg daily - Encourage adherence to medication regimen - Monitor lipid levels regularly  Hypertension Elevated blood pressure likely due to situational stress. Recent reading of 138/overnight. No chronic hypertension. Discussed home blood pressure monitoring and stress management. - Monitor blood pressure at home - Encourage stress  management techniques  Hypothyroidism Regular TSH monitoring by primary care physician. Discussed importance of regular monitoring to ensure stable thyroid function. - Continue current thyroid medication - Monitor TSH levels regularly  General Health Maintenance Follows a vegan diet and completed a 12-week exercise course. Reports situational stress due to caregiving responsibilities. Discussed regular exercise and stress management to maintain overall health. - Encourage regular exercise - Encourage stress management techniques - Monitor for signs of caregiver burnout  Follow-up - Schedule follow-up appointment in May.               Signed, Donato Schultz, MD

## 2023-04-04 NOTE — Patient Instructions (Signed)

## 2023-04-06 ENCOUNTER — Encounter: Payer: Self-pay | Admitting: Internal Medicine

## 2023-04-06 ENCOUNTER — Ambulatory Visit: Payer: PPO | Admitting: Internal Medicine

## 2023-04-06 ENCOUNTER — Other Ambulatory Visit: Payer: Self-pay

## 2023-04-06 VITALS — BP 124/82 | HR 97 | Temp 97.8°F | Ht 62.0 in | Wt 184.6 lb

## 2023-04-06 DIAGNOSIS — R7309 Other abnormal glucose: Secondary | ICD-10-CM | POA: Diagnosis not present

## 2023-04-06 DIAGNOSIS — E6609 Other obesity due to excess calories: Secondary | ICD-10-CM | POA: Diagnosis not present

## 2023-04-06 DIAGNOSIS — I2510 Atherosclerotic heart disease of native coronary artery without angina pectoris: Secondary | ICD-10-CM

## 2023-04-06 DIAGNOSIS — Z2821 Immunization not carried out because of patient refusal: Secondary | ICD-10-CM | POA: Diagnosis not present

## 2023-04-06 DIAGNOSIS — Z79899 Other long term (current) drug therapy: Secondary | ICD-10-CM | POA: Diagnosis not present

## 2023-04-06 DIAGNOSIS — Z6833 Body mass index (BMI) 33.0-33.9, adult: Secondary | ICD-10-CM

## 2023-04-06 DIAGNOSIS — I2583 Coronary atherosclerosis due to lipid rich plaque: Secondary | ICD-10-CM | POA: Diagnosis not present

## 2023-04-06 DIAGNOSIS — E039 Hypothyroidism, unspecified: Secondary | ICD-10-CM | POA: Diagnosis not present

## 2023-04-06 DIAGNOSIS — I700 Atherosclerosis of aorta: Secondary | ICD-10-CM

## 2023-04-06 DIAGNOSIS — E66811 Obesity, class 1: Secondary | ICD-10-CM | POA: Diagnosis not present

## 2023-04-06 MED ORDER — ROSUVASTATIN CALCIUM 20 MG PO TABS
ORAL_TABLET | ORAL | 1 refills | Status: DC
  Filled 2023-04-06: qty 75, fill #0
  Filled 2023-04-18: qty 75, 87d supply, fill #0
  Filled 2023-08-10: qty 75, 87d supply, fill #1

## 2023-04-06 NOTE — Assessment & Plan Note (Signed)
Chronic, LDL goal less than 70. Encouraged to follow heart healthy lifestyle.

## 2023-04-06 NOTE — Assessment & Plan Note (Addendum)
Chronic, LDL goal is less than 70 .  Most recent Cardiology note reviewed. She will continue with rosuvastatin 20mg  M-Saturday. Encouraged to follow heart healthy lifestyle. Per patient report, advised by Cardiology to not take ASA 81mg  daily.

## 2023-04-06 NOTE — Assessment & Plan Note (Signed)
Chronic, she is currently taking Synthroid daily M-Sat and 1/2 tab on Sundays.  I will check thyroid panel and adjust meds as needed.

## 2023-04-06 NOTE — Assessment & Plan Note (Signed)
Her a1c has been elevated in the past .I will recheck this today. She is encouraged to limit her intake of sweetened beverages, including diet drinks.

## 2023-04-06 NOTE — Patient Instructions (Signed)

## 2023-04-06 NOTE — Assessment & Plan Note (Signed)
She is encouraged to strive for BMI less than 30 to decrease cardiac risk. Advised to aim for at least 150 minutes of exercise per week.

## 2023-04-06 NOTE — Progress Notes (Signed)
I,Anita Duncan, CMA,acting as a Neurosurgeon for Anita Aliment, MD.,have documented all relevant documentation on the behalf of Anita Aliment, MD,as directed by  Anita Aliment, MD while in the presence of Anita Aliment, MD.  Subjective:  Patient ID: Anita Duncan , female    DOB: 1955/08/06 , 67 y.o.   MRN: [id number]  Chief Complaint  Patient presents with   Hypothyroidism    HPI  Patient presents today for a thyroid check. She reports compliance with medications.  She is the primary caregiver for her mother, this has been stressful for her recently. She does not have any specific questions or concerns.   Hyperlipidemia This is a chronic problem. The current episode started more than 1 year ago. The problem is controlled. Exacerbating diseases include hypothyroidism. She has no history of chronic renal disease. Current antihyperlipidemic treatment includes statins, diet change and exercise. Risk factors for coronary artery disease include dyslipidemia and post-menopausal.     Past Medical History:  Diagnosis Date   Allergy    Anemia During pregnancy 1984.  Not present anymore   Cataract Microscopic one.  Eye exams annually to make sure   Heart murmur Was told not severe.  We all have one some worse t   Hypothyroidism    Neuromuscular disorder (HCC) 2009   Injury on the job.  Currently being treated, ortho doctor.,   Seasonal allergies      Family History  Problem Relation Age of Onset   Allergic rhinitis Mother    Hearing loss Mother    Heart disease Mother    Hypertension Mother    Heart disease Father    Breast cancer Cousin 48   Asthma Son    Cancer Maternal Grandfather    Miscarriages / Stillbirths Maternal Grandmother    Stroke Maternal Grandmother    Arthritis Maternal Aunt    Stroke Maternal Aunt    Vision loss Maternal Aunt    Arthritis Paternal Aunt    Asthma Son    Diabetes Maternal Aunt    Heart disease Maternal Aunt    Hypertension Maternal Aunt       Current Outpatient Medications:    benzonatate (TESSALON) 100 MG capsule, Take 1-2 tablets 3 times a day as needed for cough, Disp: 30 capsule, Rfl: 0   chlorpheniramine-HYDROcodone (TUSSIONEX) 10-8 MG/5ML, Take 5 mLs by mouth every 12 (twelve) hours as needed for cough., Disp: 115 mL, Rfl: 0   fluticasone (FLONASE) 50 MCG/ACT nasal spray, Place 1 spray into both nostrils daily., Disp: 48 g, Rfl: 1   levocetirizine (XYZAL) 5 MG tablet, Take 1 tablet (5 mg total) by mouth every evening., Disp: 90 tablet, Rfl: 1   levothyroxine (SYNTHROID) 88 MCG tablet, Take 1 tablet (88 mcg total) by mouth daily on Monday through Saturdays and 1/2 tablet on sundays, Disp: 90 tablet, Rfl: 0   EPINEPHrine 0.3 mg/0.3 mL IJ SOAJ injection, Inject 0.3 mg into the muscle as needed for anaphylaxis. (Patient not taking: Reported on 10/04/2022), Disp: 1 each, Rfl: 1   nabumetone (RELAFEN) 750 MG tablet, TAKE 1 TABLET BY MOUTH TWICE  DAILY WITH FOOD AS NEEDED FOR  MILD OR MODERATE PAIN (Patient not taking: Reported on 10/04/2022), Disp: 180 tablet, Rfl: 3   rosuvastatin (CRESTOR) 20 MG tablet, Take 1 tablet by mouth daily on Monday-Saturday, skip Sundays, Disp: 75 tablet, Rfl: 1   RYALTRIS 665-25 MCG/ACT SUSP, Place 2 puffs into the nose daily as needed. (Patient not taking:  Reported on 10/04/2022), Disp: 29 g, Rfl: 5   Allergies  Allergen Reactions   Amoxicillin Itching   Ceftin [Cefuroxime] Itching   Codeine Nausea Only   Sulfa Antibiotics Nausea And Vomiting     Review of Systems  Constitutional: Negative.   Respiratory: Negative.    Cardiovascular: Negative.   Gastrointestinal: Negative.   Skin: Negative.   Neurological: Negative.   Psychiatric/Behavioral: Negative.       Today's Vitals   04/06/23 1439  BP: 124/82  Pulse: 97  Temp: 97.8 F (36.6 C)  SpO2: 98%  Weight: 184 lb 9.6 oz (83.7 kg)  Height: 5\' 2"  (1.575 m)   Body mass index is 33.76 kg/m.  Wt Readings from Last 3 Encounters:   04/06/23 184 lb 9.6 oz (83.7 kg)  04/04/23 181 lb (82.1 kg)  02/23/23 181 lb (82.1 kg)     Objective:  Physical Exam Vitals and nursing note reviewed.  Constitutional:      Appearance: Normal appearance. She is obese.  HENT:     Head: Normocephalic and atraumatic.  Eyes:     Extraocular Movements: Extraocular movements intact.  Cardiovascular:     Rate and Rhythm: Normal rate and regular rhythm.     Heart sounds: Normal heart sounds.  Pulmonary:     Effort: Pulmonary effort is normal.     Breath sounds: Normal breath sounds.  Musculoskeletal:     Cervical back: Normal range of motion.  Skin:    General: Skin is warm.  Neurological:     General: No focal deficit present.     Mental Status: She is alert.  Psychiatric:        Mood and Affect: Mood normal.        Behavior: Behavior normal.         Assessment And Plan:  Primary hypothyroidism Assessment & Plan: Chronic, she is currently taking Synthroid daily M-Sat and 1/2 tab on Sundays.  I will check thyroid panel and adjust meds as needed.    Orders: -     TSH + free T4  Coronary artery disease due to lipid rich plaque Assessment & Plan: Chronic, LDL goal is less than 70 .  Most recent Cardiology note reviewed. She will continue with rosuvastatin 20mg  M-Saturday. Encouraged to follow heart healthy lifestyle. Per patient report, advised by Cardiology to not take ASA 81mg  daily.    Atherosclerosis of aorta (HCC) Assessment & Plan: Chronic, LDL goal less than 70. Encouraged to follow heart healthy lifestyle.    Other abnormal glucose Assessment & Plan: Her a1c has been elevated in the past. I will recheck this today. She is encouraged to limit her intake of sweetened beverages, including diet drinks.     Orders: -     Hemoglobin A1c  Class 1 obesity due to excess calories with serious comorbidity and body mass index (BMI) of 33.0 to 33.9 in adult Assessment & Plan: She is encouraged to strive for BMI  less than 30 to decrease cardiac risk. Advised to aim for at least 150 minutes of exercise per week.    Immunization declined  Drug therapy -     CMP14+EGFR  Other orders -     Rosuvastatin Calcium; Take 1 tablet by mouth daily on Monday-Saturday, skip Sundays  Dispense: 75 tablet; Refill: 1  She is encouraged to strive for BMI less than 30 to decrease cardiac risk. Advised to aim for at least 150 minutes of exercise per week.    Return  if symptoms worsen or fail to improve.  Patient was given opportunity to ask questions. Patient verbalized understanding of the plan and was able to repeat key elements of the plan. All questions were answered to their satisfaction.    I, Anita Aliment, MD, have reviewed all documentation for this visit. The documentation on 04/06/23 for the exam, diagnosis, procedures, and orders are all accurate and complete.   IF YOU HAVE BEEN REFERRED TO A SPECIALIST, IT MAY TAKE 1-2 WEEKS TO SCHEDULE/PROCESS THE REFERRAL. IF YOU HAVE NOT HEARD FROM US/SPECIALIST IN TWO WEEKS, PLEASE GIVE [place] A CALL AT [phone].   THE PATIENT IS ENCOURAGED TO PRACTICE SOCIAL DISTANCING DUE TO THE COVID-19 PANDEMIC.

## 2023-04-07 ENCOUNTER — Other Ambulatory Visit: Payer: Self-pay | Admitting: Internal Medicine

## 2023-04-07 DIAGNOSIS — E039 Hypothyroidism, unspecified: Secondary | ICD-10-CM

## 2023-04-07 LAB — TSH+FREE T4
Free T4: 1.54 ng/dL (ref 0.82–1.77)
TSH: 0.178 u[IU]/mL — ABNORMAL LOW (ref 0.450–4.500)

## 2023-04-07 LAB — CMP14+EGFR
ALT: 14 [IU]/L (ref 0–32)
AST: 21 [IU]/L (ref 0–40)
Albumin: 4.1 g/dL (ref 3.9–4.9)
Alkaline Phosphatase: 100 [IU]/L (ref 44–121)
BUN/Creatinine Ratio: 16 (ref 12–28)
BUN: 15 mg/dL (ref 8–27)
Bilirubin Total: 0.4 mg/dL (ref 0.0–1.2)
CO2: 25 mmol/L (ref 20–29)
Calcium: 9.3 mg/dL (ref 8.7–10.3)
Chloride: 106 mmol/L (ref 96–106)
Creatinine, Ser: 0.92 mg/dL (ref 0.57–1.00)
Globulin, Total: 2.6 g/dL (ref 1.5–4.5)
Glucose: 80 mg/dL (ref 70–99)
Potassium: 3.9 mmol/L (ref 3.5–5.2)
Sodium: 144 mmol/L (ref 134–144)
Total Protein: 6.7 g/dL (ref 6.0–8.5)
eGFR: 69 mL/min/{1.73_m2} (ref >59)

## 2023-04-07 LAB — HEMOGLOBIN A1C
Est. average glucose Bld gHb Est-mCnc: 131 mg/dL
Hgb A1c MFr Bld: 6.2 % — ABNORMAL HIGH (ref 4.8–5.6)

## 2023-04-10 ENCOUNTER — Encounter: Payer: Self-pay | Admitting: Internal Medicine

## 2023-04-13 ENCOUNTER — Other Ambulatory Visit: Payer: Self-pay

## 2023-04-18 ENCOUNTER — Other Ambulatory Visit (HOSPITAL_COMMUNITY): Payer: Self-pay

## 2023-04-18 ENCOUNTER — Other Ambulatory Visit: Payer: Self-pay

## 2023-04-19 ENCOUNTER — Other Ambulatory Visit: Payer: Self-pay

## 2023-04-19 ENCOUNTER — Ambulatory Visit (INDEPENDENT_AMBULATORY_CARE_PROVIDER_SITE_OTHER): Payer: PPO

## 2023-04-19 VITALS — BP 124/80 | HR 74 | Temp 98.5°F | Ht 62.0 in | Wt 184.0 lb

## 2023-04-19 DIAGNOSIS — Z23 Encounter for immunization: Secondary | ICD-10-CM

## 2023-04-19 MED ORDER — BETAMETHASONE DIPROPIONATE 0.05 % EX CREA
1.0000 | TOPICAL_CREAM | Freq: Two times a day (BID) | CUTANEOUS | 2 refills | Status: AC
  Filled 2023-04-19 – 2023-04-21 (×2): qty 30, 30d supply, fill #0
  Filled 2023-08-10: qty 30, 30d supply, fill #1
  Filled 2024-01-26: qty 30, 30d supply, fill #2

## 2023-04-19 NOTE — Progress Notes (Signed)
Presented today for COVID vaccine. She was given [employer] COVID booster in L arm. She waited for 15 minutes, she did not experience any ill effects.

## 2023-04-21 ENCOUNTER — Other Ambulatory Visit (HOSPITAL_COMMUNITY): Payer: Self-pay

## 2023-04-21 ENCOUNTER — Other Ambulatory Visit: Payer: Self-pay

## 2023-05-24 ENCOUNTER — Other Ambulatory Visit: Payer: Self-pay | Admitting: Internal Medicine

## 2023-05-24 MED ORDER — LEVOTHYROXINE SODIUM 88 MCG PO TABS
88.0000 ug | ORAL_TABLET | Freq: Every day | ORAL | 1 refills | Status: DC
  Filled 2023-05-24: qty 90, 97d supply, fill #0
  Filled 2023-05-25: qty 85, 90d supply, fill #0

## 2023-05-25 ENCOUNTER — Other Ambulatory Visit: Payer: Self-pay

## 2023-05-25 ENCOUNTER — Other Ambulatory Visit (HOSPITAL_COMMUNITY): Payer: Self-pay

## 2023-05-25 MED ORDER — LEVOTHYROXINE SODIUM 88 MCG PO TABS
88.0000 ug | ORAL_TABLET | Freq: Every day | ORAL | 1 refills | Status: DC
  Filled 2023-05-25: qty 28, 28d supply, fill #0
  Filled 2023-06-19: qty 28, 28d supply, fill #1
  Filled 2023-07-13: qty 28, 28d supply, fill #2
  Filled 2023-08-13: qty 28, 28d supply, fill #3
  Filled 2023-09-30: qty 28, 28d supply, fill #4

## 2023-05-26 ENCOUNTER — Other Ambulatory Visit: Payer: Self-pay

## 2023-06-20 ENCOUNTER — Other Ambulatory Visit: Payer: Self-pay

## 2023-06-22 ENCOUNTER — Other Ambulatory Visit: Payer: Self-pay

## 2023-07-12 ENCOUNTER — Other Ambulatory Visit: Payer: Medicare Other

## 2023-07-12 DIAGNOSIS — E039 Hypothyroidism, unspecified: Secondary | ICD-10-CM

## 2023-07-13 LAB — TSH: TSH: 4.44 u[IU]/mL (ref 0.450–4.500)

## 2023-07-15 ENCOUNTER — Encounter: Payer: Self-pay | Admitting: Internal Medicine

## 2023-08-10 ENCOUNTER — Other Ambulatory Visit: Payer: Self-pay | Admitting: Obstetrics and Gynecology

## 2023-08-10 ENCOUNTER — Telehealth: Payer: Self-pay

## 2023-08-10 DIAGNOSIS — Z1231 Encounter for screening mammogram for malignant neoplasm of breast: Secondary | ICD-10-CM

## 2023-08-10 NOTE — Telephone Encounter (Signed)
 Patient called and insurance needed to be updated for [employer] billing.   Called 3:15pm 08/10/23 and updated insurance

## 2023-09-07 ENCOUNTER — Ambulatory Visit
Admission: RE | Admit: 2023-09-07 | Discharge: 2023-09-07 | Disposition: A | Discharge status: Home / Self Care | Source: Ambulatory Visit | Attending: Obstetrics and Gynecology | Admitting: Obstetrics and Gynecology

## 2023-09-07 DIAGNOSIS — Z1231 Encounter for screening mammogram for malignant neoplasm of breast: Secondary | ICD-10-CM

## 2023-09-14 ENCOUNTER — Other Ambulatory Visit: Payer: Self-pay

## 2023-09-29 ENCOUNTER — Encounter: Payer: Self-pay | Admitting: Internal Medicine

## 2023-09-29 ENCOUNTER — Ambulatory Visit (INDEPENDENT_AMBULATORY_CARE_PROVIDER_SITE_OTHER): Admitting: Internal Medicine

## 2023-09-29 VITALS — BP 130/70 | Temp 97.9°F | Ht 62.0 in | Wt 186.6 lb

## 2023-09-29 DIAGNOSIS — R051 Acute cough: Secondary | ICD-10-CM

## 2023-09-29 DIAGNOSIS — R059 Cough, unspecified: Secondary | ICD-10-CM

## 2023-09-29 DIAGNOSIS — H60549 Acute eczematoid otitis externa, unspecified ear: Secondary | ICD-10-CM | POA: Diagnosis not present

## 2023-09-29 NOTE — Progress Notes (Signed)
 I,Victoria T Basil Lim, CMA,acting as a Neurosurgeon for Smiley Dung, MD.,have documented all relevant documentation on the behalf of Smiley Dung, MD,as directed by  Smiley Dung, MD while in the presence of Smiley Dung, MD.  Subjective:  Patient ID: Anita Duncan , female    DOB: 1955/08/30 , 68 y.o.   MRN: [id number]  Chief Complaint  Patient presents with   Cough    Pt complaining of allergies and a bad productive cough, her ears start itching, Pt is using ear drops and ear cream. Pt was prescribed a ciprofloxacin  and dexamethasone . Pt doesn't have any complain of any headaches, chest pain, and sob.     HPI Discussed the use of AI scribe software for clinical note transcription with the patient, who gave verbal consent to proceed.  History of Present Illness Anita Duncan is a 68 year old female who presents with a cough and throat irritation.  She has been experiencing a cough and throat irritation that began earlier this week. The throat sensation is described as 'irritated' rather than scratchy, and she produces clear phlegm when coughing. The symptoms worsen when she is outdoors.  She has had difficulty sleeping due to ear discomfort for the past three weeks. She was previously diagnosed with eczema in her ears and has been using prescribed ear drops and cream, which she applies with her finger. These treatments have provided some relief.  She has been using Flonase  recently and takes Xyzal  at night as needed, though not consistently every night. She prefers to minimize medication use.  She reports a negative COVID test taken yesterday and mentions attending a funeral without wearing a mask, although she sat apart from others.  She works for the [employer] and has access to COVID tests through her employment.  No tenderness or signs of infection.    Past Medical History:  Diagnosis Date   Allergy    Anemia During pregnancy 1984.  Not present anymore   Cataract  Microscopic one.  Eye exams annually to make sure   Heart murmur Was told not severe.  We all have one some worse t   Hypothyroidism    Neuromuscular disorder (HCC) 2009   Injury on the job.  Currently being treated, ortho doctor.,   Seasonal allergies      Family History  Problem Relation Age of Onset   Allergic rhinitis Mother    Hearing loss Mother    Heart disease Mother    Hypertension Mother    Heart disease Father    Breast cancer Cousin 93   Asthma Son    Cancer Maternal Grandfather    Miscarriages / Stillbirths Maternal Grandmother    Stroke Maternal Grandmother    Arthritis Maternal Aunt    Stroke Maternal Aunt    Vision loss Maternal Aunt    Arthritis Paternal Aunt    Asthma Son    Diabetes Maternal Aunt    Heart disease Maternal Aunt    Hypertension Maternal Aunt      Current Outpatient Medications:    benzonatate  (TESSALON ) 100 MG capsule, Take 1-2 tablets 3 times a day as needed for cough, Disp: 30 capsule, Rfl: 0   betamethasone  dipropionate 0.05 % cream, Apply 1 Application topically 2 (two) times daily., Disp: 30 g, Rfl: 2   chlorpheniramine-HYDROcodone  (TUSSIONEX) 10-8 MG/5ML, Take 5 mLs by mouth every 12 (twelve) hours as needed for cough., Disp: 115 mL, Rfl: 0   fluticasone  (FLONASE ) 50 MCG/ACT nasal  spray, Place 1 spray into both nostrils daily., Disp: 48 g, Rfl: 1   levocetirizine (XYZAL ) 5 MG tablet, Take 1 tablet (5 mg total) by mouth every evening., Disp: 90 tablet, Rfl: 1   levothyroxine  (SYNTHROID ) 88 MCG tablet, Take 1 tablet (88 mcg total) by mouth daily on Monday through Saturdays and 1/2 tablet on sundays, Disp: 90 tablet, Rfl: 1   rosuvastatin  (CRESTOR ) 20 MG tablet, Take 1 tablet by mouth daily on Monday-Saturday, skip Sundays., Disp: 75 tablet, Rfl: 1   EPINEPHrine  0.3 mg/0.3 mL IJ SOAJ injection, Inject 0.3 mg into the muscle as needed for anaphylaxis. (Patient not taking: Reported on 09/29/2023), Disp: 1 each, Rfl: 1   nabumetone  (RELAFEN )  750 MG tablet, TAKE 1 TABLET BY MOUTH TWICE  DAILY WITH FOOD AS NEEDED FOR  MILD OR MODERATE PAIN (Patient not taking: Reported on 09/29/2023), Disp: 180 tablet, Rfl: 3   RYALTRIS  665-25 MCG/ACT SUSP, Place 2 puffs into the nose daily as needed. (Patient not taking: Reported on 09/29/2023), Disp: 29 g, Rfl: 5   Allergies  Allergen Reactions   Amoxicillin  Itching   Ceftin  [Cefuroxime ] Itching   Codeine Nausea Only   Sulfa Antibiotics Nausea And Vomiting     Review of Systems  Constitutional: Negative.   HENT:  Positive for ear pain and sore throat.   Respiratory: Negative.    Cardiovascular: Negative.   Gastrointestinal: Negative.   Neurological: Negative.   Psychiatric/Behavioral: Negative.       Today's Vitals   09/29/23 1618  BP: 130/70  Temp: 97.9 F (36.6 C)  SpO2: 98%  Weight: 186 lb 9.6 oz (84.6 kg)  Height: 5\' 2"  (1.575 m)   Body mass index is 34.13 kg/m.  Wt Readings from Last 3 Encounters:  09/29/23 186 lb 9.6 oz (84.6 kg)  04/19/23 184 lb (83.5 kg)  04/06/23 184 lb 9.6 oz (83.7 kg)     Objective:  Physical Exam Vitals and nursing note reviewed.  Constitutional:      Appearance: Normal appearance.  HENT:     Head: Normocephalic and atraumatic.     Right Ear: Tympanic membrane, ear canal and external ear normal. There is no impacted cerumen.     Left Ear: Tympanic membrane, ear canal and external ear normal. There is no impacted cerumen.     Ears:     Comments: Scaly skin in canal Eyes:     Extraocular Movements: Extraocular movements intact.  Cardiovascular:     Rate and Rhythm: Normal rate and regular rhythm.     Heart sounds: Normal heart sounds.  Pulmonary:     Effort: Pulmonary effort is normal.     Breath sounds: Normal breath sounds.  Skin:    General: Skin is warm.  Neurological:     General: No focal deficit present.     Mental Status: She is alert.  Psychiatric:        Mood and Affect: Mood normal.        Behavior: Behavior normal.        Assessment And Plan:  Acute cough Assessment & Plan: Acute cough with clear phlegm likely due to allergies, no infection signs, negative COVID test. - Advise Xyzal  (antihistamine) nightly during allergy season. - Continue Flonase  as directed.     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 Darlin Ehrlich. - Continue prescribed ear drops and cream as directed by Dr. Darlin Ehrlich.    Return  if symptoms worsen or fail to improve.  Patient was given opportunity to ask questions. Patient verbalized understanding of the plan and was able to repeat key elements of the plan. All questions were answered to their satisfaction.    I, Smiley Dung, MD, have reviewed all documentation for this visit. The documentation on 09/29/23 for the exam, diagnosis, procedures, and orders are all accurate and complete.   IF YOU HAVE BEEN REFERRED TO A SPECIALIST, IT MAY TAKE 1-2 WEEKS TO SCHEDULE/PROCESS THE REFERRAL. IF YOU HAVE NOT HEARD FROM US /SPECIALIST IN TWO WEEKS, PLEASE GIVE US  A CALL AT [phone].   THE PATIENT IS ENCOURAGED TO PRACTICE SOCIAL DISTANCING DUE TO THE COVID-19 PANDEMIC.

## 2023-09-29 NOTE — Patient Instructions (Addendum)
 Careg[facility]   Cough, Adult A cough helps to clear your throat and lungs. It may be a sign of an illness or another condition. A short-term (acute) cough may last 2-3 weeks. A long-term (chronic) cough may last 8 or more weeks. Many things can cause a cough. They include: Illnesses such as: An infection in your throat or lungs. Asthma or other heart or lung problems. Gastroesophageal reflux. This is when acid comes back up from your stomach. Breathing in things that bother (irritate) your lungs. Allergies. Postnasal drip. This is when mucus runs down the back of your throat. Smoking. Some medicines. Follow these instructions at home: Medicines Take over-the-counter and prescription medicines only as told by your doctor. Talk with your doctor before you take cough medicine (cough suppressants). Eating and drinking Do not drink alcohol. Do not drink caffeine. Drink enough fluid to keep your pee (urine) pale yellow. Lifestyle Stay away from cigarette smoke. Do not smoke or use any products that contain nicotine or tobacco. If you need help quitting, ask your doctor. Stay away from things that make you cough. These may include perfume, candles, cleaning products, or campfire smoke. General instructions  Watch for any changes to your cough. Tell your doctor about them. Always cover your mouth when you cough. If the air is dry in your home, use a cool mist vaporizer or humidifier. If your cough is worse at night, try using extra pillows to raise your head up higher while you sleep. Rest as needed. Contact a doctor if: You have new symptoms. Your symptoms get worse. You cough up pus. You have a fever that does not go away. Your cough does not get better after 2-3 weeks. Cough medicine does not help, and you are not sleeping well. You have pain that gets worse or is not helped with medicine. You are losing weight and do not know why. You have night sweats. Get help right  away if: You cough up blood. You have trouble breathing. Your heart is beating very fast. These symptoms may be an emergency. Get help right away. Call 911. Do not wait to see if the symptoms will go away. Do not drive yourself to the hospital. This information is not intended to replace advice given to you by your health care provider. Make sure you discuss any questions you have with your health care provider. Document Revised: 01/01/2022 Document Reviewed: 01/01/2022 Elsevier Patient Education  2024 [employer].

## 2023-10-03 ENCOUNTER — Other Ambulatory Visit: Payer: Self-pay

## 2023-10-03 ENCOUNTER — Ambulatory Visit: Admitting: Internal Medicine

## 2023-10-08 DIAGNOSIS — R051 Acute cough: Secondary | ICD-10-CM | POA: Insufficient documentation

## 2023-10-08 DIAGNOSIS — H60549 Acute eczematoid otitis externa, unspecified ear: Secondary | ICD-10-CM | POA: Insufficient documentation

## 2023-10-08 NOTE — Assessment & Plan Note (Signed)
 Acute cough with clear phlegm likely due to allergies, no infection signs, negative COVID test. - Advise Xyzal  (antihistamine) nightly during allergy season. - Continue Flonase  as directed.

## 2023-10-08 NOTE — Assessment & Plan Note (Signed)
 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 Darlin Ehrlich. - Continue prescribed ear drops and cream as directed by Dr. Darlin Ehrlich.

## 2023-10-11 ENCOUNTER — Encounter: Payer: Self-pay | Admitting: Internal Medicine

## 2023-10-11 ENCOUNTER — Ambulatory Visit: Payer: PPO | Admitting: Internal Medicine

## 2023-10-11 ENCOUNTER — Other Ambulatory Visit: Payer: Self-pay

## 2023-10-11 VITALS — BP 130/82 | HR 89 | Temp 97.8°F | Ht 62.0 in | Wt 187.8 lb

## 2023-10-11 DIAGNOSIS — E039 Hypothyroidism, unspecified: Secondary | ICD-10-CM

## 2023-10-11 DIAGNOSIS — Z6834 Body mass index (BMI) 34.0-34.9, adult: Secondary | ICD-10-CM

## 2023-10-11 DIAGNOSIS — Z0000 Encounter for general adult medical examination without abnormal findings: Secondary | ICD-10-CM | POA: Diagnosis not present

## 2023-10-11 DIAGNOSIS — I2510 Atherosclerotic heart disease of native coronary artery without angina pectoris: Secondary | ICD-10-CM

## 2023-10-11 DIAGNOSIS — I700 Atherosclerosis of aorta: Secondary | ICD-10-CM | POA: Diagnosis not present

## 2023-10-11 DIAGNOSIS — E6609 Other obesity due to excess calories: Secondary | ICD-10-CM

## 2023-10-11 DIAGNOSIS — E66811 Obesity, class 1: Secondary | ICD-10-CM

## 2023-10-11 DIAGNOSIS — I2583 Coronary atherosclerosis due to lipid rich plaque: Secondary | ICD-10-CM

## 2023-10-11 DIAGNOSIS — R7309 Other abnormal glucose: Secondary | ICD-10-CM

## 2023-10-11 HISTORY — DX: Encounter for general adult medical examination without abnormal findings: Z00.00

## 2023-10-11 MED ORDER — LEVOTHYROXINE SODIUM 88 MCG PO TABS: 88.0000 ug | ORAL_TABLET | Freq: Every day | ORAL | Status: DC

## 2023-10-11 MED ORDER — LEVOCETIRIZINE DIHYDROCHLORIDE 5 MG PO TABS
5.0000 mg | ORAL_TABLET | Freq: Every evening | ORAL | 2 refills | Status: DC
  Filled 2023-10-11: qty 90, 90d supply, fill #0

## 2023-10-11 NOTE — Assessment & Plan Note (Signed)

## 2023-10-11 NOTE — Assessment & Plan Note (Signed)
 Chronic, LDL goal is less than 70 .  Most recent Cardiology note reviewed. She will continue with rosuvastatin 20mg  M-Saturday. Encouraged to follow heart healthy lifestyle. Per patient report, advised by Cardiology to not take ASA 81mg  daily.

## 2023-10-11 NOTE — Assessment & Plan Note (Signed)
 Chronic, LDL goal less than 70. Encouraged to follow heart healthy lifestyle.

## 2023-10-11 NOTE — Patient Instructions (Signed)

## 2023-10-11 NOTE — Progress Notes (Signed)
 I,Victoria T Basil Lim, CMA,acting as a Neurosurgeon for Smiley Dung, MD.,have documented all relevant documentation on the behalf of Smiley Dung, MD,as directed by  Smiley Dung, MD while in the presence of Smiley Dung, MD.  Subjective:    Patient ID: Anita Duncan , female    DOB: 1955-08-02 , 68 y.o.   MRN: [id number]  Chief Complaint  Patient presents with   Annual Exam    Patient presents today for annual exam. She reports compliance with medications. Denies headache, chest pain & sob.   Hypothyroidism    HPI Discussed the use of AI scribe software for clinical note transcription with the patient, who gave verbal consent to proceed.  History of Present Illness Anita Duncan is a 68 year old female who presents for an annual physical exam.  She feels better than during her last visit. She has a cough that has improved significantly since she started taking her medication at night, resulting in only occasional light coughing without the previous severity of symptoms such as phlegm production.  She has a history of ear itching, which has been alleviated by using skin drops and cream. She applies the drops into her ears and the cream externally, which has improved her symptoms.  She recalls having two breast biopsies in 1997 due to a fibroid adenoma. The area remains sensitive since the biopsies. She performs regular breast exams.   ' Thyroid  Problem Presents for follow-up visit. Patient reports no anxiety, cold intolerance, fatigue, palpitations or visual change. The symptoms have been stable.     Past Medical History:  Diagnosis Date   Allergy    Anemia During pregnancy 1984.  Not present anymore   Cataract Microscopic one.  Eye exams annually to make sure   Encounter for annual physical exam 10/11/2023   Heart murmur Was told not severe.  We all have one some worse t   Hypothyroidism    Neuromuscular disorder (HCC) 2009   Injury on the job.  Currently being  treated, ortho doctor.,   Seasonal allergies      Family History  Problem Relation Age of Onset   Allergic rhinitis Mother    Hearing loss Mother    Heart disease Mother    Hypertension Mother    Heart disease Father    Breast cancer Cousin 67   Asthma Son    Cancer Maternal Grandfather    Miscarriages / Stillbirths Maternal Grandmother    Stroke Maternal Grandmother    Arthritis Maternal Aunt    Stroke Maternal Aunt    Vision loss Maternal Aunt    Arthritis Paternal Aunt    Asthma Son    Diabetes Maternal Aunt    Heart disease Maternal Aunt    Hypertension Maternal Aunt      Current Outpatient Medications:    benzonatate  (TESSALON ) 100 MG capsule, Take 1-2 tablets 3 times a day as needed for cough, Disp: 30 capsule, Rfl: 0   betamethasone  dipropionate 0.05 % cream, Apply 1 Application topically 2 (two) times daily., Disp: 30 g, Rfl: 2   fluticasone  (FLONASE ) 50 MCG/ACT nasal spray, Place 1 spray into both nostrils daily., Disp: 48 g, Rfl: 1   levothyroxine  (SYNTHROID ) 75 MCG tablet, Take one tablet by mouth on Saturday and Sundays., Disp: 30 tablet, Rfl: 2   rosuvastatin  (CRESTOR ) 20 MG tablet, Take 1 tablet by mouth daily on Monday-Saturday, skip Sundays., Disp: 75 tablet, Rfl: 1   EPINEPHrine  0.3 mg/0.3 mL IJ SOAJ  injection, Inject 0.3 mg into the muscle as needed for anaphylaxis. (Patient not taking: Reported on 10/11/2023), Disp: 1 each, Rfl: 1   levocetirizine (XYZAL ) 5 MG tablet, Take 1 tablet (5 mg total) by mouth every evening., Disp: 90 tablet, Rfl: 2   levothyroxine  (SYNTHROID ) 88 MCG tablet, Take 1 tablet (88 mcg total) by mouth daily. Monday thru Friday only, Disp: , Rfl:    Allergies  Allergen Reactions   Amoxicillin  Itching   Ceftin  [Cefuroxime ] Itching   Codeine Nausea Only   Sulfa Antibiotics Nausea And Vomiting      The patient states she uses post menopausal status for birth control. No LMP recorded. Patient is postmenopausal.. Negative for  Dysmenorrhea. Negative for: breast discharge, breast lump(s), breast pain and breast self exam. Associated symptoms include abnormal vaginal bleeding. Pertinent negatives include abnormal bleeding (hematology), anxiety, decreased libido, depression, difficulty falling sleep, dyspareunia, history of infertility, nocturia, sexual dysfunction, sleep disturbances, urinary incontinence, urinary urgency, vaginal discharge and vaginal itching. Diet regular.The patient states her exercise level is    . The patient's tobacco use is:  Social History   Tobacco Use  Smoking Status Former   Current packs/day: 0.00   Average packs/day: 0.5 packs/day for 43.3 years (21.6 ttl pk-yrs)   Types: Cigarettes   Start date: 52   Quit date: 09/03/2019   Years since quitting: 4.1   Passive exposure: Never  Smokeless Tobacco Former  Tobacco Comments   Cutting back.  Prefer to do cold [place].  Smoked off & on.  Some yrs I quit 6 mos - 1 yr.   Smoked  . She has been exposed to passive smoke. The patient's alcohol use is:  Social History   Substance and Sexual Activity  Alcohol Use Not Currently   Alcohol/week: 1.0 - 2.0 standard drink of alcohol   Types: 1 - 2 Glasses of wine per week   Comment: A glass of wine special occasions.  1/2 glasses e/blue moon    Review of Systems  Constitutional: Negative.  Negative for fatigue.  HENT: Negative.    Eyes: Negative.   Respiratory: Negative.    Cardiovascular: Negative.  Negative for palpitations.  Gastrointestinal: Negative.   Endocrine: Negative.  Negative for cold intolerance.  Genitourinary: Negative.   Musculoskeletal: Negative.   Skin: Negative.   Allergic/Immunologic: Negative.   Neurological: Negative.   Hematological: Negative.   Psychiatric/Behavioral: Negative.  The patient is not nervous/anxious.      Today's Vitals   10/11/23 1405  BP: 130/82  Pulse: 89  Temp: 97.8 F (36.6 C)  SpO2: 98%  Weight: 187 lb 12.8 oz (85.2 kg)  Height: 5\' 2"   (1.575 m)   Body mass index is 34.35 kg/m.  Wt Readings from Last 3 Encounters:  10/11/23 187 lb 12.8 oz (85.2 kg)  09/29/23 186 lb 9.6 oz (84.6 kg)  04/19/23 184 lb (83.5 kg)     Objective:  Physical Exam Vitals and nursing note reviewed.  Constitutional:      Appearance: Normal appearance. She is obese.  HENT:     Head: Normocephalic and atraumatic.     Right Ear: Tympanic membrane, ear canal and external ear normal.     Left Ear: Tympanic membrane, ear canal and external ear normal.     Nose: Nose normal.     Mouth/Throat:     Mouth: Mucous membranes are moist.     Pharynx: Oropharynx is clear.  Eyes:     Extraocular Movements: Extraocular movements intact.  Conjunctiva/sclera: Conjunctivae normal.     Pupils: Pupils are equal, round, and reactive to light.  Cardiovascular:     Rate and Rhythm: Normal rate and regular rhythm.     Pulses: Normal pulses.     Heart sounds: Normal heart sounds.  Pulmonary:     Effort: Pulmonary effort is normal.     Breath sounds: Normal breath sounds.  Chest:  Breasts:    Tanner Score is 5.     Right: Tenderness present.     Left: Normal.     Comments: Healed scar right breast, inner lower quadrant Tenderness to palpation of right breast Abdominal:     General: Bowel sounds are normal.     Palpations: Abdomen is soft.  Genitourinary:    Comments: deferred Musculoskeletal:        General: Normal range of motion.     Cervical back: Normal range of motion and neck supple.  Lymphadenopathy:     Upper Body:     Right upper body: No supraclavicular or axillary adenopathy.     Left upper body: No supraclavicular or axillary adenopathy.  Skin:    General: Skin is warm and dry.  Neurological:     General: No focal deficit present.     Mental Status: She is alert and oriented to person, place, and time.  Psychiatric:        Mood and Affect: Mood normal.        Behavior: Behavior normal.       Assessment And Plan:      Encounter for annual physical exam Assessment & Plan: A full exam was performed.  Importance of monthly self breast exams was discussed with the patient.  She is advised to get 30-45 minutes of regular exercise, no less than four to five days per week. Both weight-bearing and aerobic exercises are recommended.  She is advised to follow a healthy diet with at least six fruits/veggies per day, decrease intake of red meat and other saturated fats and to increase fish intake to twice weekly.  Meats/fish should not be fried -- baked, boiled or broiled is preferable. It is also important to cut back on your sugar intake.  Be sure to read labels - try to avoid anything with added sugar, high fructose corn syrup or other sweeteners.  If you must use a sweetener, you can try stevia or monkfruit.  It is also important to avoid artificially sweetened foods/beverages and diet drinks. Lastly, wear SPF 50 sunscreen on exposed skin and when in direct sunlight for an extended period of time.  Be sure to avoid fast food restaurants and aim for at least 60 ounces of water daily.       Primary hypothyroidism Assessment & Plan: Chronic, she is currently taking Synthroid  88mcg daily M-Sat and 1/2 tab on Sundays.  I will check thyroid  panel and adjust meds as needed.    Orders: -     CBC -     TSH + free T4  Other abnormal glucose Assessment & Plan: Her a1c has been elevated in the past. I will recheck this today. She is encouraged to limit her intake of sweetened beverages, including diet drinks.     Orders: -     CMP14+EGFR -     Hemoglobin A1c  Coronary artery disease due to lipid rich plaque Assessment & Plan: Chronic, LDL goal is less than 70 .  Most recent Cardiology note reviewed. She will continue with rosuvastatin  20mg  M-Saturday. Encouraged  to follow heart healthy lifestyle. Per patient report, advised by Cardiology to not take ASA 81mg  daily.   Orders: -     Lipid panel  Atherosclerosis of  aorta (HCC) Assessment & Plan: Chronic, LDL goal less than 70. Encouraged to follow heart healthy lifestyle.   Orders: -     Lipid panel  Class 1 obesity due to excess calories with serious comorbidity and body mass index (BMI) of 34.0 to 34.9 in adult Assessment & Plan: She is encouraged to strive for BMI less than 30 to decrease cardiac risk. Advised to aim for at least 150 minutes of exercise per week.    Other orders -     Levocetirizine Dihydrochloride ; Take 1 tablet (5 mg total) by mouth every evening.  Dispense: 90 tablet; Refill: 2 -     Levothyroxine  Sodium; Take 1 tablet (88 mcg total) by mouth daily. Monday thru Friday only   Return in 6 months (on 04/12/2024), or thyroid /chol, for 1 year physical. Patient was given opportunity to ask questions. Patient verbalized understanding of the plan and was able to repeat key elements of the plan. All questions were answered to their satisfaction.   I, Smiley Dung, MD, have reviewed all documentation for this visit. The documentation on 10/11/23 for the exam, diagnosis, procedures, and orders are all accurate and complete.

## 2023-10-11 NOTE — Assessment & Plan Note (Signed)
Her a1c has been elevated in the past .I will recheck this today. She is encouraged to limit her intake of sweetened beverages, including diet drinks.

## 2023-10-11 NOTE — Assessment & Plan Note (Signed)
 Chronic, she is currently taking Synthroid daily M-Sat and 1/2 tab on Sundays.  I will check thyroid panel and adjust meds as needed.

## 2023-10-11 NOTE — Assessment & Plan Note (Signed)
 She is encouraged to strive for BMI less than 30 to decrease cardiac risk. Advised to aim for at least 150 minutes of exercise per week.

## 2023-10-12 ENCOUNTER — Ambulatory Visit: Payer: Self-pay | Admitting: Internal Medicine

## 2023-10-12 LAB — CMP14+EGFR
ALT: 13 IU/L (ref 0–32)
AST: 20 IU/L (ref 0–40)
Albumin: 4.4 g/dL (ref 3.9–4.9)
Alkaline Phosphatase: 98 IU/L (ref 44–121)
BUN/Creatinine Ratio: 13 (ref 12–28)
BUN: 11 mg/dL (ref 8–27)
Bilirubin Total: 0.4 mg/dL (ref 0.0–1.2)
CO2: 23 mmol/L (ref 20–29)
Calcium: 9.2 mg/dL (ref 8.7–10.3)
Chloride: 106 mmol/L (ref 96–106)
Creatinine, Ser: 0.86 mg/dL (ref 0.57–1.00)
Globulin, Total: 2.4 g/dL (ref 1.5–4.5)
Glucose: 82 mg/dL (ref 70–99)
Potassium: 4.2 mmol/L (ref 3.5–5.2)
Sodium: 142 mmol/L (ref 134–144)
Total Protein: 6.8 g/dL (ref 6.0–8.5)
eGFR: 74 mL/min/{1.73_m2} (ref >59)

## 2023-10-12 LAB — LIPID PANEL
Chol/HDL Ratio: 2.2 ratio (ref 0.0–4.4)
Cholesterol, Total: 172 mg/dL (ref 100–199)
HDL: 77 mg/dL (ref >39)
LDL Chol Calc (NIH): 84 mg/dL (ref 0–99)
Triglycerides: 56 mg/dL (ref 0–149)
VLDL Cholesterol Cal: 11 mg/dL (ref 5–40)

## 2023-10-12 LAB — TSH+FREE T4
Free T4: 0.95 ng/dL (ref 0.82–1.77)
TSH: 13.4 u[IU]/mL — ABNORMAL HIGH (ref 0.450–4.500)

## 2023-10-12 LAB — CBC
Hematocrit: 37.6 % (ref 34.0–46.6)
Hemoglobin: 12.2 g/dL (ref 11.1–15.9)
MCH: 29.2 pg (ref 26.6–33.0)
MCHC: 32.4 g/dL (ref 31.5–35.7)
MCV: 90 fL (ref 79–97)
Platelets: 257 10*3/uL (ref 150–450)
RBC: 4.18 x10E6/uL (ref 3.77–5.28)
RDW: 13.7 % (ref 11.7–15.4)
WBC: 6.6 10*3/uL (ref 3.4–10.8)

## 2023-10-12 LAB — HEMOGLOBIN A1C
Est. average glucose Bld gHb Est-mCnc: 126 mg/dL
Hgb A1c MFr Bld: 6 % — ABNORMAL HIGH (ref 4.8–5.6)

## 2023-10-13 ENCOUNTER — Other Ambulatory Visit: Payer: Self-pay

## 2023-10-13 MED ORDER — LEVOTHYROXINE SODIUM 75 MCG PO TABS
ORAL_TABLET | ORAL | 2 refills | Status: DC
  Filled 2023-10-13: qty 8, 28d supply, fill #0
  Filled 2023-11-06: qty 8, 28d supply, fill #1
  Filled 2023-11-23: qty 8, 28d supply, fill #2

## 2023-10-15 ENCOUNTER — Encounter: Payer: Self-pay | Admitting: Internal Medicine

## 2023-10-28 ENCOUNTER — Ambulatory Visit

## 2023-11-11 ENCOUNTER — Ambulatory Visit (INDEPENDENT_AMBULATORY_CARE_PROVIDER_SITE_OTHER)

## 2023-11-11 DIAGNOSIS — Z0000 Encounter for general adult medical examination without abnormal findings: Secondary | ICD-10-CM | POA: Diagnosis not present

## 2023-11-11 NOTE — Progress Notes (Signed)
 Subjective:   Anita Duncan is a 68 y.o. female who presents for Medicare Annual (Subsequent) preventive examination.  Visit Complete: In person  Patient Medicare AWV questionnaire was completed by the patient on 11/11/2023; I have confirmed that all information answered by patient is correct and no changes since this date.        Objective:    There were no vitals filed for this visit. There is no height or weight on file to calculate BMI.     10/21/2022    2:08 PM 09/22/2021    2:55 PM 10/25/2019   11:02 PM  Advanced Directives  Does Patient Have a Medical Advance Directive? No Yes No  Does patient want to make changes to medical advance directive?  Yes (MAU/Ambulatory/Procedural Areas - Information given)     Current Medications (verified) Outpatient Encounter Medications as of 11/11/2023  Medication Sig   benzonatate  (TESSALON ) 100 MG capsule Take 1-2 tablets 3 times a day as needed for cough   betamethasone  dipropionate 0.05 % cream Apply 1 Application topically 2 (two) times daily.   EPINEPHrine  0.3 mg/0.3 mL IJ SOAJ injection Inject 0.3 mg into the muscle as needed for anaphylaxis.   fluticasone  (FLONASE ) 50 MCG/ACT nasal spray Place 1 spray into both nostrils daily.   levocetirizine (XYZAL ) 5 MG tablet Take 1 tablet (5 mg total) by mouth every evening.   levothyroxine  (SYNTHROID ) 75 MCG tablet Take one tablet by mouth on Saturday and Sundays.   levothyroxine  (SYNTHROID ) 88 MCG tablet Take 1 tablet (88 mcg total) by mouth daily. Monday thru Friday only   rosuvastatin  (CRESTOR ) 20 MG tablet Take 1 tablet by mouth daily on Monday-Saturday, skip Sundays.   No facility-administered encounter medications on file as of 11/11/2023.    Allergies (verified) Amoxicillin , Ceftin  [cefuroxime ], Codeine, and Sulfa antibiotics   History: Past Medical History:  Diagnosis Date   Allergy    Anemia During pregnancy 1984.  Not present anymore   Cataract Microscopic one.  Eye exams  annually to make sure   Encounter for annual physical exam 10/11/2023   Heart murmur Was told not severe.  We all have one some worse t   Hypothyroidism    Neuromuscular disorder (HCC) 2009   Injury on the job.  Currently being treated, ortho doctor.,   Seasonal allergies    Past Surgical History:  Procedure Laterality Date   BREAST BIOPSY Right    BREAST BIOPSY Right    BREAST EXCISIONAL BIOPSY Right    CESAREAN SECTION     TONSILLECTOMY     Family History  Problem Relation Age of Onset   Allergic rhinitis Mother    Hearing loss Mother    Heart disease Mother    Hypertension Mother    Heart disease Father    Breast cancer Cousin 34   Asthma Son    Cancer Maternal Grandfather    Miscarriages / Stillbirths Maternal Grandmother    Stroke Maternal Grandmother    Arthritis Maternal Aunt    Stroke Maternal Aunt    Vision loss Maternal Aunt    Arthritis Paternal Aunt    Asthma Son    Diabetes Maternal Aunt    Heart disease Maternal Aunt    Hypertension Maternal Aunt    Heart disease Maternal Aunt    Heart disease Maternal Aunt    Heart disease Maternal Aunt    Heart disease Maternal Aunt    Social History   Socioeconomic History   Marital status: Single  Spouse name: Not on file   Number of children: Not on file   Years of education: Not on file   Highest education level: Bachelor's degree (e.g., BA, AB, BS)  Occupational History   Not on file  Tobacco Use   Smoking status: Former    Current packs/day: 0.00    Average packs/day: 0.5 packs/day for 43.3 years (21.6 ttl pk-yrs)    Types: Cigarettes    Start date: 40    Quit date: 09/03/2019    Years since quitting: 4.1    Passive exposure: Never   Smokeless tobacco: Former   Tobacco comments:    Former smoker since 09/03/19, 4 yrs quit cold [place] bc I wanted 2.  Vaping Use   Vaping status: Never Used  Substance and Sexual Activity   Alcohol use: Not Currently    Alcohol/week: 1.0 standard drink of alcohol     Types: 1 Glasses of wine per week    Comment: May have 1/2-1 glass special occasion(s) or not at all   Drug use: No   Sexual activity: Yes    Birth control/protection: Condom, Post-menopausal    Comment: .  Choice condoms  Other Topics Concern   Not on file  Social History Narrative   Not on file   Social Drivers of Health   Financial Resource Strain: Low Risk  (11/07/2023)   Overall Financial Resource Strain (CARDIA)    Difficulty of Paying Living Expenses: Not very hard  Food Insecurity: No Food Insecurity (11/07/2023)   Hunger Vital Sign    Worried About Running Out of Food in the Last Year: Never true    Ran Out of Food in the Last Year: Never true  Transportation Needs: No Transportation Needs (11/07/2023)   PRAPARE - Administrator, Civil Service (Medical): No    Lack of Transportation (Non-Medical): No  Physical Activity: Inactive (11/11/2023)   Exercise Vital Sign    Days of Exercise per Week: 0 days    Minutes of Exercise per Session: 0 min  Stress: No Stress Concern Present (11/07/2023)   [employer] of Occupational Health - Occupational Stress Questionnaire    Feeling of Stress: Only a little  Social Connections: Moderately Integrated (11/07/2023)   Social Connection and Isolation Panel    Frequency of Communication with Friends and Family: More than three times a week    Frequency of Social Gatherings with Friends and Family: Three times a week    Attends Religious Services: More than 4 times per year    Active Member of Clubs or Organizations: Yes    Attends Banker Meetings: More than 4 times per year    Marital Status: Never married    Tobacco Counseling Counseling given: Not Answered Tobacco comments: Former smoker since 09/03/19, 4 yrs quit cold [place] bc I wanted 2.   Clinical Intake:  Pre-visit preparation completed: Yes  Pain : No/denies pain     Nutritional Risks: None Diabetes: No  How often do you need to have  someone help you when you read instructions, pamphlets, or other written materials from your doctor or pharmacy?: 1 - Never What is the last grade level you completed in school?: some college  Interpreter Needed?: No  Information entered by :: Cecil Vandyke h   Activities of Daily Living    11/11/2023    9:04 AM  In your present state of health, do you have any difficulty performing the following activities:  Hearing? 0  Vision? 0  Difficulty concentrating or making decisions? 0  Walking or climbing stairs? 0  Dressing or bathing? 0  Doing errands, shopping? 0  Preparing Food and eating ? N  Using the Toilet? N  In the past six months, have you accidently leaked urine? N  Do you have problems with loss of bowel control? N  Managing your Medications? N  Managing your Finances? N  Housekeeping or managing your Housekeeping? N    Patient Care Team: Jarold Medici, MD as PCP - General (Internal Medicine) Jeffrie Oneil BROCKS, MD as PCP - Cardiology (Cardiology) Gorge Ade, MD as Consulting Physician (Obstetrics and Gynecology)  Indicate any recent Medical Services you may have received from other than [facility] providers in the past year (date may be approximate).     Assessment:   This is a routine wellness examination for Yarelis.  Hearing/Vision screen No results found.   Goals Addressed               This Visit's Progress     Consistent Exercise (pt-stated)        Wanting to keep up being active.       Depression Screen    11/11/2023    9:06 AM 10/11/2023    2:06 PM 09/29/2023    4:22 PM 10/21/2022    2:10 PM 10/04/2022    2:16 PM 09/22/2021    2:27 PM 09/22/2021    2:19 PM  PHQ 2/9 Scores  PHQ - 2 Score 3 0 0 0 0 0 0  PHQ- 9 Score 5 0 0  1      Fall Risk    11/11/2023    9:08 AM 10/11/2023    2:06 PM 09/29/2023    4:21 PM 10/17/2022    8:45 AM 10/04/2022    2:16 PM  Fall Risk   Falls in the past year? 0 0 0 0 0  Number falls in past yr: 0 0 0 0 0  Injury with  Fall?  0 0 0 0  Risk for fall due to : No Fall Risks No Fall Risks No Fall Risks Medication side effect No Fall Risks  Follow up Falls evaluation completed Falls evaluation completed Falls evaluation completed Falls prevention discussed;Education provided;Falls evaluation completed Falls evaluation completed    MEDICARE RISK AT HOME:     Cognitive Function:        11/11/2023    9:08 AM 10/21/2022    2:11 PM 09/22/2021    2:27 PM  6CIT Screen  What Year? 0 points 0 points 0 points  What month? 0 points 0 points 0 points  What time? 0 points 0 points 0 points  Count back from 20 0 points 0 points 0 points  Months in reverse 0 points 2 points 0 points  Repeat phrase 0 points 2 points 4 points  Total Score 0 points 4 points 4 points    Immunizations Immunization History  Administered Date(s) Administered   Influenza,inj,Quad PF,6+ Mos 02/20/2019   PFIZER(Purple Top)SARS-COV-2 Vaccination 07/21/2019, 08/11/2019, 03/15/2020   PNEUMOCOCCAL CONJUGATE-20 09/22/2021   Pfizer Covid-19 Vaccine Bivalent Booster 90yrs & up 04/22/2021   Pfizer(Comirnaty)Fall Seasonal Vaccine 12 years and older 04/19/2023   Tdap 09/10/2013   Zoster Recombinant(Shingrix ) 10/04/2022, 02/23/2023    TDAP status: Completed at today's visit  Flu Vaccine status: Up to date  Pneumococcal vaccine status: Up to date  Covid-19 vaccine status: Completed vaccines  Qualifies for Shingles Vaccine? Yes   Zostavax completed Yes   Shingrix  Completed?:  Yes  Screening Tests Health Maintenance  Topic Date Due   COVID-19 Vaccine (6 - Pfizer risk 2024-25 season) 10/18/2023   Lung Cancer Screening  10/25/2023   DTaP/Tdap/Td (2 - Td or Tdap) 10/10/2024 (Originally 09/11/2023)   INFLUENZA VACCINE  12/16/2023   Medicare Annual Wellness (AWV)  11/10/2024   Colonoscopy  08/20/2025   MAMMOGRAM  09/06/2025   Pneumococcal Vaccine: 50+ Years  Completed   DEXA SCAN  Completed   Hepatitis C Screening  Completed   Zoster  Vaccines- Shingrix   Completed   Hepatitis B Vaccines  Aged Out   HPV VACCINES  Aged Out   Meningococcal B Vaccine  Aged Out    Health Maintenance  Health Maintenance Due  Topic Date Due   COVID-19 Vaccine (6 - Pfizer risk 2024-25 season) 10/18/2023   Lung Cancer Screening  10/25/2023    Colorectal cancer screening: Type of screening: Colonoscopy. Completed 08/21/2015. Repeat every 10 years  Mammogram status: Completed 09/07/23. Repeat every year  Bone Density status: Completed 03/07/20. Results reflect: Bone density results: NORMAL. Repeat every 3 years.  Lung Cancer Screening: (Low Dose CT Chest recommended if Age 42-80 years, 20 pack-year currently smoking OR have quit w/in 15years.) does qualify.   Additional Screening:  Hepatitis C Screening: does qualify; Completed 08/27/2013   Dental Screening: Recommended annual dental exams for proper oral hygiene   Community Resource Referral / Chronic Care Management: CRR required this visit?  No   CCM required this visit?  No     Plan:     I have personally reviewed and noted the following in the patient's chart:   Medical and social history Use of alcohol, tobacco or illicit drugs  Current medications and supplements including opioid prescriptions. Patient is not currently taking opioid prescriptions. Functional ability and status Nutritional status Physical activity Advanced directives List of other physicians Hospitalizations, surgeries, and ER visits in previous 12 months Vitals Screenings to include cognitive, depression, and falls Referrals and appointments  In addition, I have reviewed and discussed with patient certain preventive protocols, quality metrics, and best practice recommendations. A written personalized care plan for preventive services as well as general preventive health recommendations were provided to patient.     Richerd ONEIDA Lemmings, CMA   11/11/2023   After Visit Summary: (MyChart) Due to  this being a telephonic visit, the after visit summary with patients personalized plan was offered to patient via MyChart   Nurse Notes: completed.

## 2023-11-20 ENCOUNTER — Encounter: Payer: Self-pay | Admitting: Internal Medicine

## 2023-11-23 ENCOUNTER — Other Ambulatory Visit: Payer: Self-pay

## 2023-11-23 MED ORDER — LEVOTHYROXINE SODIUM 75 MCG PO TABS
75.0000 ug | ORAL_TABLET | ORAL | 1 refills | Status: DC
  Filled 2023-11-23 – 2023-11-28 (×2): qty 24, 84d supply, fill #0
  Filled 2023-12-26: qty 24, 84d supply, fill #1

## 2023-11-23 MED ORDER — LEVOTHYROXINE SODIUM 88 MCG PO TABS: 88.0000 ug | ORAL_TABLET | Freq: Every day | ORAL | 1 refills | Status: DC

## 2023-11-28 ENCOUNTER — Other Ambulatory Visit: Payer: Self-pay

## 2023-11-29 ENCOUNTER — Other Ambulatory Visit: Payer: Self-pay

## 2023-11-30 ENCOUNTER — Other Ambulatory Visit: Payer: Self-pay

## 2023-11-30 ENCOUNTER — Other Ambulatory Visit

## 2023-11-30 DIAGNOSIS — E039 Hypothyroidism, unspecified: Secondary | ICD-10-CM

## 2023-12-01 LAB — TSH+FREE T4
Free T4: 1.75 ng/dL (ref 0.82–1.77)
TSH: 0.253 u[IU]/mL — ABNORMAL LOW (ref 0.450–4.500)

## 2023-12-04 ENCOUNTER — Ambulatory Visit: Payer: Self-pay | Admitting: Internal Medicine

## 2023-12-05 ENCOUNTER — Other Ambulatory Visit: Payer: Self-pay

## 2023-12-26 ENCOUNTER — Other Ambulatory Visit: Payer: Self-pay

## 2023-12-29 ENCOUNTER — Ambulatory Visit (INDEPENDENT_AMBULATORY_CARE_PROVIDER_SITE_OTHER): Admitting: Family Medicine

## 2023-12-29 ENCOUNTER — Encounter: Payer: Self-pay | Admitting: Family Medicine

## 2023-12-29 VITALS — BP 139/83 | HR 80 | Wt 182.0 lb

## 2023-12-29 DIAGNOSIS — Z01419 Encounter for gynecological examination (general) (routine) without abnormal findings: Secondary | ICD-10-CM | POA: Diagnosis not present

## 2023-12-29 NOTE — Progress Notes (Signed)
 Patient presents for Annual.  LMP: No LMP recorded. Patient is postmenopausal.  Last pap: Date: 2023-WNL Contraception: Post-menopausal Mammogram: Up to date: 2025 STD Screening: not indicated Flu Vaccine : N/A  CC: Wants to establish care

## 2023-12-29 NOTE — Progress Notes (Signed)
 Subjective:     Anita Duncan is a 68 y.o. female and is here for a comprehensive physical exam. The patient reports no problems.She is menopausal x many years. Has h/o polyps, which Dr. Gorge removed. Pap smear is up to date and no h/o abnormal paps. Recent mammogram WNL. Normal DEXA in 2021.  The following portions of the patient's history were reviewed and updated as appropriate: allergies, current medications, past family history, past medical history, past social history, past surgical history, and problem list.  Review of Systems Pertinent items are noted in HPI.   Objective:    BP 139/83   Pulse 80   Wt 182 lb (82.6 kg)   BMI 33.29 kg/m  General appearance: alert, cooperative, and appears stated age Head: Normocephalic, without obvious abnormality, atraumatic Neck: no adenopathy, supple, symmetrical, trachea midline, and thyroid not enlarged, symmetric, no tenderness/mass/nodules Lungs: clear to auscultation bilaterally Heart: regular rate and rhythm, S1, S2 normal, no murmur, click, rub or gallop Abdomen: soft, non-tender; bowel sounds normal; no masses,  no organomegaly Extremities: extremities normal, atraumatic, no cyanosis or edema Skin: Skin color, texture, turgor normal. No rashes or lesions Neurologic: Grossly normal    Assessment:    Healthy female exam.      Plan:  Encounter for gynecological examination without abnormal finding - Discussed need for pap if obtains a new sexual partner.    See After Visit Summary for Counseling Recommendations

## 2024-01-06 ENCOUNTER — Encounter: Payer: Self-pay | Admitting: Internal Medicine

## 2024-01-18 ENCOUNTER — Other Ambulatory Visit: Payer: Self-pay | Admitting: Internal Medicine

## 2024-01-18 ENCOUNTER — Other Ambulatory Visit: Payer: Self-pay

## 2024-01-18 MED FILL — Rosuvastatin Calcium Tab 20 MG: ORAL | 87 days supply | Qty: 75 | Fill #0 | Status: AC

## 2024-01-25 ENCOUNTER — Other Ambulatory Visit

## 2024-01-25 DIAGNOSIS — E039 Hypothyroidism, unspecified: Secondary | ICD-10-CM

## 2024-01-26 ENCOUNTER — Ambulatory Visit: Payer: Self-pay | Admitting: Internal Medicine

## 2024-01-26 ENCOUNTER — Other Ambulatory Visit: Payer: Self-pay

## 2024-01-26 LAB — TSH: TSH: 0.654 u[IU]/mL (ref 0.450–4.500)

## 2024-02-08 NOTE — Telephone Encounter (Signed)
 Pt phoned in needing a refill on ciprodex  drops for ear due to ear pain and drainage.I sent Rx refill, per Dr. Ethyl,  into [facility] [address]

## 2024-02-11 ENCOUNTER — Encounter: Payer: Self-pay | Admitting: Internal Medicine

## 2024-02-13 ENCOUNTER — Other Ambulatory Visit: Payer: Self-pay

## 2024-02-13 DIAGNOSIS — E039 Hypothyroidism, unspecified: Secondary | ICD-10-CM

## 2024-02-13 MED ORDER — LEVOTHYROXINE SODIUM 88 MCG PO TABS: 88.0000 ug | ORAL_TABLET | Freq: Every day | ORAL | Status: DC

## 2024-02-13 MED ORDER — LEVOTHYROXINE SODIUM 75 MCG PO TABS
75.0000 ug | ORAL_TABLET | ORAL | 1 refills | Status: DC
  Filled 2024-02-13: qty 24, 84d supply, fill #0

## 2024-02-13 MED ORDER — LEVOTHYROXINE SODIUM 88 MCG PO TABS
88.0000 ug | ORAL_TABLET | Freq: Every day | ORAL | 2 refills | Status: DC
Start: 2024-02-13 — End: 2024-02-20
  Filled 2024-02-13: qty 90, 90d supply, fill #0

## 2024-02-14 ENCOUNTER — Other Ambulatory Visit: Payer: Self-pay

## 2024-02-20 ENCOUNTER — Other Ambulatory Visit: Payer: Self-pay

## 2024-02-20 DIAGNOSIS — E039 Hypothyroidism, unspecified: Secondary | ICD-10-CM

## 2024-02-20 MED ORDER — LEVOTHYROXINE SODIUM 75 MCG PO TABS
ORAL_TABLET | ORAL | 1 refills | Status: AC
  Filled 2024-02-20: qty 90, fill #0
  Filled 2024-02-26: qty 90, 90d supply, fill #0
  Filled 2024-02-29: qty 78, 90d supply, fill #0
  Filled 2024-05-25: qty 78, 90d supply, fill #1

## 2024-02-20 MED ORDER — LEVOTHYROXINE SODIUM 88 MCG PO TABS
ORAL_TABLET | ORAL | 0 refills | Status: AC
  Filled 2024-02-20: qty 90, fill #0
  Filled 2024-05-25: qty 13, 90d supply, fill #0

## 2024-02-21 ENCOUNTER — Other Ambulatory Visit: Payer: Self-pay

## 2024-02-26 ENCOUNTER — Other Ambulatory Visit: Payer: Self-pay

## 2024-02-29 ENCOUNTER — Other Ambulatory Visit: Payer: Self-pay

## 2024-03-01 ENCOUNTER — Other Ambulatory Visit: Payer: Self-pay

## 2024-04-18 ENCOUNTER — Other Ambulatory Visit: Payer: Self-pay

## 2024-04-18 ENCOUNTER — Encounter: Payer: Self-pay | Admitting: Internal Medicine

## 2024-04-18 ENCOUNTER — Ambulatory Visit: Payer: Self-pay | Admitting: Internal Medicine

## 2024-04-18 VITALS — BP 136/70 | HR 86 | Temp 97.9°F | Ht 62.0 in | Wt 190.2 lb

## 2024-04-18 DIAGNOSIS — E6609 Other obesity due to excess calories: Secondary | ICD-10-CM

## 2024-04-18 DIAGNOSIS — E039 Hypothyroidism, unspecified: Secondary | ICD-10-CM | POA: Diagnosis not present

## 2024-04-18 DIAGNOSIS — E66811 Obesity, class 1: Secondary | ICD-10-CM

## 2024-04-18 DIAGNOSIS — R7309 Other abnormal glucose: Secondary | ICD-10-CM

## 2024-04-18 DIAGNOSIS — Z6834 Body mass index (BMI) 34.0-34.9, adult: Secondary | ICD-10-CM

## 2024-04-18 DIAGNOSIS — I2510 Atherosclerotic heart disease of native coronary artery without angina pectoris: Secondary | ICD-10-CM

## 2024-04-18 DIAGNOSIS — I2583 Coronary atherosclerosis due to lipid rich plaque: Secondary | ICD-10-CM | POA: Diagnosis not present

## 2024-04-18 DIAGNOSIS — J301 Allergic rhinitis due to pollen: Secondary | ICD-10-CM | POA: Insufficient documentation

## 2024-04-18 DIAGNOSIS — E7800 Pure hypercholesterolemia, unspecified: Secondary | ICD-10-CM | POA: Diagnosis not present

## 2024-04-18 DIAGNOSIS — F5102 Adjustment insomnia: Secondary | ICD-10-CM | POA: Insufficient documentation

## 2024-04-18 DIAGNOSIS — R030 Elevated blood-pressure reading, without diagnosis of hypertension: Secondary | ICD-10-CM

## 2024-04-18 MED ORDER — CETIRIZINE HCL 5 MG PO TABS
5.0000 mg | ORAL_TABLET | Freq: Every day | ORAL | 3 refills | Status: AC
  Filled 2024-04-18: qty 30, 30d supply, fill #0

## 2024-04-18 NOTE — Assessment & Plan Note (Signed)
 Chronic, she is currently taking Synthroid  75mcg daily M-Sat and 88mcg on Sundays.  I will check thyroid  panel and adjust meds as needed.

## 2024-04-18 NOTE — Assessment & Plan Note (Signed)
 Chronic, currently on rosuvastatin  20mg  daily.  - Follow heart healthy lifestyle - Continue with rosuvastatin  20mg  daily

## 2024-04-18 NOTE — Progress Notes (Signed)
 I,Victoria T Emmitt, CMA,acting as a neurosurgeon for Catheryn LOISE Slocumb, MD.,have documented all relevant documentation on the behalf of Catheryn LOISE Slocumb, MD,as directed by  Catheryn LOISE Slocumb, MD while in the presence of Catheryn LOISE Slocumb, MD.  Subjective:  Patient ID: Anita Duncan , female    DOB: [birth year] , 68 y.o.   MRN: [id number]  Chief Complaint  Patient presents with   Hypothyroidism    Patient being seen today for Thyroid  and Cholesterol check. She reports compliance with medications. Denies headache, chest pain & sob. She has no specific questions or concerns.    Hyperlipidemia    HPI Discussed the use of AI scribe software for clinical note transcription with the patient, who gave verbal consent to proceed.  History of Present Illness Anita Duncan is a 67 year old female who presents for a thyroid  check.  She is currently taking levothyroxine , with a dosage of 75 mcg from Monday to Saturday and 88 mcg on Sundays. She is also on rosuvastatin  20 mg daily.  She has a history of allergies, particularly to shellfish, which causes her lips to burn. She is dissatisfied with her current allergy medication, Xyzal , stating it is no longer effective. She has previously used Benadryl for allergic reactions and is considering switching to a low dose of "children's Zyrtec"  due to her sensitivity to medications.  She experiences difficulty sleeping, which she attributes to recent family bereavements and stress. She wakes up early in the morning and has not been sleeping well. She declines medication for sleep, attributing her sleep issues to grieving and stress.  She is involved in a caregiver support group at [employer], which she finds beneficial. Her mother's health issues, including memory problems, have been a source of stress for her.  Her social history includes a preference for a healthy diet, avoiding sweets and ice cream, and a desire to return to the gym, although she has not had time due to  caregiving responsibilities.   Hyperlipidemia This is a chronic problem. The current episode started more than 1 year ago. The problem is controlled. Exacerbating diseases include hypothyroidism. She has no history of chronic renal disease. Current antihyperlipidemic treatment includes statins, diet change and exercise. Risk factors for coronary artery disease include dyslipidemia and post-menopausal.     Past Medical History:  Diagnosis Date   Allergy    Anemia During pregnancy 1984.  Not present anymore   Cataract Microscopic one.  Eye exams annually to make sure   Encounter for annual physical exam 10/11/2023   Heart murmur Was told not severe.  We all have one some worse t   Hypothyroidism    Neuromuscular disorder (HCC) 2009   Injury on the job.  Currently being treated, ortho doctor.,   Seasonal allergies      Family History  Problem Relation Age of Onset   Allergic rhinitis Mother    Hearing loss Mother    Heart disease Mother    Hypertension Mother    Heart disease Father    Breast cancer Cousin 17   Asthma Son    Cancer Maternal Grandfather    Miscarriages / Stillbirths Maternal Grandmother    Stroke Maternal Grandmother    Arthritis Maternal Aunt    Stroke Maternal Aunt    Vision loss Maternal Aunt    Arthritis Paternal Aunt    Asthma Son    Diabetes Maternal Aunt    Heart disease Maternal Aunt    Hypertension Maternal Aunt  Heart disease Maternal Aunt    Heart disease Maternal Aunt    Heart disease Maternal Aunt    Heart disease Maternal Aunt      Current Outpatient Medications:    cetirizine (ZYRTEC) 5 MG tablet, Take 1 tablet (5 mg total) by mouth daily., Disp: 30 tablet, Rfl: 3   fluticasone  (FLONASE ) 50 MCG/ACT nasal spray, Place 1 spray into both nostrils daily., Disp: 48 g, Rfl: 1   levothyroxine  (SYNTHROID ) 75 MCG tablet, Take one tablet by mouth once daily on Monday through Saturday, Disp: 90 tablet, Rfl: 1   levothyroxine  (SYNTHROID ) 88 MCG  tablet, Take one tablet by mouth once daily on sundays, Disp: 90 tablet, Rfl: 0   rosuvastatin  (CRESTOR ) 20 MG tablet, Take 1 tablet by mouth daily on Monday-Saturday, skip Sundays., Disp: 75 tablet, Rfl: 1   betamethasone  dipropionate 0.05 % cream, Apply 1 Application topically 2 (two) times daily. (Patient not taking: Reported on 04/18/2024), Disp: 30 g, Rfl: 2   EPINEPHrine  0.3 mg/0.3 mL IJ SOAJ injection, Inject 0.3 mg into the muscle as needed for anaphylaxis. (Patient not taking: Reported on 04/18/2024), Disp: 1 each, Rfl: 1   Allergies  Allergen Reactions   Amoxicillin  Itching   Ceftin  [Cefuroxime ] Itching   Codeine Nausea Only   Sulfa Antibiotics Nausea And Vomiting     Review of Systems  Constitutional: Negative.   Respiratory: Negative.    Cardiovascular: Negative.   Neurological: Negative.   Psychiatric/Behavioral: Negative.       Today's Vitals   04/18/24 1158  BP: 136/70  Pulse: 86  Temp: 97.9 F (36.6 C)  SpO2: 97%  Weight: 190 lb 3.2 oz (86.3 kg)  Height: 5' 2 (1.575 m)   Body mass index is 34.79 kg/m.  Wt Readings from Last 3 Encounters:  04/18/24 190 lb 3.2 oz (86.3 kg)  12/29/23 182 lb (82.6 kg)  10/11/23 187 lb 12.8 oz (85.2 kg)     Objective:  Physical Exam Vitals and nursing note reviewed.  Constitutional:      Appearance: Normal appearance. She is obese.  HENT:     Head: Normocephalic and atraumatic.  Eyes:     Extraocular Movements: Extraocular movements intact.  Cardiovascular:     Rate and Rhythm: Normal rate and regular rhythm.     Heart sounds: Normal heart sounds.  Pulmonary:     Effort: Pulmonary effort is normal.     Breath sounds: Normal breath sounds.  Musculoskeletal:     Cervical back: Normal range of motion.  Skin:    General: Skin is warm.  Neurological:     General: No focal deficit present.     Mental Status: She is alert.  Psychiatric:        Mood and Affect: Mood normal.        Behavior: Behavior normal.          Assessment And Plan:  Primary hypothyroidism Assessment & Plan: Chronic, she is currently taking Synthroid  75mcg daily M-Sat and 88mcg on Sundays.  I will check thyroid  panel and adjust meds as needed.    Orders: -     TSH + free T4  Pure hypercholesterolemia Assessment & Plan: Chronic, currently on rosuvastatin  20mg  daily.  - Follow heart healthy lifestyle - Continue with rosuvastatin  20mg  daily  Orders: -     CMP14+EGFR  Coronary artery disease due to lipid rich plaque Assessment & Plan: Chronic, LDL goal is less than 70 .  Most recent Cardiology note reviewed. She will  continue with rosuvastatin  20mg  M-Saturday. Encouraged to follow heart healthy lifestyle. Per patient report, advised by Cardiology to not take ASA 81mg  daily.    Seasonal allergic rhinitis due to pollen Assessment & Plan: Xyzal  ineffective. Prefers to avoid Zyrtec and Allegra due to side effects. Considering low-dose Zyrtec. - Prescribed low-dose Zyrtec. - Sent prescription to [facility].   Other abnormal glucose Assessment & Plan: Previous labs reviewed, her A1c has been elevated in the past. I will check an A1c today. Reminded to avoid refined sugars including sugary drinks/foods and processed meats including bacon, sausages and deli meats.      Orders: -     CMP14+EGFR -     Hemoglobin A1c  Adjustment insomnia Assessment & Plan: Unfortunately, she has had two recent deaths in her family.  She does not wish to try any pharmaceutical meds at this time.  - Establish bedtime routine.  - Consider chamomile tea.   Elevated blood pressure reading Assessment & Plan: Elevated blood pressure likely due to stress and lack of sleep. Prefers no medication at this time. - Rechecked blood pressure. - Decrease sodium intake - Increase daily activity   Class 1 obesity due to excess calories with serious comorbidity and body mass index (BMI) of 34.0 to 34.9 in adult Assessment & Plan: She is encouraged to  strive for BMI less than 30 to decrease cardiac risk. Advised to aim for at least 150 minutes of exercise per week.    Other orders -     Cetirizine HCl; Take 1 tablet (5 mg total) by mouth daily.  Dispense: 30 tablet; Refill: 3   Return for 6 month thyroid  f/u.SABRA  Patient was given opportunity to ask questions. Patient verbalized understanding of the plan and was able to repeat key elements of the plan. All questions were answered to their satisfaction.   I, Catheryn LOISE Slocumb, MD, have reviewed all documentation for this visit. The documentation on 04/18/24 for the exam, diagnosis, procedures, and orders are all accurate and complete.   IF YOU HAVE BEEN REFERRED TO A SPECIALIST, IT MAY TAKE 1-2 WEEKS TO SCHEDULE/PROCESS THE REFERRAL. IF YOU HAVE NOT HEARD FROM US /SPECIALIST IN TWO WEEKS, PLEASE GIVE US  A CALL AT [phone].   THE PATIENT IS ENCOURAGED TO PRACTICE SOCIAL DISTANCING DUE TO THE COVID-19 PANDEMIC.

## 2024-04-18 NOTE — Assessment & Plan Note (Signed)
 She is encouraged to strive for BMI less than 30 to decrease cardiac risk. Advised to aim for at least 150 minutes of exercise per week.

## 2024-04-18 NOTE — Assessment & Plan Note (Addendum)
 Unfortunately, she has had two recent deaths in her family.  She does not wish to try any pharmaceutical meds at this time.  - Establish bedtime routine.  - Consider chamomile tea.

## 2024-04-18 NOTE — Assessment & Plan Note (Signed)
 Chronic, LDL goal is less than 70 .  Most recent Cardiology note reviewed. She will continue with rosuvastatin 20mg  M-Saturday. Encouraged to follow heart healthy lifestyle. Per patient report, advised by Cardiology to not take ASA 81mg  daily.

## 2024-04-18 NOTE — Assessment & Plan Note (Signed)
 Xyzal  ineffective. Prefers to avoid Zyrtec  and Allegra due to side effects. Considering low-dose Zyrtec . - Prescribed low-dose Zyrtec . - Sent prescription to [facility].

## 2024-04-18 NOTE — Assessment & Plan Note (Signed)
 Previous labs reviewed, her A1c has been elevated in the past. I will check an A1c today. Reminded to avoid refined sugars including sugary drinks/foods and processed meats including bacon, sausages and deli meats.

## 2024-04-18 NOTE — Patient Instructions (Signed)

## 2024-04-18 NOTE — Assessment & Plan Note (Signed)
 Elevated blood pressure likely due to stress and lack of sleep. Prefers no medication at this time. - Rechecked blood pressure. - Decrease sodium intake - Increase daily activity

## 2024-04-19 ENCOUNTER — Other Ambulatory Visit: Payer: Self-pay

## 2024-04-19 LAB — CMP14+EGFR
ALT: 9 IU/L (ref 0–32)
AST: 18 IU/L (ref 0–40)
Albumin: 4.3 g/dL (ref 3.9–4.9)
Alkaline Phosphatase: 97 IU/L (ref 49–135)
BUN/Creatinine Ratio: 13 (ref 12–28)
BUN: 11 mg/dL (ref 8–27)
Bilirubin Total: 0.5 mg/dL (ref 0.0–1.2)
CO2: 25 mmol/L (ref 20–29)
Calcium: 9.1 mg/dL (ref 8.7–10.3)
Chloride: 104 mmol/L (ref 96–106)
Creatinine, Ser: 0.88 mg/dL (ref 0.57–1.00)
Globulin, Total: 2.3 g/dL (ref 1.5–4.5)
Glucose: 80 mg/dL (ref 70–99)
Potassium: 4.3 mmol/L (ref 3.5–5.2)
Sodium: 142 mmol/L (ref 134–144)
Total Protein: 6.6 g/dL (ref 6.0–8.5)
eGFR: 72 mL/min/1.73 (ref >59)

## 2024-04-19 LAB — HEMOGLOBIN A1C
Est. average glucose Bld gHb Est-mCnc: 126 mg/dL
Hgb A1c MFr Bld: 6 % — ABNORMAL HIGH (ref 4.8–5.6)

## 2024-04-19 LAB — TSH+FREE T4
Free T4: 1.04 ng/dL (ref 0.82–1.77)
TSH: 3.05 u[IU]/mL (ref 0.450–4.500)

## 2024-04-20 ENCOUNTER — Ambulatory Visit: Payer: Self-pay | Admitting: Internal Medicine

## 2024-05-25 ENCOUNTER — Other Ambulatory Visit: Payer: Self-pay

## 2024-05-28 ENCOUNTER — Other Ambulatory Visit: Payer: Self-pay

## 2024-05-29 ENCOUNTER — Other Ambulatory Visit: Payer: Self-pay

## 2024-06-01 ENCOUNTER — Other Ambulatory Visit: Payer: Self-pay

## 2024-06-14 ENCOUNTER — Other Ambulatory Visit: Payer: Self-pay

## 2024-06-21 ENCOUNTER — Other Ambulatory Visit: Payer: Self-pay | Admitting: Obstetrics

## 2024-06-21 DIAGNOSIS — Z1231 Encounter for screening mammogram for malignant neoplasm of breast: Secondary | ICD-10-CM

## 2024-09-12 ENCOUNTER — Ambulatory Visit

## 2024-10-15 ENCOUNTER — Encounter: Payer: Self-pay | Admitting: Internal Medicine

## 2024-12-05 ENCOUNTER — Ambulatory Visit: Payer: Self-pay
